# Patient Record
Sex: Female | Born: 1945 | Race: White | Hispanic: No | Smoking: Former smoker
Health system: Southern US, Community
[De-identification: ages and names within clinical notes are randomized; demographics above are authoritative.]

## PROBLEM LIST (undated history)

## (undated) DIAGNOSIS — Z83518 Family history of other specified eye disorder: Secondary | ICD-10-CM

## (undated) DIAGNOSIS — E7800 Pure hypercholesterolemia, unspecified: Secondary | ICD-10-CM

## (undated) DIAGNOSIS — C182 Malignant neoplasm of ascending colon: Principal | ICD-10-CM

## (undated) DIAGNOSIS — F329 Major depressive disorder, single episode, unspecified: Secondary | ICD-10-CM

## (undated) DIAGNOSIS — G629 Polyneuropathy, unspecified: Secondary | ICD-10-CM

## (undated) DIAGNOSIS — E119 Type 2 diabetes mellitus without complications: Secondary | ICD-10-CM

## (undated) DIAGNOSIS — J449 Chronic obstructive pulmonary disease, unspecified: Secondary | ICD-10-CM

## (undated) DIAGNOSIS — F419 Anxiety disorder, unspecified: Secondary | ICD-10-CM

## (undated) DIAGNOSIS — E538 Deficiency of other specified B group vitamins: Secondary | ICD-10-CM

## (undated) DIAGNOSIS — D500 Iron deficiency anemia secondary to blood loss (chronic): Secondary | ICD-10-CM

## (undated) DIAGNOSIS — I2699 Other pulmonary embolism without acute cor pulmonale: Secondary | ICD-10-CM

## (undated) DIAGNOSIS — M1990 Unspecified osteoarthritis, unspecified site: Secondary | ICD-10-CM

## (undated) DIAGNOSIS — I10 Essential (primary) hypertension: Secondary | ICD-10-CM

## (undated) DIAGNOSIS — F32A Depression, unspecified: Secondary | ICD-10-CM

## (undated) DIAGNOSIS — C189 Malignant neoplasm of colon, unspecified: Secondary | ICD-10-CM

## (undated) HISTORY — PX: HERNIA REPAIR: SHX51

## (undated) HISTORY — DX: Malignant neoplasm of ascending colon: C18.2

## (undated) HISTORY — DX: Deficiency of other specified B group vitamins: E53.8

## (undated) HISTORY — DX: Iron deficiency anemia secondary to blood loss (chronic): D50.0

## (undated) HISTORY — PX: COLON SURGERY: SHX602

## (undated) HISTORY — DX: Malignant neoplasm of colon, unspecified: C18.9

---

## 2016-06-24 ENCOUNTER — Emergency Department (HOSPITAL_COMMUNITY): Payer: Medicare Other

## 2016-06-24 ENCOUNTER — Inpatient Hospital Stay (HOSPITAL_COMMUNITY)
Admission: EM | Admit: 2016-06-24 | Discharge: 2016-07-03 | DRG: 329 | Disposition: A | Discharge status: Home / Self Care | Payer: Medicare Other | Attending: Internal Medicine | Admitting: Internal Medicine

## 2016-06-24 ENCOUNTER — Encounter (HOSPITAL_COMMUNITY): Payer: Self-pay

## 2016-06-24 DIAGNOSIS — F17210 Nicotine dependence, cigarettes, uncomplicated: Secondary | ICD-10-CM | POA: Diagnosis present

## 2016-06-24 DIAGNOSIS — I2699 Other pulmonary embolism without acute cor pulmonale: Secondary | ICD-10-CM | POA: Diagnosis not present

## 2016-06-24 DIAGNOSIS — F419 Anxiety disorder, unspecified: Secondary | ICD-10-CM | POA: Diagnosis present

## 2016-06-24 DIAGNOSIS — I10 Essential (primary) hypertension: Secondary | ICD-10-CM | POA: Diagnosis not present

## 2016-06-24 DIAGNOSIS — C182 Malignant neoplasm of ascending colon: Secondary | ICD-10-CM | POA: Diagnosis present

## 2016-06-24 DIAGNOSIS — K6389 Other specified diseases of intestine: Secondary | ICD-10-CM

## 2016-06-24 DIAGNOSIS — E785 Hyperlipidemia, unspecified: Secondary | ICD-10-CM | POA: Diagnosis present

## 2016-06-24 DIAGNOSIS — Z01811 Encounter for preprocedural respiratory examination: Secondary | ICD-10-CM

## 2016-06-24 DIAGNOSIS — E7800 Pure hypercholesterolemia, unspecified: Secondary | ICD-10-CM | POA: Diagnosis present

## 2016-06-24 DIAGNOSIS — I2602 Saddle embolus of pulmonary artery with acute cor pulmonale: Secondary | ICD-10-CM | POA: Diagnosis not present

## 2016-06-24 DIAGNOSIS — K5730 Diverticulosis of large intestine without perforation or abscess without bleeding: Secondary | ICD-10-CM | POA: Diagnosis present

## 2016-06-24 DIAGNOSIS — K56609 Unspecified intestinal obstruction, unspecified as to partial versus complete obstruction: Secondary | ICD-10-CM

## 2016-06-24 DIAGNOSIS — R109 Unspecified abdominal pain: Secondary | ICD-10-CM | POA: Diagnosis present

## 2016-06-24 DIAGNOSIS — K5660 Unspecified intestinal obstruction: Secondary | ICD-10-CM | POA: Diagnosis not present

## 2016-06-24 HISTORY — DX: Pure hypercholesterolemia, unspecified: E78.00

## 2016-06-24 HISTORY — DX: Essential (primary) hypertension: I10

## 2016-06-24 LAB — CBC WITH DIFFERENTIAL/PLATELET
BASOS ABS: 0 10*3/uL (ref 0.0–0.1)
Basophils Relative: 0 %
EOS PCT: 0 %
Eosinophils Absolute: 0 10*3/uL (ref 0.0–0.7)
HEMATOCRIT: 37.9 % (ref 36.0–46.0)
Hemoglobin: 12 g/dL (ref 12.0–15.0)
LYMPHS ABS: 0.8 10*3/uL (ref 0.7–4.0)
LYMPHS PCT: 8 %
MCH: 26.1 pg (ref 26.0–34.0)
MCHC: 31.7 g/dL (ref 30.0–36.0)
MCV: 82.6 fL (ref 78.0–100.0)
MONO ABS: 1.3 10*3/uL — AB (ref 0.1–1.0)
MONOS PCT: 13 %
NEUTROS ABS: 8.4 10*3/uL — AB (ref 1.7–7.7)
Neutrophils Relative %: 79 %
PLATELETS: 443 10*3/uL — AB (ref 150–400)
RBC: 4.59 MIL/uL (ref 3.87–5.11)
RDW: 15.4 % (ref 11.5–15.5)
WBC: 10.6 10*3/uL — ABNORMAL HIGH (ref 4.0–10.5)

## 2016-06-24 LAB — URINALYSIS, ROUTINE W REFLEX MICROSCOPIC
Glucose, UA: NEGATIVE mg/dL
Hgb urine dipstick: NEGATIVE
KETONES UR: 15 mg/dL — AB
LEUKOCYTES UA: NEGATIVE
NITRITE: NEGATIVE
PROTEIN: 30 mg/dL — AB
Specific Gravity, Urine: 1.02 (ref 1.005–1.030)
pH: 5 (ref 5.0–8.0)

## 2016-06-24 LAB — COMPREHENSIVE METABOLIC PANEL
ALT: 10 U/L — ABNORMAL LOW (ref 14–54)
ANION GAP: 8 (ref 5–15)
AST: 14 U/L — AB (ref 15–41)
Albumin: 3.7 g/dL (ref 3.5–5.0)
Alkaline Phosphatase: 46 U/L (ref 38–126)
BILIRUBIN TOTAL: 0.9 mg/dL (ref 0.3–1.2)
BUN: 15 mg/dL (ref 6–20)
CALCIUM: 8.3 mg/dL — AB (ref 8.9–10.3)
CO2: 27 mmol/L (ref 22–32)
CREATININE: 0.75 mg/dL (ref 0.44–1.00)
Chloride: 97 mmol/L — ABNORMAL LOW (ref 101–111)
Glucose, Bld: 113 mg/dL — ABNORMAL HIGH (ref 65–99)
Potassium: 3.8 mmol/L (ref 3.5–5.1)
Sodium: 132 mmol/L — ABNORMAL LOW (ref 135–145)
TOTAL PROTEIN: 7 g/dL (ref 6.5–8.1)

## 2016-06-24 LAB — URINE MICROSCOPIC-ADD ON

## 2016-06-24 MED ORDER — HYDROMORPHONE HCL 1 MG/ML IJ SOLN
1.0000 mg | Freq: Once | INTRAMUSCULAR | Status: AC
  Administered 2016-06-24: 1 mg via INTRAVENOUS
  Filled 2016-06-24: qty 1

## 2016-06-24 MED ORDER — IOPAMIDOL (ISOVUE-300) INJECTION 61%
100.0000 mL | Freq: Once | INTRAVENOUS | Status: AC | PRN
  Administered 2016-06-24: 100 mL via INTRAVENOUS

## 2016-06-24 MED ORDER — BENZOCAINE 20 % MT AERO
INHALATION_SPRAY | Freq: Once | OROMUCOSAL | Status: AC
  Administered 2016-06-24: 1 via OROMUCOSAL
  Filled 2016-06-24: qty 57

## 2016-06-24 MED ORDER — SODIUM CHLORIDE 0.9 % IV BOLUS (SEPSIS)
1000.0000 mL | Freq: Once | INTRAVENOUS | Status: AC
  Administered 2016-06-24: 1000 mL via INTRAVENOUS

## 2016-06-24 MED ORDER — ONDANSETRON HCL 4 MG/2ML IJ SOLN
4.0000 mg | Freq: Once | INTRAMUSCULAR | Status: AC
  Administered 2016-06-24: 4 mg via INTRAVENOUS
  Filled 2016-06-24: qty 2

## 2016-06-24 NOTE — ED Notes (Signed)
Episode of emesis at this time.

## 2016-06-24 NOTE — ED Notes (Signed)
Pt's family very upset about wait time.  Pt calm and cooperative.  Daughter states they came here so they would not have to wait like they did in [place] last week.  Advised her of triage process and apologized for delay.

## 2016-06-24 NOTE — ED Triage Notes (Signed)
Pt reports abd pain and cramping x 3 weeks.  Says was treated at [facility] and was told she had an intestinal infection and was given flagyl, cipro, and zofran.  Pt says she took the antibiotics for 7 days.  Pt says she initially had diarrhea when she was seen at [place] but that stopped after taking the antibiotics.  Pt says she had a small BM today but says that was the first one she has had in over a week.

## 2016-06-24 NOTE — ED Notes (Signed)
Pt sitting in waiting area with daughter in no acute distress.  Apologized for delay.

## 2016-06-24 NOTE — ED Provider Notes (Signed)
[facility] DEPT Provider Note   CSN: [id number] Arrival date & time: 06/24/16  1524     History   Chief Complaint Chief Complaint  Patient presents with  . Emesis  . Abdominal Pain    HPI Janet Hanson is a 70 y.o. female.  She presents for evaluation of gradually worse pain and swelling in the abdomen, despite being treated for infection with Flagyl and Cipro. She has completed treatment. She was told that she had an infection after she had a CT scan about 2 weeks ago. She feels like the antibiotics helped her diarrhea and vomiting, but not the pain. She had a single episode of vomiting yesterday. She has only had 1 small bowel movement in the last week. No prior abdominal surgery. She denies change in her chronic cough, back pain, headache, focal weakness or paresthesia. There are no other known modifying factors.  HPI  Past Medical History:  Diagnosis Date  . Hypercholesterolemia   . Hypertension     There are no active problems to display for this patient.   History reviewed. No pertinent surgical history.  OB History    No data available       Home Medications    Prior to Admission medications   Medication Sig Start Date End Date Taking? Authorizing Provider  amLODipine (NORVASC) 5 MG tablet Take 5 mg by mouth daily.   Yes Historical Provider, MD  escitalopram (LEXAPRO) 10 MG tablet Take 10 mg by mouth daily.   Yes Historical Provider, MD  Omega-3 Fatty Acids (FISH OIL) 1200 MG CAPS Take 1 capsule by mouth daily.   Yes Historical Provider, MD  simvastatin (ZOCOR) 40 MG tablet Take 40 mg by mouth daily.   Yes Historical Provider, MD  ciprofloxacin (CIPRO) 500 MG tablet Take 500 mg by mouth 2 (two) times daily. 06/11/16   Historical Provider, MD  metroNIDAZOLE (FLAGYL) 500 MG tablet Take 500 mg by mouth 2 (two) times daily. 06/11/16   Historical Provider, MD    Family History No family history on file.  Social History Social History  Substance Use  Topics  . Smoking status: Current Every Day Smoker  . Smokeless tobacco: Never Used  . Alcohol use No     Allergies   Review of patient's allergies indicates no known allergies.   Review of Systems Review of Systems  All other systems reviewed and are negative.    Physical Exam Updated Vital Signs BP 112/80   Pulse 101   Temp 98.3 F (36.8 C) (Oral)   Resp 18   Ht 5\' 3"  (1.6 m)   Wt 155 lb (70.3 kg)   SpO2 93%   BMI 27.46 kg/m   Physical Exam  Constitutional: She is oriented to person, place, and time. She appears well-developed and well-nourished.  HENT:  Head: Normocephalic and atraumatic.  Right Ear: External ear normal.  Left Ear: External ear normal.  Eyes: Conjunctivae and EOM are normal. Pupils are equal, round, and reactive to light.  Neck: Normal range of motion and phonation normal. Neck supple.  Cardiovascular: Normal rate, regular rhythm and normal heart sounds.   Pulmonary/Chest: Effort normal and breath sounds normal. She exhibits no bony tenderness.  Abdominal: Soft. She exhibits distension. There is tenderness (Moderate, diffuse). There is no guarding.  Questionable firm hernia midline below the umbilicus, which is not reducible  Musculoskeletal: Normal range of motion.  Neurological: She is alert and oriented to person, place, and time. No cranial nerve deficit or  sensory deficit. She exhibits normal muscle tone. Coordination normal.  Skin: Skin is warm, dry and intact.  Psychiatric: She has a normal mood and affect. Her behavior is normal. Judgment and thought content normal.  Nursing note and vitals reviewed.    ED Treatments / Results  Labs (all labs ordered are listed, but only abnormal results are displayed) Labs Reviewed  CBC WITH DIFFERENTIAL/PLATELET - Abnormal; Notable for the following:       Result Value   WBC 10.6 (*)    Platelets 443 (*)    Neutro Abs 8.4 (*)    Monocytes Absolute 1.3 (*)    All other components within normal  limits  COMPREHENSIVE METABOLIC PANEL - Abnormal; Notable for the following:    Sodium 132 (*)    Chloride 97 (*)    Glucose, Bld 113 (*)    Calcium 8.3 (*)    AST 14 (*)    ALT 10 (*)    All other components within normal limits  URINALYSIS, ROUTINE W REFLEX MICROSCOPIC (NOT AT [facility])    EKG  EKG Interpretation None       Radiology Ct Abdomen Pelvis W Contrast  Result Date: 06/24/2016 CLINICAL DATA:  Patient with abdominal pain and cramping for 3 weeks. EXAM: CT ABDOMEN AND PELVIS WITH CONTRAST TECHNIQUE: Multidetector CT imaging of the abdomen and pelvis was performed using the standard protocol following bolus administration of intravenous contrast. CONTRAST:  159mL ISOVUE-300 IOPAMIDOL (ISOVUE-300) INJECTION 61% COMPARISON:  None. FINDINGS: Lower chest: Normal heart size. Scarring and or atelectasis within the lower lobes bilaterally. No pleural effusion. Small hiatal hernia. Hepatobiliary: Liver is normal in size and contour. No focal hepatic lesions identified. Gallbladder is unremarkable. Pancreas: Unremarkable Spleen: Unremarkable Adrenals/Urinary Tract: The adrenal glands are normal. Kidneys enhance symmetrically with contrast. No hydronephrosis. Urinary bladder is unremarkable. Stomach/Bowel: Sigmoid colonic diverticulosis. 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 There is a 5.1 x 3.9 cm annular constricting mass involving the mid aspect of the ascending colon (image 66; series 5). There is an adjacent small foci of gas and small amount fluid within the colonic mesentery (image 52; series 7). There is dilatation of the cecum and upstream small bowel which is fluid and gas filled measuring up to 4 cm. Vascular/Lymphatic: Normal caliber abdominal aorta. Peripheral calcified atherosclerotic plaque. No retroperitoneal lymphadenopathy. Other: Uterus and adnexal structures are unremarkable. Musculoskeletal: Lumbar spine degenerative  changes. No aggressive or acute appearing osseous lesions. IMPRESSION: There is a large annular constricting mass involving the mid aspect of the ascending colon most compatible with colonic carcinoma. There is a small adjacent foci of gas and free fluid raising the possibility of micro perforation. There is marked dilatation of the cecum and small bowel with fluid and gas most compatible with proximal obstruction caused by the large ascending colonic mass. Additionally there is a 6 cm fatty mass involving the colon at the level the hepatic flexure, most suggestive of a lipoma. Aortic vascular calcifications. Sigmoid colonic diverticulosis without evidence for acute diverticulitis. Electronically Signed   By: Lovey Newcomer M.D.   On: 06/24/2016 21:04    Procedures Procedures (including critical care time)  Medications Ordered in ED Medications  sodium chloride 0.9 % bolus 1,000 mL (0 mLs Intravenous Stopped 06/24/16 2019)  HYDROmorphone (DILAUDID) injection 1 mg (1 mg Intravenous Given 06/24/16 1928)  ondansetron (ZOFRAN) injection 4 mg (4 mg Intravenous Given 06/24/16 1928)  iopamidol (ISOVUE-300)  61 % injection 100 mL (100 mLs Intravenous Contrast Given 06/24/16 2039)     Initial Impression / Assessment and Plan / ED Course  I have reviewed the triage vital signs and the nursing notes.  Pertinent labs & imaging results that were available during my care of the patient were reviewed by me and considered in my medical decision making (see chart for details).  Clinical Course  Value Comment By Time  Creatinine: 0.75 (Reviewed) Daleen Bo, MD 08/22 2201    Medications  sodium chloride 0.9 % bolus 1,000 mL (0 mLs Intravenous Stopped 06/24/16 2019)  HYDROmorphone (DILAUDID) injection 1 mg (1 mg Intravenous Given 06/24/16 1928)  ondansetron (ZOFRAN) injection 4 mg (4 mg Intravenous Given 06/24/16 1928)  iopamidol (ISOVUE-300) 61 % injection 100 mL (100 mLs Intravenous Contrast Given 06/24/16 2039)      Patient Vitals for the past 24 hrs:  BP Temp Temp src Pulse Resp SpO2 Height Weight  06/24/16 2000 112/80 - - 101 18 93 % - -  06/24/16 1930 99/68 - - 115 20 93 % - -  06/24/16 1533 - - - - - - 5\' 3"  (1.6 m) 155 lb (70.3 kg)  06/24/16 1532 133/74 98.3 F (36.8 C) Oral 117 20 97 % - -   Documentation received from [facility], CT scan on 06/24/2016 revealed asendingcolonic abnormality, suspicious for "underlying malignancy". Direct visualization, was recommended.  21:50- discussed with general surgery, Dr. Rosana Hoes, he will evaluate the patient tomorrow in the hospital. He recommends that the patient be admitted to the hospitalist and have an NG placed, for decompression.  10:02 PM Reevaluation with update and discussion. After initial assessment and treatment, an updated evaluation reveals no additional complaints. Findings discussed with patient and family member, all questions answered. Janet Hanson   10:07 PM-Consult complete with Dr. Marin Comment. Patient case explained and discussed. He agrees to admit patient for further evaluation and treatment. 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 No evidence for acute infectious process at this time. Microperforation, you the mass, possibly present, does not require urgent surgical intervention at this time.  Nursing Notes Reviewed/ Care Coordinated, and agree without changes. Applicable Imaging Reviewed.  Interpretation of Laboratory Data incorporated into ED treatment   Plan: Admit  New Prescriptions New Prescriptions   No medications on file     Daleen Bo, MD 06/24/16 2229

## 2016-06-24 NOTE — H&P (Signed)
History and Physical    Janet Hanson [id number] DOB: 06-27-1946 DOA: 06/24/2016  Referring MD/NP/PA: Christ Kick, MD PCP: No PCP Per Patient  Outpatient Specialists:   Patient coming from: home  Chief Complaint: abdominal pain and vomiting  HPI: Janet Hanson is a 70 y.o. female who presents to the emergency department with complaints of worsening abdominal pain. She was told that she had an infection about 2 weeks ago, and was given Flagyl and Cipro. She feels as though the antibiotics helped her diarrhea and vomiting, though the pain has persisted. She reports having only had a single bowel movement in the past week. She denies any other new onset symptoms.  ED Course: while in the ED, CBC, CMP, were largely unremarkable. CT scan showed showed a large annular constricting mass involving mid aspect of ascending colon most compatible with colonic carcinoma. Fortunately, no metastasis to the liver or surrounding lymph nodes was noted. Dilatation of the cecum and small bowel with fluid and gas, compatable with proximal obstruction was also noted. General surgery consult was called. Patient will be referred for GI consult and further evaluation.   Review of Systems: As per HPI otherwise 10 point review of systems negative.    Past Medical History:  Diagnosis Date  . Hypercholesterolemia   . Hypertension     History reviewed. No pertinent surgical history.   reports that she has been smoking.  She has never used smokeless tobacco. She reports that she does not drink alcohol or use drugs.  No Known Allergies  No family history on file.  Prior to Admission medications   Medication Sig Start Date End Date Taking? Authorizing Provider  amLODipine (NORVASC) 5 MG tablet Take 5 mg by mouth daily.   Yes Historical Provider, MD  escitalopram (LEXAPRO) 10 MG tablet Take 10 mg by mouth daily.   Yes Historical Provider, MD  Omega-3 Fatty Acids (FISH OIL) 1200 MG CAPS Take 1 capsule  by mouth daily.   Yes Historical Provider, MD  simvastatin (ZOCOR) 40 MG tablet Take 40 mg by mouth daily.   Yes Historical Provider, MD  ciprofloxacin (CIPRO) 500 MG tablet Take 500 mg by mouth 2 (two) times daily. 06/11/16   Historical Provider, MD  metroNIDAZOLE (FLAGYL) 500 MG tablet Take 500 mg by mouth 2 (two) times daily. 06/11/16   Historical Provider, MD    Physical Exam: Vitals:   06/24/16 1532 06/24/16 1533 06/24/16 1930 06/24/16 2000  BP: 133/74  99/68 112/80  Pulse: 117  115 101  Resp: 20  20 18   Temp: 98.3 F (36.8 C)     TempSrc: Oral     SpO2: 97%  93% 93%  Weight:  70.3 kg (155 lb)    Height:  5\' 3"  (1.6 m)     Constitutional: NAD, calm, comfortable Vitals:   06/24/16 1532 06/24/16 1533 06/24/16 1930 06/24/16 2000  BP: 133/74  99/68 112/80  Pulse: 117  115 101  Resp: 20  20 18   Temp: 98.3 F (36.8 C)     TempSrc: Oral     SpO2: 97%  93% 93%  Weight:  70.3 kg (155 lb)    Height:  5\' 3"  (1.6 m)     Eyes: PERRL, lids and conjunctivae normal ENMT: Mucous membranes are moist. Posterior pharynx clear of any exudate or lesions.Normal dentition.  Neck: normal, supple, no masses, no thyromegaly Respiratory: clear to auscultation bilaterally, no wheezing, no crackles. Normal respiratory effort. No accessory muscle use.  Cardiovascular:  Regular rate and rhythm, no murmurs / rubs / gallops. No extremity edema. 2+ pedal pulses. No carotid bruits.  Abdomen: no tenderness, no masses palpated. No hepatosplenomegaly. Bowel sounds positive Musculoskeletal: no clubbing / cyanosis. No joint deformity upper and lower extremities. Good ROM, no contractures. Normal muscle tone.  Skin: no rashes, lesions, ulcers. No induration Neurologic: CN 2-12 grossly intact. Sensation intact, DTR normal. Strength 5/5 in all 4.  Psychiatric: Normal judgment and insight. Alert and oriented x 3. Normal mood.   Labs on Admission: I have personally reviewed following labs and imaging  studies  CBC:  Recent Labs Lab 06/24/16 1543  WBC 10.6*  NEUTROABS 8.4*  HGB 12.0  HCT 37.9  MCV 82.6  PLT 123456*   Basic Metabolic Panel:  Recent Labs Lab 06/24/16 1543  NA 132*  K 3.8  CL 97*  CO2 27  GLUCOSE 113*  BUN 15  CREATININE 0.75  CALCIUM 8.3*    Liver Function Tests:  Recent Labs Lab 06/24/16 1543  AST 14*  ALT 10*  ALKPHOS 46  BILITOT 0.9  PROT 7.0  ALBUMIN 3.7   Radiological Exams on Admission: Ct Abdomen Pelvis W Contrast  Result Date: 06/24/2016 CLINICAL DATA:  Patient with abdominal pain and cramping for 3 weeks. EXAM: CT ABDOMEN AND PELVIS WITH CONTRAST TECHNIQUE: Multidetector CT imaging of the abdomen and pelvis was performed using the standard protocol following bolus administration of intravenous contrast. CONTRAST:  181mL ISOVUE-300 IOPAMIDOL (ISOVUE-300) INJECTION 61% COMPARISON:  None. FINDINGS: Lower chest: Normal heart size. Scarring and or atelectasis within the lower lobes bilaterally. No pleural effusion. Small hiatal hernia. Hepatobiliary: Liver is normal in size and contour. No focal hepatic lesions identified. Gallbladder is unremarkable. Pancreas: Unremarkable Spleen: Unremarkable Adrenals/Urinary Tract: The adrenal glands are normal. Kidneys enhance symmetrically with contrast. No hydronephrosis. Urinary bladder is unremarkable. Stomach/Bowel: Sigmoid colonic diverticulosis. 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 There is a 5.1 x 3.9 cm annular constricting mass involving the mid aspect of the ascending colon (image 66; series 5). There is an adjacent small foci of gas and small amount fluid within the colonic mesentery (image 52; series 7). There is dilatation of the cecum and upstream small bowel which is fluid and gas filled measuring up to 4 cm. Vascular/Lymphatic: Normal caliber abdominal aorta. Peripheral calcified atherosclerotic plaque. No retroperitoneal lymphadenopathy.  Other: Uterus and adnexal structures are unremarkable. Musculoskeletal: Lumbar spine degenerative changes. No aggressive or acute appearing osseous lesions. IMPRESSION: There is a large annular constricting mass involving the mid aspect of the ascending colon most compatible with colonic carcinoma. There is a small adjacent foci of gas and free fluid raising the possibility of micro perforation. There is marked dilatation of the cecum and small bowel with fluid and gas most compatible with proximal obstruction caused by the large ascending colonic mass. Additionally there is a 6 cm fatty mass involving the colon at the level the hepatic flexure, most suggestive of a lipoma. Aortic vascular calcifications. Sigmoid colonic diverticulosis without evidence for acute diverticulitis. Electronically Signed   By: Lovey Newcomer M.D.   On: 06/24/2016 21:04   Assessment/Plan  1. Colonic mass, suspicious for carcinoma. Large mass noted in ascending colon on CT A/P. She has not had any prior colonoscopy, and has not had any symptoms prior to this hospitalization. Patient recently had a scan in [place], will obtain records to evaluate description of prior mass.  No evidence  of metastasis to liver or surrounding lymph nodes. Will consult to GI for colonoscopy and biopsy.  Will also consult general surgery. 2. SBO. CT a/p findings consistent with proximal obstruction caused by ascending colonic mass. Will place NG tube to allow for NG tube. Will keep NPO. GI will be consulted for colonoscopy. 3. Sigmoid diverticulosis without evidence for acute diverticulitis. 4. Tobacco use. Will provide nicotine patch PRN. 5. Axiety:  Will give PRN Valium.   DVT prophylaxis: Heparin Code Status: Full Family Communication: discussed with patient and family present at bedside. Disposition Plan: admit as inpatient. Discharge home once improved Consults called: General surgery, GI Admission status: admit as inpatient.   Ocie Doyne, MD  FACP [facility]  If 7PM-7AM, please contact night-coverage [email or web address] Password [email or web address]  06/24/2016, 10:23 PM   By signing my name below, I, Delene Ruffini, attest that this documentation has been prepared under the direction and in the presence of Orvan Falconer, MD. Electronically Signed: Delene Ruffini, Scribe 06/24/16 10:30pm

## 2016-06-24 NOTE — ED Notes (Signed)
Patient returned from radiology

## 2016-06-25 ENCOUNTER — Encounter (HOSPITAL_COMMUNITY): Payer: Self-pay | Admitting: *Deleted

## 2016-06-25 ENCOUNTER — Inpatient Hospital Stay (HOSPITAL_COMMUNITY): Payer: Medicare Other

## 2016-06-25 DIAGNOSIS — K5660 Unspecified intestinal obstruction: Secondary | ICD-10-CM

## 2016-06-25 DIAGNOSIS — K6389 Other specified diseases of intestine: Secondary | ICD-10-CM

## 2016-06-25 LAB — SURGICAL PCR SCREEN
MRSA, PCR: NEGATIVE
STAPHYLOCOCCUS AUREUS: NEGATIVE

## 2016-06-25 MED ORDER — SIMVASTATIN 20 MG PO TABS: 40.0000 mg | ORAL_TABLET | Freq: Every day | ORAL | Status: DC

## 2016-06-25 MED ORDER — HEPARIN SODIUM (PORCINE) 5000 UNIT/ML IJ SOLN
5000.0000 [IU] | Freq: Three times a day (TID) | INTRAMUSCULAR | Status: DC
  Administered 2016-06-25: 5000 [IU] via SUBCUTANEOUS
  Filled 2016-06-25: qty 1

## 2016-06-25 MED ORDER — MORPHINE SULFATE (PF) 2 MG/ML IV SOLN
1.0000 mg | INTRAVENOUS | Status: DC | PRN
  Administered 2016-06-25 – 2016-06-28 (×11): 1 mg via INTRAVENOUS
  Filled 2016-06-25 (×11): qty 1

## 2016-06-25 MED ORDER — ATORVASTATIN CALCIUM 20 MG PO TABS: 20.0000 mg | ORAL_TABLET | Freq: Every day | ORAL | Status: DC

## 2016-06-25 MED ORDER — CETYLPYRIDINIUM CHLORIDE 0.05 % MT LIQD
7.0000 mL | Freq: Two times a day (BID) | OROMUCOSAL | Status: DC
  Administered 2016-06-25 (×2): 7 mL via OROMUCOSAL

## 2016-06-25 MED ORDER — ENOXAPARIN SODIUM 40 MG/0.4ML ~~LOC~~ SOLN
40.0000 mg | SUBCUTANEOUS | Status: DC
  Administered 2016-06-25 – 2016-06-26 (×2): 40 mg via SUBCUTANEOUS
  Filled 2016-06-25 (×2): qty 0.4

## 2016-06-25 MED ORDER — METOPROLOL TARTRATE 5 MG/5ML IV SOLN
5.0000 mg | Freq: Two times a day (BID) | INTRAVENOUS | Status: DC
  Administered 2016-06-25 – 2016-07-03 (×15): 5 mg via INTRAVENOUS
  Filled 2016-06-25 (×15): qty 5

## 2016-06-25 MED ORDER — DEXTROSE-NACL 5-0.9 % IV SOLN
INTRAVENOUS | Status: DC
  Administered 2016-06-25 – 2016-06-26 (×5): via INTRAVENOUS

## 2016-06-25 MED ORDER — CHLORHEXIDINE GLUCONATE 0.12 % MT SOLN
15.0000 mL | Freq: Two times a day (BID) | OROMUCOSAL | Status: DC
  Administered 2016-06-25 – 2016-06-29 (×7): 15 mL via OROMUCOSAL
  Filled 2016-06-25 (×9): qty 15

## 2016-06-25 MED ORDER — DIAZEPAM 5 MG PO TABS: 5.0000 mg | ORAL_TABLET | Freq: Three times a day (TID) | ORAL | Status: DC | PRN

## 2016-06-25 MED ORDER — CALCIUM CARBONATE ANTACID 500 MG PO CHEW
2.0000 | CHEWABLE_TABLET | Freq: Three times a day (TID) | ORAL | Status: DC | PRN
  Filled 2016-06-25: qty 2

## 2016-06-25 MED ORDER — AMLODIPINE BESYLATE 5 MG PO TABS: 5.0000 mg | ORAL_TABLET | Freq: Every day | ORAL | Status: DC

## 2016-06-25 MED ORDER — ESCITALOPRAM OXALATE 10 MG PO TABS: 10.0000 mg | ORAL_TABLET | Freq: Every day | ORAL | Status: DC

## 2016-06-25 NOTE — Consult Note (Signed)
Reason for Consult:Colonic mass Referring Physician: Hospitalist Patient at [facility] in [place]. She is seen every 6 months.  She lives in [place]  and lives by herself. Her husband is deceased.  Retired from [employer]. Janet Hanson is an 70 y.o. female.  HPI:  Admitted thru the ED last night. She c/o abdominal cramps which she describes as all over her abdomen. She has been sick for 3 weeks.  She also c/o diarrhea and vomiting. Seen at [facility]  07/11/2016. She said she underwent a CXR and CT and was told she had an intestinal infection. She was given Cipro and Flagyl x 7 days. Her symptoms did not get better. She presented to the ED at [facility]. She says she has lost about 7 pounds. She has not been able to eat. CT scan here at [facility] revealed a colonic mass.  She says she cannot have a BM. Last BM was yesterday and very small. She denies any rectal bleeding.  She does not have an appetite. NG tube is in place.  No family hx of colon cancer. She has never had a colonoscopy.    Past Medical History:  Diagnosis Date  . Hypercholesterolemia   . Hypertension     History reviewed. No pertinent surgical history.  History reviewed. No pertinent family history.  Social History:  reports that she has been smoking.  She has never used smokeless tobacco. She reports that she does not drink alcohol or use drugs.  Allergies: No Known Allergies  Medications: I have reviewed the patient's current medications.  Results for orders placed or performed during the hospital encounter of 06/24/16 (from the past 48 hour(s))  CBC with Differential     Status: Abnormal   Collection Time: 06/24/16  3:43 PM  Result Value Ref Range   WBC 10.6 (H) 4.0 - 10.5 K/uL   RBC 4.59 3.87 - 5.11 MIL/uL   Hemoglobin 12.0 12.0 - 15.0 g/dL   HCT 37.9 36.0 - 46.0 %   MCV 82.6 78.0 - 100.0 fL   MCH 26.1 26.0 - 34.0 pg   MCHC 31.7 30.0 - 36.0 g/dL   RDW 15.4 11.5 - 15.5 %   Platelets 443 (H) 150 - 400  K/uL   Neutrophils Relative % 79 %   Neutro Abs 8.4 (H) 1.7 - 7.7 K/uL   Lymphocytes Relative 8 %   Lymphs Abs 0.8 0.7 - 4.0 K/uL   Monocytes Relative 13 %   Monocytes Absolute 1.3 (H) 0.1 - 1.0 K/uL   Eosinophils Relative 0 %   Eosinophils Absolute 0.0 0.0 - 0.7 K/uL   Basophils Relative 0 %   Basophils Absolute 0.0 0.0 - 0.1 K/uL  Comprehensive metabolic panel     Status: Abnormal   Collection Time: 06/24/16  3:43 PM  Result Value Ref Range   Sodium 132 (L) 135 - 145 mmol/L   Potassium 3.8 3.5 - 5.1 mmol/L   Chloride 97 (L) 101 - 111 mmol/L   CO2 27 22 - 32 mmol/L   Glucose, Bld 113 (H) 65 - 99 mg/dL   BUN 15 6 - 20 mg/dL   Creatinine, Ser 0.75 0.44 - 1.00 mg/dL   Calcium 8.3 (L) 8.9 - 10.3 mg/dL   Total Protein 7.0 6.5 - 8.1 g/dL   Albumin 3.7 3.5 - 5.0 g/dL   AST 14 (L) 15 - 41 U/L   ALT 10 (L) 14 - 54 U/L   Alkaline Phosphatase 46 38 - 126 U/L  Total Bilirubin 0.9 0.3 - 1.2 mg/dL   GFR calc non Af Amer >60 >60 mL/min   GFR calc Af Amer >60 >60 mL/min    Comment: (NOTE) The eGFR has been calculated using the CKD EPI equation. This calculation has not been validated in all clinical situations. eGFR's persistently <60 mL/min signify possible Chronic Kidney Disease.    Anion gap 8 5 - 15  Urinalysis, Routine w reflex microscopic (not at [facility])     Status: Abnormal   Collection Time: 06/24/16 10:26 PM  Result Value Ref Range   Color, Urine AMBER (A) YELLOW    Comment: BIOCHEMICALS MAY BE AFFECTED BY COLOR   APPearance CLEAR CLEAR   Specific Gravity, Urine 1.020 1.005 - 1.030   pH 5.0 5.0 - 8.0   Glucose, UA NEGATIVE NEGATIVE mg/dL   Hgb urine dipstick NEGATIVE NEGATIVE   Bilirubin Urine SMALL (A) NEGATIVE   Ketones, ur 15 (A) NEGATIVE mg/dL   Protein, ur 30 (A) NEGATIVE mg/dL   Nitrite NEGATIVE NEGATIVE   Leukocytes, UA NEGATIVE NEGATIVE  Urine microscopic-add on     Status: Abnormal   Collection Time: 06/24/16 10:26 PM  Result Value Ref Range   Squamous  Epithelial / LPF 6-30 (A) NONE SEEN   WBC, UA 0-5 0 - 5 WBC/hpf   RBC / HPF 0-5 0 - 5 RBC/hpf   Bacteria, UA RARE (A) NONE SEEN   Crystals CA OXALATE CRYSTALS (A) NEGATIVE    Ct Abdomen Pelvis W Contrast  Result Date: 06/24/2016 CLINICAL DATA:  Patient with abdominal pain and cramping for 3 weeks. EXAM: CT ABDOMEN AND PELVIS WITH CONTRAST TECHNIQUE: Multidetector CT imaging of the abdomen and pelvis was performed using the standard protocol following bolus administration of intravenous contrast. CONTRAST:  151m ISOVUE-300 IOPAMIDOL (ISOVUE-300) INJECTION 61% COMPARISON:  None. FINDINGS: Lower chest: Normal heart size. Scarring and or atelectasis within the lower lobes bilaterally. No pleural effusion. Small hiatal hernia. Hepatobiliary: Liver is normal in size and contour. No focal hepatic lesions identified. Gallbladder is unremarkable. Pancreas: Unremarkable Spleen: Unremarkable Adrenals/Urinary Tract: The adrenal glands are normal. Kidneys enhance symmetrically with contrast. No hydronephrosis. Urinary bladder is unremarkable. Stomach/Bowel: Sigmoid colonic diverticulosis. 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 There is a 5.1 x 3.9 cm annular constricting mass involving the mid aspect of the ascending colon (image 66; series 5). There is an adjacent small foci of gas and small amount fluid within the colonic mesentery (image 52; series 7). There is dilatation of the cecum and upstream small bowel which is fluid and gas filled measuring up to 4 cm. Vascular/Lymphatic: Normal caliber abdominal aorta. Peripheral calcified atherosclerotic plaque. No retroperitoneal lymphadenopathy. Other: Uterus and adnexal structures are unremarkable. Musculoskeletal: Lumbar spine degenerative changes. No aggressive or acute appearing osseous lesions. IMPRESSION: There is a large annular constricting mass involving the mid aspect of the ascending colon most  compatible with colonic carcinoma. There is a small adjacent foci of gas and free fluid raising the possibility of micro perforation. There is marked dilatation of the cecum and small bowel with fluid and gas most compatible with proximal obstruction caused by the large ascending colonic mass. Additionally there is a 6 cm fatty mass involving the colon at the level the hepatic flexure, most suggestive of a lipoma. Aortic vascular calcifications. Sigmoid colonic diverticulosis without evidence for acute diverticulitis. Electronically Signed   By: DLovey NewcomerM.D.   On: 06/24/2016  21:04    ROS Blood pressure 120/67, pulse 96, temperature 98.3 F (36.8 C), temperature source Oral, resp. rate 16, height _0  (1.6 m), weight 157 lb 14.4 oz (71.6 kg), SpO2 93 %. Physical Exam Alert and oriented. Skin warm and dry. Oral mucosa is moist.   . Sclera anicteric, conjunctivae is pink. Thyroid not enlarged. No cervical lymphadenopathy. Lungs clear. Heart regular rate and rhythm.  Abdomen is soft. Bowel sounds are positive( sluggish). No hepatomegaly. No abdominal masses felt. Diffuse abdominal tenderness.  No edema to lower extremities. NG tube in place.  Assessment/Plan: Colonic mass.  I discussed with Dr. Laural Golden. Recommend: Surgical consult. ER records from [facility] (requested) Recommend IV Cipro and Flagyl.   Brylei Pedley W 06/25/2016, 7:41 AM

## 2016-06-25 NOTE — Care Management Important Message (Signed)
Important Message  Patient Details  Name: Janet Hanson MRN: [id number] Date of Birth: May 12, 1946   Medicare Important Message Given:  Yes    Sherald Barge, RN 06/25/2016, 10:01 AM

## 2016-06-25 NOTE — Anesthesia Preprocedure Evaluation (Addendum)
Anesthesia Evaluation  Patient identified by MRN, date of birth, ID band Patient awake    Reviewed: Allergy & Precautions, NPO status , Patient's Chart, lab work & pertinent test results  Airway Mallampati: III  TM Distance: >3 FB Neck ROM: Full    Dental  (+) Missing, Poor Dentition   Pulmonary Current Smoker,    breath sounds clear to auscultation + decreased breath sounds      Cardiovascular hypertension, Pt. on medications Normal cardiovascular exam Rhythm:Regular Rate:Normal     Neuro/Psych Anxiety negative neurological ROS     GI/Hepatic Neg liver ROS, Obstructing ascending colon mass   Endo/Other  Hypercholesterolemia  Renal/GU negative Renal ROS  negative genitourinary   Musculoskeletal negative musculoskeletal ROS (+)   Abdominal   Peds  Hematology negative hematology ROS (+)   Anesthesia Other Findings   Reproductive/Obstetrics                            Lab Results  Component Value Date   WBC 10.6 (H) 06/24/2016   HGB 12.0 06/24/2016   HCT 37.9 06/24/2016   MCV 82.6 06/24/2016   PLT 443 (H) 06/24/2016     Chemistry      Component Value Date/Time   NA 132 (L) 06/24/2016 1543   K 3.8 06/24/2016 1543   CL 97 (L) 06/24/2016 1543   CO2 27 06/24/2016 1543   BUN 15 06/24/2016 1543   CREATININE 0.75 06/24/2016 1543      Component Value Date/Time   CALCIUM 8.3 (L) 06/24/2016 1543   ALKPHOS 46 06/24/2016 1543   AST 14 (L) 06/24/2016 1543   ALT 10 (L) 06/24/2016 1543   BILITOT 0.9 06/24/2016 1543     EKG: 06/25/2016 Sinus tachycardia, poor r wave progression in V leads.  Anesthesia Physical Anesthesia Plan  ASA: II  Anesthesia Plan: General   Post-op Pain Management:    Induction: Intravenous  Airway Management Planned: Oral ETT  Additional Equipment:   Intra-op Plan:   Post-operative Plan: Extubation in OR  Informed Consent: I have reviewed the patients  History and Physical, chart, labs and discussed the procedure including the risks, benefits and alternatives for the proposed anesthesia with the patient or authorized representative who has indicated his/her understanding and acceptance.   Dental advisory given  Plan Discussed with: Anesthesiologist, CRNA and Surgeon  Anesthesia Plan Comments:         Anesthesia Quick Evaluation

## 2016-06-25 NOTE — Care Management Note (Signed)
Case Management Note  Patient Details  Name: Janet Hanson MRN: [id number] Date of Birth: 12/16/1945  Subjective/Objective:                  Patient is from home. She lives alone and is ind with ADL's. She has no DME PTA. She has PCP, drives herself to appointments. She has insurance with drug coverage and has no difficulty affording medications. She plans to return home with self care.   Action/Plan: No CM needs anticipated.  Expected Discharge Date:  07/01/16               Expected Discharge Plan:  Home/Self Care  In-House Referral:  NA  Discharge planning Services  CM Consult  Post Acute Care Choice:  NA Choice offered to:  NA  DME Arranged:    DME Agency:     HH Arranged:    HH Agency:     Status of Service:  Completed, signed off  If discussed at [employer] of Stay Meetings, dates discussed:    Additional Comments:  Sherald Barge, RN 06/25/2016, 10:02 AM

## 2016-06-25 NOTE — Consult Note (Addendum)
SURGICAL CONSULTATION NOTE (initial)  HISTORY OF PRESENT ILLNESS (HPI):  70 y.o. female presented with worsening of abdominal pain, distention, and nausea and vomiting x 2-3 days. 3 weeks ago, she reportedly presented to [facility] with diarrhea and vomiting, for which a CT of her abdomen was performed, after which she was discharged home with Cipro and Flagyl for gastroenteritis. Since then, she continued to have diarrhea x 2 days and then stopped passing diarrhea, flatus, or bowel movements entirely and began having worsening abdominal pain and distention. 2 days ago, she again began vomiting and presented to [facility] ED. CT was performed, NG tube was inserted for decompression, and patient reports resolution of abdominal pain and nausea with less distention. Patient denies ever having had a colonoscopy. Despite smoking, patient denies CP or SOB with exertion, such as riding a bicycle with her grandchildren.   Surgery is consulted by ED physician Dr. Eulis Foster and medical physician Dr. Marin Comment in this context for evaluation and management of obstructing Right colon mass.  PAST MEDICAL HISTORY (PMH):  Past Medical History:  Diagnosis Date  . Hypercholesterolemia   . Hypertension      PAST SURGICAL HISTORY ([place]):  History reviewed. No pertinent surgical history.   MEDICATIONS:  Prior to Admission medications   Medication Sig Start Date End Date Taking? Authorizing Provider  amLODipine (NORVASC) 5 MG tablet Take 5 mg by mouth daily.   Yes Historical Provider, MD  escitalopram (LEXAPRO) 10 MG tablet Take 10 mg by mouth daily.   Yes Historical Provider, MD  Omega-3 Fatty Acids (FISH OIL) 1200 MG CAPS Take 1 capsule by mouth daily.   Yes Historical Provider, MD  simvastatin (ZOCOR) 40 MG tablet Take 40 mg by mouth daily.   Yes Historical Provider, MD  ciprofloxacin (CIPRO) 500 MG tablet Take 500 mg by mouth 2 (two) times daily. 06/11/16   Historical Provider, MD  metroNIDAZOLE (FLAGYL) 500 MG tablet  Take 500 mg by mouth 2 (two) times daily. 06/11/16   Historical Provider, MD     ALLERGIES:  No Known Allergies   SOCIAL HISTORY:  Social History   Social History  . Marital status: Widowed    Spouse name: N/A  . Number of children: N/A  . Years of education: N/A   Occupational History  . Not on file.   Social History Main Topics  . Smoking status: Current Every Day Smoker    Packs/day: 0.50    Years: 48.00    Types: Cigarettes  . Smokeless tobacco: Never Used  . Alcohol use No  . Drug use: No  . Sexual activity: Not on file   Other Topics Concern  . Not on file   Social History Narrative  . No narrative on file    The patient currently resides (home / rehab facility / nursing home): Home  The patient normally is (ambulatory / bedbound): Ambulatory   FAMILY HISTORY:  History reviewed. No pertinent family history, including no known history of GI/colon cancer.  REVIEW OF SYSTEMS:  Constitutional: denies weight loss, fever, chills, or sweats  Eyes: denies any other vision changes, history of eye injury  ENT: denies sore throat, hearing problems  Respiratory: denies shortness of breath, wheezing  Cardiovascular: denies chest pain, palpitations  Gastrointestinal: abdominal pain, distention, N/V, and bowel function as per HPI Musculoskeletal: denies any other joint pains or cramps  Skin: denies any other rashes or skin discolorations  Neurological: denies any other headache, dizziness, weakness  Psychiatric: denies any other depression,  anxiety   All other review of systems were negative   VITAL SIGNS:  Temp:  [97.3 F (36.3 C)-98.3 F (36.8 C)] 98.3 F (36.8 C) (08/23 0500) Pulse Rate:  [96-117] 96 (08/23 0500) Resp:  [15-20] 16 (08/23 0500) BP: (99-136)/(67-80) 120/67 (08/23 0500) SpO2:  [90 %-97 %] 93 % (08/23 0500) Weight:  [70.3 kg (155 lb)-71.6 kg (157 lb 14.4 oz)] 71.6 kg (157 lb 14.4 oz) (08/23 0004)     Height: 5\' 3"  (160 cm) Weight: 71.6 kg (157 lb  14.4 oz) BMI (Calculated): 28   INTAKE/OUTPUT:  This shift: No intake/output data recorded.  Last 2 shifts: @IOLAST2SHIFTS @   PHYSICAL EXAM:  Constitutional:  -- Normal overweight body habitus  -- Awake, alert, and oriented x3  Eyes:  -- Pupils equally round and reactive to light  -- No scleral icterus  Ear, nose, and throat:  -- No jugular venous distension  Pulmonary:  -- No crackles  -- Equal breath sounds bilaterally  Cardiovascular:  -- S1, S2 present  -- No pericardial rubs Gastrointestinal:  -- Abdomen soft, nontender, mildly distended, no guarding/rebound  -- No abdominal masses appreciated, pulsatile or otherwise  Musculoskeletal / Integumentary:  -- Wounds or skin discoloration: None appreciated -- Extremities: B/L UE and LE FROM, hands and feet warm Neurologic:  -- Motor function: intact and symmetric -- Sensation: intact and symmetric   Labs:  CBC:  Lab Results  Component Value Date   WBC 10.6 (H) 06/24/2016   RBC 4.59 06/24/2016   BMP:  Lab Results  Component Value Date   GLUCOSE 113 (H) 06/24/2016   CO2 27 06/24/2016   BUN 15 06/24/2016   CREATININE 0.75 06/24/2016   CALCIUM 8.3 (L) 06/24/2016     Imaging studies:  CT Abdomen and Pelvis with Contrast (06/24/2016) There is a 5.1 x 3.9 cm annular constricting mass involving the mid aspect of the ascending colon (image 66; series 5), most compatible with carcinoma of the colon. There is a small adjacent foci of gas and small amount fluid within the colonic mesentery (image 52; series 7), suggesting microperforation into the mesentary. There is dilatation of the cecum and upstream small bowel which is fluid and gas filled measuring up to 4 cm. 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 There is sigmoid colonic diverticulosis without evidence of diverticulitis.  Assessment/Plan:  70 y.o. female with obstructing mid-ascending colon mass (likely  malignancy) with contained erosion/microperforation into the colonic mesentery, complicated by pertinent comorbidities including tobacco abuse, HTN, HLD, and limited continuity of prior medical care.   - NPO, IVF   - nasogastric decompression   - will plan for partial colectomy this Friday, 8/25  - meanwhile, requires medical risk stratification and optimization  - all risks, benefits, and alternatives to colectomy, possible colostomy, discussed with the patient, who elects to proceed, and informed consent was obtained accordingly  - medical management of comorbidities as per medical team  - DVT prophylaxis (Lovenox in context of likely malignancy)  - smoking cessation advised and discussed  - please call if any questions  All of the above findings and recommendations were discussed with the patient, and all of her questions were answered to her expressed satisfaction.  Thank you for the opportunity to participate in this patient's care.   -- Marilynne Drivers Rosana Hoes, MD, [place]: [place] [facility] Office: [phone]

## 2016-06-25 NOTE — Progress Notes (Addendum)
PROGRESS NOTE    Janet Hanson  [id number] DOB: [birth year] DOA: 06/24/2016 PCP: No PCP Per Patient     Brief Narrative:  [place] old woman admitted on April 22 from home due to abdominal pain, nausea, vomiting. She was found to have an obstructing colonic mass of the ascending colon. Has been seen in consultation by GI and surgery with plans for surgical intervention on Friday.   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 -Keep NG in place prior to surgery. -Low risk for surgery, only significant medical history appears to be hypertension which is currently well controlled on Norvasc. Given her need to remain NPO with an NG to suction, will discontinue Norvasc and start on IV Lopressor. -Chest x-ray with only mild bronchitic changes, EKG without acute ischemic abnormalities. -Will need staging CTs, possibly PET scan (CT scanner at [facility] is currently down). We will request oncology consultation as will need follow-up pending pathology report. -Hold all oral meds for now.  Anxiety -Hold Lexapro, diazepam given nothing by mouth state.   Hypertension -Well-controlled, hold amlodipine given nothing by mouth state.   DVT prophylaxis: Lovenox Code Status: Full Code Family Communication: Daughter at bedside updated on plan of care and all questions answered Disposition Plan: To be determined. For OR on Friday  Consultants:   Surgery  GI  Procedures:   None  Antimicrobials:   None    Subjective: Feels well, wants to know when she can eat.  Objective: Vitals:   06/24/16 2330 06/25/16 0004 06/25/16 0500 06/25/16 1440  BP: 124/78 136/70 120/67 107/64  Pulse: 106 (!) 107 96 (!) 103  Resp: 15 16 16 16   Temp:  97.3 F (36.3 C) 98.3 F (36.8 C) 98.5 F (36.9 C)  TempSrc:  Oral Oral Oral  SpO2: 90% 94% 93% 92%  Weight:  71.6 kg (157 lb 14.4 oz)      Height:  5\' 3"  (1.6 m)      Intake/Output Summary (Last 24 hours) at 06/25/16 1611 Last data filed at 06/25/16 1100  Gross per 24 hour  Intake              586 ml  Output              700 ml  Net             -114 ml   Filed Weights   06/24/16 1533 06/25/16 0004  Weight: 70.3 kg (155 lb) 71.6 kg (157 lb 14.4 oz)    Examination:  General exam: Alert, awake, oriented x 3, NG in place. Respiratory system: Clear to auscultation. Respiratory effort normal. Cardiovascular system:RRR. No murmurs, rubs, gallops. Gastrointestinal system: Abdomen is distended,  and nontender. No organomegaly or masses felt. Normal bowel sounds heard. Central nervous system: Alert and oriented. No focal neurological deficits. Extremities: No C/C/E, +pedal pulses Skin: No rashes, lesions or ulcers Psychiatry: Judgement and insight appear normal. Mood & affect appropriate.     Data Reviewed: I have personally reviewed following labs and imaging studies  CBC:  Recent Labs Lab 06/24/16 1543  WBC 10.6*  NEUTROABS 8.4*  HGB 12.0  HCT 37.9  MCV 82.6  PLT 123456*   Basic Metabolic Panel:  Recent Labs Lab 06/24/16 1543  NA 132*  K 3.8  CL 97*  CO2 27  GLUCOSE 113*  BUN 15  CREATININE 0.75  CALCIUM  8.3*   GFR: Estimated Creatinine Clearance: 63.9 mL/min (by C-G formula based on SCr of 0.8 mg/dL). Liver Function Tests:  Recent Labs Lab 06/24/16 1543  AST 14*  ALT 10*  ALKPHOS 46  BILITOT 0.9  PROT 7.0  ALBUMIN 3.7   No results for input(s): LIPASE, AMYLASE in the last 168 hours. No results for input(s): AMMONIA in the last 168 hours. Coagulation Profile: No results for input(s): INR, PROTIME in the last 168 hours. Cardiac Enzymes: No results for input(s): CKTOTAL, CKMB, CKMBINDEX, TROPONINI in the last 168 hours. BNP (last 3 results) No results for input(s): PROBNP in the last 8760 hours. HbA1C: No results for input(s): HGBA1C in the last 72 hours. CBG: No results for  input(s): GLUCAP in the last 168 hours. Lipid Profile: No results for input(s): CHOL, HDL, LDLCALC, TRIG, CHOLHDL, LDLDIRECT in the last 72 hours. Thyroid Function Tests: No results for input(s): TSH, T4TOTAL, FREET4, T3FREE, THYROIDAB in the last 72 hours. Anemia Panel: No results for input(s): VITAMINB12, FOLATE, FERRITIN, TIBC, IRON, RETICCTPCT in the last 72 hours. Urine analysis:    Component Value Date/Time   COLORURINE AMBER (A) 06/24/2016 2226   APPEARANCEUR CLEAR 06/24/2016 2226   LABSPEC 1.020 06/24/2016 2226   PHURINE 5.0 06/24/2016 2226   GLUCOSEU NEGATIVE 06/24/2016 2226   HGBUR NEGATIVE 06/24/2016 2226   BILIRUBINUR SMALL (A) 06/24/2016 2226   KETONESUR 15 (A) 06/24/2016 2226   PROTEINUR 30 (A) 06/24/2016 2226   NITRITE NEGATIVE 06/24/2016 2226   LEUKOCYTESUR NEGATIVE 06/24/2016 2226   Sepsis Labs: @LABRCNTIP (procalcitonin:4,lacticidven:4)  )No results found for this or any previous visit (from the past 240 hour(s)).       Radiology Studies: Chest 2 View  Result Date: 06/25/2016 CLINICAL DATA:  Preoperative evaluation for colonic mass. EXAM: CHEST  2 VIEW COMPARISON:  CT abdomen and pelvis June 24, 2016 FINDINGS: Cardiomediastinal silhouette is normal. Mildly tortuous aorta with chronic hypertension. Mildly calcified aortic knob. Bibasilar strandy densities without pleural effusion or focal consolidation. Mild bronchitic changes. No pneumothorax. Nasogastric tube side port projects in proximal stomach, distal tip not imaged. Scattered small bowel air-fluid levels. IMPRESSION: Mild bronchitic changes and bibasilar atelectasis. Nasogastric tube side port projecting in proximal stomach. Electronically Signed   By: Elon Alas M.D.   On: 06/25/2016 15:10   Ct Abdomen Pelvis W Contrast  Result Date: 06/24/2016 CLINICAL DATA:  Patient with abdominal pain and cramping for 3 weeks. EXAM: CT ABDOMEN AND PELVIS WITH CONTRAST TECHNIQUE: Multidetector CT imaging of the  abdomen and pelvis was performed using the standard protocol following bolus administration of intravenous contrast. CONTRAST:  122mL ISOVUE-300 IOPAMIDOL (ISOVUE-300) INJECTION 61% COMPARISON:  None. FINDINGS: Lower chest: Normal heart size. Scarring and or atelectasis within the lower lobes bilaterally. No pleural effusion. Small hiatal hernia. Hepatobiliary: Liver is normal in size and contour. No focal hepatic lesions identified. Gallbladder is unremarkable. Pancreas: Unremarkable Spleen: Unremarkable Adrenals/Urinary Tract: The adrenal glands are normal. Kidneys enhance symmetrically with contrast. No hydronephrosis. Urinary bladder is unremarkable. Stomach/Bowel: Sigmoid colonic diverticulosis. 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 There is a 5.1 x 3.9 cm annular constricting mass involving the mid aspect of the ascending colon (image 66; series 5). There is an adjacent small foci of gas and small amount fluid within the colonic mesentery (image 52; series 7). There is dilatation of the cecum and upstream small bowel which is fluid and gas filled measuring up to  4 cm. Vascular/Lymphatic: Normal caliber abdominal aorta. Peripheral calcified atherosclerotic plaque. No retroperitoneal lymphadenopathy. Other: Uterus and adnexal structures are unremarkable. Musculoskeletal: Lumbar spine degenerative changes. No aggressive or acute appearing osseous lesions. IMPRESSION: There is a large annular constricting mass involving the mid aspect of the ascending colon most compatible with colonic carcinoma. There is a small adjacent foci of gas and free fluid raising the possibility of micro perforation. There is marked dilatation of the cecum and small bowel with fluid and gas most compatible with proximal obstruction caused by the large ascending colonic mass. Additionally there is a 6 cm fatty mass involving the colon at the level the hepatic flexure, most  suggestive of a lipoma. Aortic vascular calcifications. Sigmoid colonic diverticulosis without evidence for acute diverticulitis. Electronically Signed   By: Lovey Newcomer M.D.   On: 06/24/2016 21:04        Scheduled Meds: . amLODipine  5 mg Oral Daily  . antiseptic oral rinse  7 mL Mouth Rinse q12n4p  . [START ON 06/26/2016] atorvastatin  20 mg Oral q1800  . chlorhexidine  15 mL Mouth Rinse BID  . enoxaparin (LOVENOX) injection  40 mg Subcutaneous Q24H  . escitalopram  10 mg Oral Daily   Continuous Infusions: . dextrose 5 % and 0.9% NaCl 100 mL/hr at 06/25/16 1055     LOS: 1 day    Time spent: 25 minutes. Greater than 50% of this time was spent in direct contact with the patient coordinating care.     Lelon Frohlich, MD [facility] Pager [phone]  If 7PM-7AM, please contact night-coverage [email or web address] Password [email or web address] 06/25/2016, 4:11 PM

## 2016-06-26 DIAGNOSIS — K5660 Unspecified intestinal obstruction: Secondary | ICD-10-CM

## 2016-06-26 DIAGNOSIS — K6389 Other specified diseases of intestine: Secondary | ICD-10-CM

## 2016-06-26 LAB — IRON AND TIBC
IRON: 7 ug/dL — AB (ref 28–170)
Saturation Ratios: 2 % — ABNORMAL LOW (ref 10.4–31.8)
TIBC: 314 ug/dL (ref 250–450)
UIBC: 307 ug/dL

## 2016-06-26 LAB — FERRITIN: Ferritin: 21 ng/mL (ref 11–307)

## 2016-06-26 MED ORDER — DEXTROSE 5 % IV SOLN
2.0000 g | INTRAVENOUS | Status: AC
  Administered 2016-06-27: 2 g via INTRAVENOUS
  Filled 2016-06-26: qty 2

## 2016-06-26 MED ORDER — CHLORHEXIDINE GLUCONATE CLOTH 2 % EX PADS
6.0000 | MEDICATED_PAD | Freq: Once | CUTANEOUS | Status: AC
  Administered 2016-06-27: 6 via TOPICAL

## 2016-06-26 MED ORDER — CHLORHEXIDINE GLUCONATE CLOTH 2 % EX PADS
6.0000 | MEDICATED_PAD | Freq: Once | CUTANEOUS | Status: AC
  Administered 2016-06-26: 6 via TOPICAL

## 2016-06-26 NOTE — Progress Notes (Signed)
[facility] Hospital Day(s): 2.   Post op day(s):  Marland Kitchen   Interval History: Patient seen and examined, no acute events or new complaints overnight. Patient reports her abdominal pain and nausea have both resolved, and her abdominal distention is greatly reduced. She otherwise denies any flatus, CP, SOB, or fever/chills.  Review of Systems:  Constitutional: denies fever, chills  HEENT: denies cough or congestion  Respiratory: denies any shortness of breath  Cardiovascular: denies chest pain or palpitations  Gastrointestinal: denies N/V, diarrhea or constipation  Musculoskeletal: denies pain, decreased motor or sensation  Neurological: denies HA or vision/hearing changes   Vital signs in last 24 hours: [min-max] current  Temp:  [98.5 F (36.9 C)-99.4 F (37.4 C)] 98.8 F (37.1 C) (08/24 0513) Pulse Rate:  [94-108] 108 (08/24 0513) Resp:  [16-20] 20 (08/24 0513) BP: (107-119)/(59-64) 119/62 (08/24 0513) SpO2:  [92 %-94 %] 92 % (08/24 0513)     Height: 5\' 3"  (160 cm) Weight: 71.6 kg (157 lb 14.4 oz) BMI (Calculated): 28   Intake/Output this shift:  No intake/output data recorded.   Intake/Output last 2 shifts:  @IOLAST2SHIFTS @   Physical Exam:  Constitutional: alert, cooperative and no distress  HENT: normocephalic without obvious abnormality  Eyes: PERRL, EOM's grossly intact and symmetric  Neuro: CN II - XII grossly intact and symmetric without deficit  Respiratory: breathing non-labored at rest  Cardiovascular: regular rate and sinus rhythm  Gastrointestinal: soft, non-tender, and much less distended  Musculoskeletal: UE and LE FROM, motor and sensation grossly intact, NT    Labs:  CBC:  Lab Results  Component Value Date   WBC 10.6 (H) 06/24/2016   RBC 4.59 06/24/2016   BMP:  Lab Results  Component Value Date   GLUCOSE 113 (H) 06/24/2016   CO2 27 06/24/2016   BUN 15 06/24/2016   CREATININE 0.75 06/24/2016   CALCIUM 8.3 (L) 06/24/2016      Imaging studies:   Chest X-ray (06/26/2016) Cardiomediastinal silhouette is normal. Mildly tortuous aorta with chronic hypertension. Mildly calcified aortic knob. Bibasilar strandy densities without pleural effusion or focal consolidation. Mild bronchitic changes. No pneumothorax. Nasogastric tube side port projects in proximal stomach, distal tip not imaged. Scattered small bowel air-fluid levels.  EKG (06/25/2016) No acute ischemic abnormalities  Assessment/Plan:  70 y.o. female with obstructing mid-ascending colon mass (likely malignancy) with contained erosion/microperforation into the colonic mesentery, complicated by pertinent comorbidities including tobacco abuse, HTN, HLD, and limited continuity of prior medical care.                        - NPO, IVF                       - nasogastric decompression                       - will plan for partial colectomy tomorrow, Friday, 8/25                       - risk stratification and optimization by medical team appreciated                       - medical management of comorbidities as per medical team                       - DVT prophylaxis (Lovenox in context  of likely malignancy)                       - smoking cessation advised and discussed            - ambulation encouraged  All of the above findings and recommendations were discussed with the patient and her medical team, and all of patient's questions were answered to her expressed satisfaction.  Thank you for the opportunity to participate in this patient's care.   -- Marilynne Drivers Rosana Hoes, MD, [place]: [facility] Office: [phone]

## 2016-06-26 NOTE — Progress Notes (Signed)
PROGRESS NOTE    Janet Hanson  [id number] DOB: 1946/06/01 DOA: 06/24/2016 PCP: No PCP Per Patient     Brief Narrative:  [place] old woman admitted on April 22 from home due to abdominal pain, nausea, vomiting. She was found to have an obstructing colonic mass of the ascending colon. Has been seen in consultation by GI and surgery with plans for surgical intervention on 8/25.   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 -Keep NG in place prior to surgery. -Low risk for surgery, only significant medical history appears to be hypertension which is currently well controlled on Norvasc. -Chest x-ray with only mild bronchitic changes, EKG without acute ischemic abnormalities. -Will need staging CTs, possibly PET scan (CT scanner at [facility] is currently down). We will request oncology consultation as will need follow-up pending pathology report. -Hold all oral meds for now.  Anxiety -Hold Lexapro, diazepam given nothing by mouth state.   Hypertension -Well-controlled, hold amlodipine given nothing by mouth state. -Continue IV lopressor.   DVT prophylaxis: Lovenox Code Status: Full Code Family Communication: Daughter at bedside updated on plan of care and all questions answered on 8/23 Disposition Plan: To be determined. For OR on Friday  Consultants:   Surgery  GI  Procedures:   None  Antimicrobials:   None    Subjective: A little more abdominal pain today, continues to have significant NG output.  Objective: Vitals:   06/25/16 1440 06/25/16 2002 06/25/16 2012 06/26/16 0513  BP: 107/64 (!) 113/59  119/62  Pulse: (!) 103 94  (!) 108  Resp: 16 18  20   Temp: 98.5 F (36.9 C) 99.4 F (37.4 C)  98.8 F (37.1 C)  TempSrc: Oral Oral  Oral  SpO2: 92% 94% 92% 92%  Weight:      Height:        Intake/Output Summary (Last 24 hours) at 06/26/16  1015 Last data filed at 06/26/16 0916  Gross per 24 hour  Intake          1908.33 ml  Output             1000 ml  Net           908.33 ml   Filed Weights   06/24/16 1533 06/25/16 0004  Weight: 70.3 kg (155 lb) 71.6 kg (157 lb 14.4 oz)    Examination:  General exam: Alert, awake, oriented x 3, NG in place. Respiratory system: Clear to auscultation. Respiratory effort normal. Cardiovascular system:RRR. No murmurs, rubs, gallops. Gastrointestinal system: Abdomen is distended,  and nontender. Decreased bowel sounds. Central nervous system: Alert and oriented. No focal neurological deficits. Extremities: No C/C/E, +pedal pulses Skin: No rashes, lesions or ulcers Psychiatry: Judgement and insight appear normal. Mood & affect appropriate.     Data Reviewed: I have personally reviewed following labs and imaging studies  CBC:  Recent Labs Lab 06/24/16 1543  WBC 10.6*  NEUTROABS 8.4*  HGB 12.0  HCT 37.9  MCV 82.6  PLT 123456*   Basic Metabolic Panel:  Recent Labs Lab 06/24/16 1543  NA 132*  K 3.8  CL 97*  CO2 27  GLUCOSE 113*  BUN 15  CREATININE 0.75  CALCIUM 8.3*   GFR: Estimated Creatinine Clearance: 63.9 mL/min (by C-G formula based on SCr of 0.8 mg/dL). Liver Function Tests:  Recent Labs Lab 06/24/16 1543  AST 14*  ALT  10*  ALKPHOS 46  BILITOT 0.9  PROT 7.0  ALBUMIN 3.7   No results for input(s): LIPASE, AMYLASE in the last 168 hours. No results for input(s): AMMONIA in the last 168 hours. Coagulation Profile: No results for input(s): INR, PROTIME in the last 168 hours. Cardiac Enzymes: No results for input(s): CKTOTAL, CKMB, CKMBINDEX, TROPONINI in the last 168 hours. BNP (last 3 results) No results for input(s): PROBNP in the last 8760 hours. HbA1C: No results for input(s): HGBA1C in the last 72 hours. CBG: No results for input(s): GLUCAP in the last 168 hours. Lipid Profile: No results for input(s): CHOL, HDL, LDLCALC, TRIG, CHOLHDL, LDLDIRECT  in the last 72 hours. Thyroid Function Tests: No results for input(s): TSH, T4TOTAL, FREET4, T3FREE, THYROIDAB in the last 72 hours. Anemia Panel: No results for input(s): VITAMINB12, FOLATE, FERRITIN, TIBC, IRON, RETICCTPCT in the last 72 hours. Urine analysis:    Component Value Date/Time   COLORURINE AMBER (A) 06/24/2016 2226   APPEARANCEUR CLEAR 06/24/2016 2226   LABSPEC 1.020 06/24/2016 2226   PHURINE 5.0 06/24/2016 2226   GLUCOSEU NEGATIVE 06/24/2016 2226   HGBUR NEGATIVE 06/24/2016 2226   BILIRUBINUR SMALL (A) 06/24/2016 2226   KETONESUR 15 (A) 06/24/2016 2226   PROTEINUR 30 (A) 06/24/2016 2226   NITRITE NEGATIVE 06/24/2016 2226   LEUKOCYTESUR NEGATIVE 06/24/2016 2226   Sepsis Labs: @LABRCNTIP (procalcitonin:4,lacticidven:4)  ) Recent Results (from the past 240 hour(s))  Surgical PCR screen     Status: None   Collection Time: 06/25/16  3:00 PM  Result Value Ref Range Status   MRSA, PCR NEGATIVE NEGATIVE Final   Staphylococcus aureus NEGATIVE NEGATIVE Final    Comment:        The Xpert SA Assay (FDA approved for NASAL specimens in patients over 34 years of age), is one component of a comprehensive surveillance program.  Test performance has been validated by [facility] for patients greater than or equal to 2 year old. It is not intended to diagnose infection nor to guide or monitor treatment.          Radiology Studies: Chest 2 View  Result Date: 06/25/2016 CLINICAL DATA:  Preoperative evaluation for colonic mass. EXAM: CHEST  2 VIEW COMPARISON:  CT abdomen and pelvis June 24, 2016 FINDINGS: Cardiomediastinal silhouette is normal. Mildly tortuous aorta with chronic hypertension. Mildly calcified aortic knob. Bibasilar strandy densities without pleural effusion or focal consolidation. Mild bronchitic changes. No pneumothorax. Nasogastric tube side port projects in proximal stomach, distal tip not imaged. Scattered small bowel air-fluid levels. IMPRESSION:  Mild bronchitic changes and bibasilar atelectasis. Nasogastric tube side port projecting in proximal stomach. Electronically Signed   By: Elon Alas M.D.   On: 06/25/2016 15:10   Ct Abdomen Pelvis W Contrast  Result Date: 06/24/2016 CLINICAL DATA:  Patient with abdominal pain and cramping for 3 weeks. EXAM: CT ABDOMEN AND PELVIS WITH CONTRAST TECHNIQUE: Multidetector CT imaging of the abdomen and pelvis was performed using the standard protocol following bolus administration of intravenous contrast. CONTRAST:  143mL ISOVUE-300 IOPAMIDOL (ISOVUE-300) INJECTION 61% COMPARISON:  None. FINDINGS: Lower chest: Normal heart size. Scarring and or atelectasis within the lower lobes bilaterally. No pleural effusion. Small hiatal hernia. Hepatobiliary: Liver is normal in size and contour. No focal hepatic lesions identified. Gallbladder is unremarkable. Pancreas: Unremarkable Spleen: Unremarkable Adrenals/Urinary Tract: The adrenal glands are normal. Kidneys enhance symmetrically with contrast. No hydronephrosis. Urinary bladder is unremarkable. Stomach/Bowel: Sigmoid colonic diverticulosis. There is a 6.4 x 2.7 cm lipoma involving the  colon near the hepatic flexure (image 45; series 5). There is a 5.1 x 3.9 cm annular constricting mass involving the mid aspect of the ascending colon (image 66; series 5). There is an adjacent small foci of gas and small amount fluid within the colonic mesentery (image 52; series 7). There is dilatation of the cecum and upstream small bowel which is fluid and gas filled measuring up to 4 cm. Vascular/Lymphatic: Normal caliber abdominal aorta. Peripheral calcified atherosclerotic plaque. No retroperitoneal lymphadenopathy. Other: Uterus and adnexal structures are unremarkable. Musculoskeletal: Lumbar spine degenerative changes. No aggressive or acute appearing osseous lesions. IMPRESSION: There is a large annular constricting mass involving the mid aspect of the ascending colon most  compatible with colonic carcinoma. There is a small adjacent foci of gas and free fluid raising the possibility of micro perforation. There is marked dilatation of the cecum and small bowel with fluid and gas most compatible with proximal obstruction caused by the large ascending colonic mass. Additionally there is a 6 cm fatty mass involving the colon at the level the hepatic flexure, most suggestive of a lipoma. Aortic vascular calcifications. Sigmoid colonic diverticulosis without evidence for acute diverticulitis. Electronically Signed   By: Lovey Newcomer M.D.   On: 06/24/2016 21:04        Scheduled Meds: . antiseptic oral rinse  7 mL Mouth Rinse q12n4p  . chlorhexidine  15 mL Mouth Rinse BID  . enoxaparin (LOVENOX) injection  40 mg Subcutaneous Q24H  . metoprolol  5 mg Intravenous Q12H   Continuous Infusions: . dextrose 5 % and 0.9% NaCl 100 mL/hr at 06/26/16 0852     LOS: 2 days    Time spent: 25 minutes. Greater than 50% of this time was spent in direct contact with the patient coordinating care.     Lelon Frohlich, MD [facility] Pager [phone]  If 7PM-7AM, please contact night-coverage [email or web address] Password [email or web address] 06/26/2016, 10:15 AM

## 2016-06-26 NOTE — Progress Notes (Signed)
  Subjective: Patient states she feels about the same as she did yesterday. She is passing very small amount of flatus. No BM. She feels her abdomen may be some more distended than this morning. He states pain is well controlled with medication. He tells me her daughter will bring prior CT films from [facility] later today.    Objective: Blood pressure 119/62, pulse (!) 108, temperature 98.8 F (37.1 C), temperature source Oral, resp. rate 20, height 5\' 3"  (1.6 m), weight 157 lb 14.4 oz (71.6 kg), SpO2 92 %. Patient is alert and in no acute distress. Conjunctiva is pink. Sclera is nonicteric NG tube is in place draining bilious fluid. Abdomen is full. Palpation it is soft without tenderness organomegaly or masses. No LE edema or clubbing noted.  NG output last 24 hours 1000 mL.  Labs/studies Results:   Recent Labs  06/24/16 1543  WBC 10.6*  HGB 12.0  HCT 37.9  PLT 443*    BMET   Recent Labs  06/24/16 1543  NA 132*  K 3.8  CL 97*  CO2 27  GLUCOSE 113*  BUN 15  CREATININE 0.75  CALCIUM 8.3*    LFT   Recent Labs  06/24/16 1543  PROT 7.0  ALBUMIN 3.7  AST 14*  ALT 10*  ALKPHOS 46  BILITOT 0.9    CEA is pending.   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 Partial small bowel decompression with NG suction. Patient for bowel surgery on 06/27/2016.

## 2016-06-26 NOTE — Progress Notes (Signed)
Pt sitting up in chair. C/O back pain, relieved by 1 mg Morphine. NG tube in place to right nare, on low intermittent suction. Will continue to monitor.

## 2016-06-27 ENCOUNTER — Encounter (HOSPITAL_COMMUNITY)
Admission: EM | Disposition: A | Discharge status: Home / Self Care | Payer: Self-pay | Source: Home / Self Care | Attending: Internal Medicine

## 2016-06-27 ENCOUNTER — Encounter (HOSPITAL_COMMUNITY): Payer: Self-pay | Admitting: Anesthesiology

## 2016-06-27 ENCOUNTER — Inpatient Hospital Stay (HOSPITAL_COMMUNITY): Payer: Medicare Other

## 2016-06-27 ENCOUNTER — Inpatient Hospital Stay (HOSPITAL_COMMUNITY): Payer: Medicare Other | Admitting: Anesthesiology

## 2016-06-27 ENCOUNTER — Ambulatory Visit: Payer: Self-pay | Admitting: Surgery

## 2016-06-27 HISTORY — PX: PARTIAL COLECTOMY: SHX5273

## 2016-06-27 LAB — SURGICAL PCR SCREEN
MRSA, PCR: INVALID — AB
Staphylococcus aureus: INVALID — AB

## 2016-06-27 LAB — CEA: CEA: 39.3 ng/mL — AB (ref 0.0–4.7)

## 2016-06-27 SURGERY — COLECTOMY, PARTIAL
Anesthesia: General | Site: Abdomen

## 2016-06-27 SURGERY — Surgical Case
Anesthesia: *Unknown

## 2016-06-27 MED ORDER — MEPERIDINE HCL 50 MG/ML IJ SOLN: 6.2500 mg | INTRAMUSCULAR | Status: DC | PRN

## 2016-06-27 MED ORDER — 0.9 % SODIUM CHLORIDE (POUR BTL) OPTIME
TOPICAL | Status: DC | PRN
  Administered 2016-06-27: 1000 mL

## 2016-06-27 MED ORDER — PROPOFOL 10 MG/ML IV BOLUS
INTRAVENOUS | Status: DC | PRN
  Administered 2016-06-27: 150 mg via INTRAVENOUS

## 2016-06-27 MED ORDER — ROCURONIUM BROMIDE 50 MG/5ML IV SOLN
INTRAVENOUS | Status: AC
  Filled 2016-06-27: qty 1

## 2016-06-27 MED ORDER — POVIDONE-IODINE 10 % EX OINT
TOPICAL_OINTMENT | CUTANEOUS | Status: AC
  Filled 2016-06-27: qty 1

## 2016-06-27 MED ORDER — GLYCOPYRROLATE 0.2 MG/ML IJ SOLN
INTRAMUSCULAR | Status: AC
  Filled 2016-06-27: qty 3

## 2016-06-27 MED ORDER — LACTATED RINGERS IV SOLN
INTRAVENOUS | Status: DC | PRN
  Administered 2016-06-27 (×3): via INTRAVENOUS

## 2016-06-27 MED ORDER — ONDANSETRON HCL 4 MG/2ML IJ SOLN
INTRAMUSCULAR | Status: DC | PRN
  Administered 2016-06-27: 4 mg via INTRAVENOUS

## 2016-06-27 MED ORDER — CEFOTETAN DISODIUM-DEXTROSE 2-2.08 GM-% IV SOLR
2.0000 g | Freq: Two times a day (BID) | INTRAVENOUS | Status: AC
  Administered 2016-06-27: 2 g via INTRAVENOUS
  Filled 2016-06-27: qty 50

## 2016-06-27 MED ORDER — MIDAZOLAM HCL 2 MG/2ML IJ SOLN
INTRAMUSCULAR | Status: AC
  Filled 2016-06-27: qty 2

## 2016-06-27 MED ORDER — ROCURONIUM 10MG/ML (10ML) SYRINGE FOR MEDFUSION PUMP - OPTIME
INTRAVENOUS | Status: DC | PRN
  Administered 2016-06-27: 35 mg via INTRAVENOUS
  Administered 2016-06-27: 5 mg via INTRAVENOUS

## 2016-06-27 MED ORDER — ENOXAPARIN SODIUM 40 MG/0.4ML ~~LOC~~ SOLN
40.0000 mg | SUBCUTANEOUS | Status: DC
  Administered 2016-06-28 – 2016-07-01 (×4): 40 mg via SUBCUTANEOUS
  Filled 2016-06-27 (×4): qty 0.4

## 2016-06-27 MED ORDER — PROPOFOL 10 MG/ML IV BOLUS
INTRAVENOUS | Status: AC
  Filled 2016-06-27: qty 20

## 2016-06-27 MED ORDER — FENTANYL CITRATE (PF) 100 MCG/2ML IJ SOLN
INTRAMUSCULAR | Status: DC | PRN
  Administered 2016-06-27 (×5): 50 ug via INTRAVENOUS

## 2016-06-27 MED ORDER — KCL IN DEXTROSE-NACL 20-5-0.45 MEQ/L-%-% IV SOLN
INTRAVENOUS | Status: DC
  Administered 2016-06-27 – 2016-07-01 (×8): via INTRAVENOUS

## 2016-06-27 MED ORDER — POVIDONE-IODINE 10 % OINT PACKET
TOPICAL_OINTMENT | CUTANEOUS | Status: DC | PRN
  Administered 2016-06-27: 1 via TOPICAL

## 2016-06-27 MED ORDER — GLYCOPYRROLATE 0.2 MG/ML IJ SOLN
INTRAMUSCULAR | Status: DC | PRN
  Administered 2016-06-27: 0.6 mg via INTRAVENOUS

## 2016-06-27 MED ORDER — PROMETHAZINE HCL 25 MG/ML IJ SOLN: 6.2500 mg | INTRAMUSCULAR | Status: DC | PRN

## 2016-06-27 MED ORDER — BUPIVACAINE LIPOSOME 1.3 % IJ SUSP
INTRAMUSCULAR | Status: AC
  Filled 2016-06-27: qty 20

## 2016-06-27 MED ORDER — HYDROMORPHONE HCL 1 MG/ML IJ SOLN
0.2500 mg | INTRAMUSCULAR | Status: DC | PRN
  Administered 2016-06-27 (×4): 0.5 mg via INTRAVENOUS
  Filled 2016-06-27 (×2): qty 1

## 2016-06-27 MED ORDER — SUCCINYLCHOLINE CHLORIDE 20 MG/ML IJ SOLN
INTRAMUSCULAR | Status: AC
  Filled 2016-06-27: qty 1

## 2016-06-27 MED ORDER — NEOSTIGMINE METHYLSULFATE 10 MG/10ML IV SOLN
INTRAVENOUS | Status: AC
  Filled 2016-06-27: qty 1

## 2016-06-27 MED ORDER — CEFOTETAN DISODIUM-DEXTROSE 2-2.08 GM-% IV SOLR
INTRAVENOUS | Status: AC
  Filled 2016-06-27: qty 50

## 2016-06-27 MED ORDER — PHENYLEPHRINE HCL 10 MG/ML IJ SOLN
INTRAMUSCULAR | Status: DC | PRN
  Administered 2016-06-27 (×4): 40 ug via INTRAVENOUS

## 2016-06-27 MED ORDER — SEVOFLURANE IN SOLN
RESPIRATORY_TRACT | Status: AC
Start: 2016-06-27 — End: 2016-06-27
  Filled 2016-06-27: qty 250

## 2016-06-27 MED ORDER — MORPHINE SULFATE (PF) 2 MG/ML IV SOLN
2.0000 mg | Freq: Once | INTRAVENOUS | Status: AC
  Administered 2016-06-27: 2 mg via INTRAVENOUS
  Filled 2016-06-27: qty 1

## 2016-06-27 MED ORDER — SUCCINYLCHOLINE 20MG/ML (10ML) SYRINGE FOR MEDFUSION PUMP - OPTIME
INTRAMUSCULAR | Status: DC | PRN
  Administered 2016-06-27: 120 mg via INTRAVENOUS

## 2016-06-27 MED ORDER — FENTANYL CITRATE (PF) 250 MCG/5ML IJ SOLN
INTRAMUSCULAR | Status: AC
  Filled 2016-06-27: qty 5

## 2016-06-27 MED ORDER — ORAL CARE MOUTH RINSE
15.0000 mL | Freq: Two times a day (BID) | OROMUCOSAL | Status: DC
  Administered 2016-06-28 – 2016-07-01 (×5): 15 mL via OROMUCOSAL

## 2016-06-27 MED ORDER — NEOSTIGMINE METHYLSULFATE 10 MG/10ML IV SOLN
INTRAVENOUS | Status: DC | PRN
  Administered 2016-06-27: 3 mg via INTRAVENOUS

## 2016-06-27 MED ORDER — BUPIVACAINE LIPOSOME 1.3 % IJ SUSP
INTRAMUSCULAR | Status: DC | PRN
  Administered 2016-06-27: 20 mL

## 2016-06-27 MED ORDER — MIDAZOLAM HCL 5 MG/5ML IJ SOLN
INTRAMUSCULAR | Status: DC | PRN
  Administered 2016-06-27: 2 mg via INTRAVENOUS

## 2016-06-27 SURGICAL SUPPLY — 67 items
APPLIER CLIP 11 MED OPEN (CLIP)
APPLIER CLIP 13 LRG OPEN (CLIP)
BAG HAMPER (MISCELLANEOUS) ×3 IMPLANT
BARRIER SKIN 2 3/4 (OSTOMY) IMPLANT
BARRIER SKIN 2 3/4 INCH (OSTOMY)
CELLS DAT CNTRL 66122 CELL SVR (MISCELLANEOUS) ×1 IMPLANT
CHLORAPREP W/TINT 26ML (MISCELLANEOUS) ×3 IMPLANT
CLAMP POUCH DRAINAGE QUIET (OSTOMY) IMPLANT
CLIP APPLIE 11 MED OPEN (CLIP) IMPLANT
CLIP APPLIE 13 LRG OPEN (CLIP) IMPLANT
CLOTH BEACON ORANGE TIMEOUT ST (SAFETY) ×3 IMPLANT
COVER LIGHT HANDLE STERIS (MISCELLANEOUS) ×6 IMPLANT
COVER MAYO STAND XLG (DRAPE) ×3 IMPLANT
DRAPE UTILITY W/TAPE 26X15 (DRAPES) ×6 IMPLANT
DRAPE WARM FLUID 44X44 (DRAPE) ×3 IMPLANT
DRSG OPSITE POSTOP 4X10 (GAUZE/BANDAGES/DRESSINGS) IMPLANT
DRSG OPSITE POSTOP 4X8 (GAUZE/BANDAGES/DRESSINGS) ×3 IMPLANT
ELECT BLADE 6 FLAT ULTRCLN (ELECTRODE) IMPLANT
ELECT REM PT RETURN 9FT ADLT (ELECTROSURGICAL) ×3
ELECTRODE REM PT RTRN 9FT ADLT (ELECTROSURGICAL) ×1 IMPLANT
GLOVE BIOGEL PI IND STRL 7.0 (GLOVE) ×2 IMPLANT
GLOVE BIOGEL PI IND STRL 7.5 (GLOVE) ×2 IMPLANT
GLOVE BIOGEL PI INDICATOR 7.0 (GLOVE) ×4
GLOVE BIOGEL PI INDICATOR 7.5 (GLOVE) ×4
GLOVE ECLIPSE 7.0 STRL STRAW (GLOVE) ×6 IMPLANT
GLOVE SURG SS PI 7.5 STRL IVOR (GLOVE) ×9 IMPLANT
GOWN STRL REUS W/TWL LRG LVL3 (GOWN DISPOSABLE) ×18 IMPLANT
HANDLE SUCTION POOLE (INSTRUMENTS) ×1 IMPLANT
INST SET MAJOR GENERAL (KITS) ×3 IMPLANT
KIT BLADEGUARD II DBL (SET/KITS/TRAYS/PACK) ×3 IMPLANT
KIT ROOM TURNOVER APOR (KITS) ×3 IMPLANT
LIGASURE IMPACT 36 18CM CVD LR (INSTRUMENTS) ×3 IMPLANT
MANIFOLD NEPTUNE II (INSTRUMENTS) ×3 IMPLANT
NEEDLE HYPO 18GX1.5 BLUNT FILL (NEEDLE) ×3 IMPLANT
NEEDLE HYPO 21X1.5 SAFETY (NEEDLE) ×3 IMPLANT
NS IRRIG 1000ML POUR BTL (IV SOLUTION) ×6 IMPLANT
PACK ABDOMINAL MAJOR (CUSTOM PROCEDURE TRAY) ×3 IMPLANT
PAD ARMBOARD 7.5X6 YLW CONV (MISCELLANEOUS) ×3 IMPLANT
PENCIL HANDSWITCHING (ELECTRODE) ×3 IMPLANT
POUCH OSTOMY 2 3/4  H 3804 (WOUND CARE)
POUCH OSTOMY 2 PC DRNBL 2.25 (WOUND CARE) IMPLANT
POUCH OSTOMY 2 PC DRNBL 2.75 (WOUND CARE) IMPLANT
POUCH OSTOMY DRNBL 2 1/4 (WOUND CARE)
RELOAD PROXIMATE 75MM BLUE (ENDOMECHANICALS) ×6 IMPLANT
RETRACTOR WND ALEXIS 25 LRG (MISCELLANEOUS) ×1 IMPLANT
RTRCTR WOUND ALEXIS 18CM MED (MISCELLANEOUS) ×3
RTRCTR WOUND ALEXIS 25CM LRG (MISCELLANEOUS) ×3
SET BASIN LINEN APH (SET/KITS/TRAYS/PACK) ×3 IMPLANT
SPONGE LAP 18X18 X RAY DECT (DISPOSABLE) ×3 IMPLANT
STAPLER GUN LINEAR PROX 60 (STAPLE) ×3 IMPLANT
STAPLER PROXIMATE 75MM BLUE (STAPLE) ×3 IMPLANT
STAPLER VISISTAT (STAPLE) ×3 IMPLANT
SUCTION POOLE HANDLE (INSTRUMENTS) ×3
SUCTION YANKAUER HANDLE (MISCELLANEOUS) ×3 IMPLANT
SUT CHROMIC 0 SH (SUTURE) IMPLANT
SUT CHROMIC 2 0 SH (SUTURE) IMPLANT
SUT CHROMIC 3 0 SH 27 (SUTURE) IMPLANT
SUT NOVA NAB GS-26 0 60 (SUTURE) IMPLANT
SUT PDS AB 0 CTX 60 (SUTURE) ×6 IMPLANT
SUT SILK 2 0 (SUTURE)
SUT SILK 2 0 REEL (SUTURE) ×3 IMPLANT
SUT SILK 2-0 18XBRD TIE 12 (SUTURE) IMPLANT
SUT SILK 3 0 SH CR/8 (SUTURE) ×3 IMPLANT
SYR 20CC LL (SYRINGE) ×3 IMPLANT
TOWEL BLUE STERILE X RAY DET (MISCELLANEOUS) IMPLANT
TOWEL OR 17X26 4PK STRL BLUE (TOWEL DISPOSABLE) ×3 IMPLANT
TRAY FOLEY W/METER SILVER 16FR (SET/KITS/TRAYS/PACK) ×3 IMPLANT

## 2016-06-27 NOTE — Transfer of Care (Signed)
Immediate Anesthesia Transfer of Care Note  Patient: Janet Hanson  Procedure(s) Performed: Procedure(s): PARTIAL COLECTOMY (N/A)  Patient Location: PACU  Anesthesia Type:General  Level of Consciousness: awake  Airway & Oxygen Therapy: Patient Spontanous Breathing and Patient connected to face mask oxygen  Post-op Assessment: Report given to RN  Post vital signs: Reviewed  Last Vitals:  Vitals:   06/27/16 1101 06/27/16 1445  BP: 126/84 116/71  Pulse:  81  Resp: 18 (!) 23  Temp: 36.7 C (P) 36.6 C    Last Pain:  Vitals:   06/27/16 1101  TempSrc: Oral  PainSc:       Patients Stated Pain Goal: 3 ([id number])  Complications: No apparent anesthesia complications

## 2016-06-27 NOTE — Anesthesia Procedure Notes (Signed)
Procedure Name: Intubation Performed by: Ollen Bowl Pre-anesthesia Checklist: Patient identified, Patient being monitored, Timeout performed, Emergency Drugs available and Suction available Patient Re-evaluated:Patient Re-evaluated prior to inductionOxygen Delivery Method: Circle System Utilized Preoxygenation: Pre-oxygenation with 100% oxygen Intubation Type: IV induction, Rapid sequence and Cricoid Pressure applied Ventilation: Mask ventilation without difficulty Laryngoscope Size: Mac and 3 Grade View: Grade II Tube type: Oral Tube size: 7.0 mm Number of attempts: 1 Airway Equipment and Method: stylet Placement Confirmation: ETT inserted through vocal cords under direct vision,  positive ETCO2 and breath sounds checked- equal and bilateral Secured at: 21 cm Tube secured with: Tape Dental Injury: Teeth and Oropharynx as per pre-operative assessment

## 2016-06-27 NOTE — Anesthesia Postprocedure Evaluation (Signed)
Anesthesia Post Note  Patient: Janet Hanson  Procedure(s) Performed: Procedure(s) (LRB): PARTIAL COLECTOMY (N/A)  Patient location during evaluation: PACU Anesthesia Type: General Level of consciousness: awake and alert and oriented Pain management: pain level controlled Vital Signs Assessment: post-procedure vital signs reviewed and stable Respiratory status: spontaneous breathing, nonlabored ventilation, respiratory function stable and patient connected to nasal cannula oxygen Cardiovascular status: blood pressure returned to baseline and stable Postop Assessment: no signs of nausea or vomiting Anesthetic complications: no    Last Vitals:  Vitals:   06/27/16 1101 06/27/16 1445  BP: 126/84 116/71  Pulse:  81  Resp: 18 (!) 23  Temp: 36.7 C 36.6 C    Last Pain:  Vitals:   06/27/16 1445  TempSrc:   PainSc: 6                  Linette Gunderson A.

## 2016-06-27 NOTE — Progress Notes (Signed)
PROGRESS NOTE    Janet Hanson  [id number] DOB: 1946-11-02 DOA: 06/24/2016 PCP: No PCP Per Patient   SEEN EARLIER TODAY PRIOR TO BEING TAKEN DOWN TO THE OR  Brief Narrative:  Baring old woman admitted on April 22 from home due to abdominal pain, nausea, vomiting. She was found to have an obstructing colonic mass of the ascending colon. Has been seen in consultation by GI and surgery with plans for surgical intervention on 8/25.   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 -Keep NG in place prior to surgery. -Low risk for surgery, only significant medical history appears to be hypertension which is currently well controlled on Norvasc. -Chest x-ray with only mild bronchitic changes, EKG without acute ischemic abnormalities. -Will need staging CTs, possibly PET scan (CT scanner at [facility] is currently down). We will request oncology consultation as will need follow-up pending pathology report. -Hold all oral meds for now.  Anxiety -Hold Lexapro, diazepam given nothing by mouth state.   Hypertension -Well-controlled, hold amlodipine given nothing by mouth state. -Continue IV lopressor.   DVT prophylaxis: Lovenox Code Status: Full Code Family Communication: Daughter at bedside updated on plan of care and all questions answered on 8/23 Disposition Plan: To be determined. For OR on Friday  Consultants:   Surgery  GI  Procedures:   None  Antimicrobials:   None    Subjective: A little more abdominal pain today, continues to have significant NG output.  Objective: Vitals:   06/26/16 0513 06/27/16 0004 06/27/16 0431 06/27/16 1101  BP: 119/62 132/72 110/60 126/84  Pulse: (!) 108 97 87   Resp: 20 20 20 18   Temp: 98.8 F (37.1 C) 99.3 F (37.4 C) 98.4 F (36.9 C) 98 F (36.7 C)  TempSrc: Oral Oral Oral Oral  SpO2: 92% 95% 98% 95%  Weight:     71.2 kg (157 lb)  Height:    5\' 3"  (1.6 m)    Intake/Output Summary (Last 24 hours) at 06/27/16 1152 Last data filed at 06/27/16 0900  Gross per 24 hour  Intake          1216.67 ml  Output              950 ml  Net           266.67 ml   Filed Weights   06/24/16 1533 06/25/16 0004 06/27/16 1101  Weight: 70.3 kg (155 lb) 71.6 kg (157 lb 14.4 oz) 71.2 kg (157 lb)    Examination:  General exam: Alert, awake, oriented x 3, NG in place. Respiratory system: Clear to auscultation. Respiratory effort normal. Cardiovascular system:RRR. No murmurs, rubs, gallops. Gastrointestinal system: Abdomen is distended,  and nontender. Decreased bowel sounds. Central nervous system: Alert and oriented. No focal neurological deficits. Extremities: No C/C/E, +pedal pulses Skin: No rashes, lesions or ulcers Psychiatry: Judgement and insight appear normal. Mood & affect appropriate.     Data Reviewed: I have personally reviewed following labs and imaging studies  CBC:  Recent Labs Lab 06/24/16 1543  WBC 10.6*  NEUTROABS 8.4*  HGB 12.0  HCT 37.9  MCV 82.6  PLT 123456*   Basic Metabolic Panel:  Recent Labs Lab 06/24/16 1543  NA 132*  K 3.8  CL 97*  CO2 27  GLUCOSE 113*  BUN 15  CREATININE 0.75  CALCIUM 8.3*   GFR: Estimated Creatinine Clearance: 63.6  mL/min (by C-G formula based on SCr of 0.8 mg/dL). Liver Function Tests:  Recent Labs Lab 06/24/16 1543  AST 14*  ALT 10*  ALKPHOS 46  BILITOT 0.9  PROT 7.0  ALBUMIN 3.7   No results for input(s): LIPASE, AMYLASE in the last 168 hours. No results for input(s): AMMONIA in the last 168 hours. Coagulation Profile: No results for input(s): INR, PROTIME in the last 168 hours. Cardiac Enzymes: No results for input(s): CKTOTAL, CKMB, CKMBINDEX, TROPONINI in the last 168 hours. BNP (last 3 results) No results for input(s): PROBNP in the last 8760 hours. HbA1C: No results for input(s): HGBA1C in the last 72 hours. CBG: No results  for input(s): GLUCAP in the last 168 hours. Lipid Profile: No results for input(s): CHOL, HDL, LDLCALC, TRIG, CHOLHDL, LDLDIRECT in the last 72 hours. Thyroid Function Tests: No results for input(s): TSH, T4TOTAL, FREET4, T3FREE, THYROIDAB in the last 72 hours. Anemia Panel:  Recent Labs  06/26/16 1014  FERRITIN 21  TIBC 314  IRON 7*   Urine analysis:    Component Value Date/Time   COLORURINE AMBER (A) 06/24/2016 2226   APPEARANCEUR CLEAR 06/24/2016 2226   LABSPEC 1.020 06/24/2016 2226   PHURINE 5.0 06/24/2016 2226   GLUCOSEU NEGATIVE 06/24/2016 2226   HGBUR NEGATIVE 06/24/2016 2226   BILIRUBINUR SMALL (A) 06/24/2016 2226   KETONESUR 15 (A) 06/24/2016 2226   PROTEINUR 30 (A) 06/24/2016 2226   NITRITE NEGATIVE 06/24/2016 2226   LEUKOCYTESUR NEGATIVE 06/24/2016 2226   Sepsis Labs: @LABRCNTIP (procalcitonin:4,lacticidven:4)  ) Recent Results (from the past 240 hour(s))  Surgical PCR screen     Status: None   Collection Time: 06/25/16  3:00 PM  Result Value Ref Range Status   MRSA, PCR NEGATIVE NEGATIVE Final   Staphylococcus aureus NEGATIVE NEGATIVE Final    Comment:        The Xpert SA Assay (FDA approved for NASAL specimens in patients over 2 years of age), is one component of a comprehensive surveillance program.  Test performance has been validated by [facility] for patients greater than or equal to 34 year old. It is not intended to diagnose infection nor to guide or monitor treatment.          Radiology Studies: Chest 2 View  Result Date: 06/25/2016 CLINICAL DATA:  Preoperative evaluation for colonic mass. EXAM: CHEST  2 VIEW COMPARISON:  CT abdomen and pelvis June 24, 2016 FINDINGS: Cardiomediastinal silhouette is normal. Mildly tortuous aorta with chronic hypertension. Mildly calcified aortic knob. Bibasilar strandy densities without pleural effusion or focal consolidation. Mild bronchitic changes. No pneumothorax. Nasogastric tube side port  projects in proximal stomach, distal tip not imaged. Scattered small bowel air-fluid levels. IMPRESSION: Mild bronchitic changes and bibasilar atelectasis. Nasogastric tube side port projecting in proximal stomach. Electronically Signed   By: Elon Alas M.D.   On: 06/25/2016 15:10        Scheduled Meds: . [facility] Hold] antiseptic oral rinse  7 mL Mouth Rinse q12n4p  . cefoTEtan (CEFOTAN) IV  2 g Intravenous On Call to OR  . [MAR Hold] chlorhexidine  15 mL Mouth Rinse BID  . enoxaparin (LOVENOX) injection  40 mg Subcutaneous Q24H  . [MAR Hold] metoprolol  5 mg Intravenous Q12H   Continuous Infusions: . dextrose 5 % and 0.9% NaCl 100 mL/hr at 06/26/16 1810     LOS: 3 days    Time spent: 25 minutes. Greater than 50% of this time was spent in direct contact with the patient  coordinating care.     Lelon Frohlich, MD [facility] Pager [phone]  If 7PM-7AM, please contact night-coverage [email or web address] Password [facility] 06/27/2016, 11:52 AM

## 2016-06-27 NOTE — Care Management Important Message (Signed)
Important Message  Patient Details  Name: Janet Hanson MRN: [id number] Date of Birth: 1946-06-24   Medicare Important Message Given:  Yes    Jimesha Rising, Chauncey Reading, RN 06/27/2016, 9:01 AM

## 2016-06-27 NOTE — Addendum Note (Signed)
Addendum  created 06/27/16 1517 by Josephine Igo, MD   Sign clinical note

## 2016-06-27 NOTE — Progress Notes (Signed)
Operative findings noted. CEA 39.3. Patient will undergo colonoscopy within the next 3-6 months or earlier if her stage such that she would need adjuvant chemotherapy.

## 2016-06-27 NOTE — Anesthesia Postprocedure Evaluation (Signed)
Anesthesia Post Note  Patient: Janet Hanson  Procedure(s) Performed: Procedure(s) (LRB): PARTIAL COLECTOMY (N/A)  Patient location during evaluation: PACU Anesthesia Type: General Level of consciousness: awake and alert Pain management: pain level controlled Vital Signs Assessment: post-procedure vital signs reviewed and stable Respiratory status: spontaneous breathing and patient connected to face mask oxygen Cardiovascular status: blood pressure returned to baseline Postop Assessment: no signs of nausea or vomiting Anesthetic complications: no    Last Vitals:  Vitals:   06/27/16 1101 06/27/16 1445  BP: 126/84 116/71  Pulse:  81  Resp: 18 (!) 23  Temp: 36.7 C 36.6 C    Last Pain:  Vitals:   06/27/16 1445  TempSrc:   PainSc: 6                  Chenel Wernli

## 2016-06-27 NOTE — Op Note (Signed)
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   PROCEDURE(S): (cpt's: [id number]) 1.) Partial colectomy with primary linear stapled anastomosis  INTRAOPERATIVE FINDINGS: Large bulky mid-ascending colonic mass extending into the colonic mesentery and retroperitoneum with a layer of not visibly/palpably involved fat between the colon and underlying visualized and exposed duodenum and Right kidney  INTRAVENOUS FLUIDS: 2100 mL crystalloid   ESTIMATED BLOOD LOSS: 100 mL  URINE OUTPUT: 100 mL  SPECIMENS: Right/ascending colon and terminal ileum, including appendix  IMPLANTS: None  DRAINS: None   COMPLICATIONS: None apparent   CONDITION AT END OF PROCEDURE: Hemodynamically stable and extubated   DISPOSITION OF PATIENT: PACU  INDICATIONS FOR PROCEDURE:  70 y.o. female presented with worsening of abdominal pain, distention, and nausea and vomiting x 2-3 days. 3 weeks ago, she reportedly presented to [facility] with diarrhea and vomiting, for which a CT of her abdomen was performed, after which she was discharged home with Cipro and Flagyl for gastroenteritis. Since then, she continued to have diarrhea x 2 days and then stopped passing diarrhea, flatus, or bowel movements entirely and began having worsening abdominal pain and distention. 2 days ago, she again began vomiting and presented to [facility] ED. CT was performed, NG tube was inserted for decompression, and patient reports resolution of abdominal pain and nausea with less distention. Patient denies ever having had a colonoscopy. All risks, benefits, and alternatives to above procedure were discussed with the patient, all of patient's questions  were answered to her expressed satisfaction, and informed consent was obtained and documented.  DETAILS OF PROCEDURE: Patient was brought to the operating suite and appropriately identified. General anesthesia was administered along with appropriate pre-operative antibiotics, and endotracheal intubation was performed by anesthetist. In supine position, operative site was prepped and draped in the usual sterile fashion, and following a brief time out, an ~6 cm vertical midline incision was made from the Right side of the umbilicus and extended superiorly using a #10 blade scalpel and extended deep through subcutaneous tissues until fascia was visualized and exposed along the linea alba midline between the rectus abdominal muscles. Lower midline fascia was then divided in the midline. Upon dissecting through preperitoneal fat, the peritoneum was entered and opened along the length of the incision, taking care to avoid injury to underlying dilated bowel.  A large, firm, and irregular mass was palpated in the ascending colon tethered to the underlying retroperitoneum, abdominal cavity was explored, and the liver was palpated with neither visible nor palpable evidence of hepatic, omental, or peritoneal metastases. The ascending colon was then retracted medially using a moist towel and, using a combination of sharp dissection and electrocautery, the colon was freed from its peritoneal attachments along the white line of Toldt proximally from the ileocecal junction and distally to the proximal transverse colon, taking down the hepatic flexure in the process. During this dissection, the ureters were protected. Points of transection were selected proximally and distally, and first the terminal ileum and then the proximal transverse colon were stapled and divided using a GIA-75 linear cutting stapler. Peritoneum was then scored along the mesentary with electrocautery, and the vessels were cauterized, sealed, and cut  using the Ligasure. Large named mesenteric arteries were ligated with 0-0 silk suture ties. Colon specimen including terminal ileum and  appendix was then handed off of the field as specimen for pathology. Two stapled ends of bowel were confirmed to lay adjacent and parallel to one another without tension, and the corners were each cut and dilated, through which linear cutting stapler was advanced and used to create the colonic anastomosis and to close the resulting colonic defect.  The staple line was inspected and found to be intact, widely patient, and hemostatic. Interrupted 3-0 silk Lembert sutures were then used to approximate tissue over the new anastomosis and to close the resulting mesenteric defect. The abdominal cavity was then copiously irrigated, and hemostasis was once more confirmed. The abdominal wall was then re-approximated in layers with #1 looped PDS sutures from the top and bottom of the incision. Exparel (72-hour release liposomal formulation of bupivicane) was injected into the fascia and subcutaneously, and surgical skin staples were then used to re-approximate skin. Skin was then cleaned and dried, and a sterile dressing was applied. Patient was then safely able to be extubated, awakened, and transferred to PACU for post-operative monitoring and care.  I was present for all aspects of the above procedure, and there were no complications apparent.

## 2016-06-28 LAB — BASIC METABOLIC PANEL
ANION GAP: 6 (ref 5–15)
BUN: 12 mg/dL (ref 6–20)
CALCIUM: 6.7 mg/dL — AB (ref 8.9–10.3)
CO2: 27 mmol/L (ref 22–32)
Chloride: 102 mmol/L (ref 101–111)
Creatinine, Ser: 0.55 mg/dL (ref 0.44–1.00)
GFR calc non Af Amer: >60 mL/min (ref >60)
Glucose, Bld: 129 mg/dL — ABNORMAL HIGH (ref 65–99)
Potassium: 3.4 mmol/L — ABNORMAL LOW (ref 3.5–5.1)
Sodium: 135 mmol/L (ref 135–145)

## 2016-06-28 LAB — CBC
HEMATOCRIT: 35.6 % — AB (ref 36.0–46.0)
HEMOGLOBIN: 11.1 g/dL — AB (ref 12.0–15.0)
MCH: 26.4 pg (ref 26.0–34.0)
MCHC: 31.2 g/dL (ref 30.0–36.0)
MCV: 84.6 fL (ref 78.0–100.0)
Platelets: 388 10*3/uL (ref 150–400)
RBC: 4.21 MIL/uL (ref 3.87–5.11)
RDW: 15.7 % — AB (ref 11.5–15.5)
WBC: 8.2 10*3/uL (ref 4.0–10.5)

## 2016-06-28 MED ORDER — MORPHINE SULFATE (PF) 2 MG/ML IV SOLN
2.0000 mg | INTRAVENOUS | Status: DC | PRN
  Administered 2016-06-28 – 2016-07-03 (×20): 2 mg via INTRAVENOUS
  Filled 2016-06-28 (×23): qty 1

## 2016-06-28 NOTE — Progress Notes (Signed)
[facility] Day(s): 4.   Post op day(s): 1 Day Post-Op.   Interval History: Patient seen and examined, no acute events or new complaints overnight. Patient reports she had some pain this morning when she awoke, but says it's since been well controlled, allowing her to sit at the side of the bed chatting with her daughter, to breath and reposition herself comfortably, and to even press on her abdomen without significant discomfort. She denies any flatus or BM, fever/chills, CP, or SOB. She has not yet ambulated since surgery.  Review of Systems:  Constitutional: denies fever, chills  HEENT: denies cough or congestion  Respiratory: denies any shortness of breath  Cardiovascular: denies chest pain or palpitations  Gastrointestinal: denies N/V, diarrhea or constipation  Musculoskeletal: denies pain, decreased motor or sensation  Neurological: denies HA or vision/hearing changes   Vital signs in last 24 hours: [min-max] current  Temp:  [97.9 F (36.6 C)-98.2 F (36.8 C)] 98.2 F (36.8 C) (08/26 1000) Pulse Rate:  [74-97] 97 (08/26 1040) Resp:  [17-23] 18 (08/26 1000) BP: (116-136)/(66-81) 121/66 (08/26 1040) SpO2:  [89 %-100 %] 98 % (08/26 1000)     Height: 5\' 3"  (160 cm) Weight: 71.2 kg (157 lb) BMI (Calculated): 27.9   Intake/Output this shift:  No intake/output data recorded.   Intake/Output last 2 shifts:  @IOLAST2SHIFTS @   Physical Exam:  Constitutional: alert, cooperative and no distress  HENT: normocephalic without obvious abnormality  Eyes: PERRL, EOM's grossly intact and symmetric  Neuro: CN II - XII grossly intact and symmetric without deficit  Respiratory: breathing non-labored at rest  Cardiovascular: regular rate and sinus rhythm  Gastrointestinal: soft, non-tender, and non-distended, incision well-approximated without erythema or drainage and dressing c/d/i Musculoskeletal: UE and LE FROM, motor and sensation grossly intact, NT    Labs:   CBC:  Lab Results  Component Value Date   WBC 8.2 06/28/2016   RBC 4.21 06/28/2016   BMP:  Lab Results  Component Value Date   GLUCOSE 129 (H) 06/28/2016   CO2 27 06/28/2016   BUN 12 06/28/2016   CREATININE 0.55 06/28/2016   CALCIUM 6.7 (L) 06/28/2016     Imaging studies: No new pertinent imaging studies    Assessment/Plan: (ICD-10's: K56.69, K56.7) 70 y.o. female with post-operative ileus, otherwise doing well 1 Day Post-Op s/p Right hemicolectomy for obstructing Right mid-ascending colonic neoplasm, complicated by pertinent comorbidities including tobacco abuse, HTN, HLD, and limited continuity of prior medical care.   - pain control prn   - remove Foley catheter  - minimize narcotics as tolerated  - discussed NGT with patient and her daughter. patient says she'd rather leave it in than need to replace it.  - if not a lot of additional drainage from NGT between this morning and tomorrow, will plan to remove NGT tomorrow  - DVT prophylaxis (Lovenox in preferred in context of likely malignancy)  - ambulation and smoking cessation encouraged   All of the above findings and recommendations were discussed with the patient and the medical team, and all of patient's questions were answered to her expressed satisfaction.  -- Marilynne Drivers Rosana Hoes, MD, [place]: [facility] Office: [phone]

## 2016-06-28 NOTE — Progress Notes (Addendum)
PROGRESS NOTE    Janet Hanson  [id number] DOB: [birth year] DOA: 06/24/2016 PCP: No PCP Per Patient     Brief Narrative:  [place] old woman admitted on April 22 from home due to abdominal pain, nausea, vomiting. She was found to have an obstructing colonic mass of the ascending colon. Has been seen in consultation by GI and surgery. Had hemicolectomy on 8/25. Plans to hopefully DC NG tube in 24 hours.  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 -s/p right hemicolectomy on 8/25. -Plan to hopefully remove NG tube in am as long as output decreases. -Surgery on board. -Awaiting oncology consultation on Monday as will need follow-up pending pathology report. -Hold all oral meds for now.  Anxiety -Hold Lexapro, diazepam given nothing by mouth state.   Hypertension -Well-controlled, hold amlodipine given nothing by mouth state. -Continue IV lopressor.   DVT prophylaxis: Lovenox Code Status: Full Code Family Communication: Daughter at bedside updated on plan of care and all questions answered on 8/23 Disposition Plan: To be determined.   Consultants:   Surgery  GI  Procedures:   None  Antimicrobials:   None    Subjective: Abdominal pain a little worse after surgery today.  Objective: Vitals:   06/27/16 2203 06/28/16 0614 06/28/16 1000 06/28/16 1040  BP: 136/81 128/77 122/66 121/66  Pulse:  80 93 97  Resp: 18 18 18    Temp: 98 F (36.7 C) 98.2 F (36.8 C) 98.2 F (36.8 C)   TempSrc: Oral Oral    SpO2: 97% 93% 98%   Weight:      Height:        Intake/Output Summary (Last 24 hours) at 06/28/16 1550 Last data filed at 06/28/16 1524  Gross per 24 hour  Intake             1680 ml  Output              350 ml  Net             1330 ml   Filed Weights   06/24/16 1533 06/25/16 0004 06/27/16 1101  Weight: 70.3 kg (155 lb) 71.6 kg (157 lb 14.4 oz) 71.2 kg  (157 lb)    Examination:  General exam: Alert, awake, oriented x 3, NG in place. Respiratory system: Clear to auscultation. Respiratory effort normal. Cardiovascular system:RRR. No murmurs, rubs, gallops. Gastrointestinal system: Abdomen is distended,  and nontender. Decreased bowel sounds. Central nervous system: Alert and oriented. No focal neurological deficits. Extremities: No C/C/E, +pedal pulses Skin: No rashes, lesions or ulcers Psychiatry: Judgement and insight appear normal. Mood & affect appropriate.     Data Reviewed: I have personally reviewed following labs and imaging studies  CBC:  Recent Labs Lab 06/24/16 1543 06/28/16 0634  WBC 10.6* 8.2  NEUTROABS 8.4*  --   HGB 12.0 11.1*  HCT 37.9 35.6*  MCV 82.6 84.6  PLT 443* [id number]   Basic Metabolic Panel:  Recent Labs Lab 06/24/16 1543 06/28/16 0634  NA 132* 135  K 3.8 3.4*  CL 97* 102  CO2 27 27  GLUCOSE 113* 129*  BUN 15 12  CREATININE 0.75 0.55  CALCIUM 8.3* 6.7*   GFR: Estimated Creatinine Clearance: 63.6 mL/min (by C-G formula based on SCr of 0.8 mg/dL). Liver Function Tests:  Recent Labs Lab 06/24/16 1543  AST 14*  ALT 10*  ALKPHOS 46  BILITOT  0.9  PROT 7.0  ALBUMIN 3.7   No results for input(s): LIPASE, AMYLASE in the last 168 hours. No results for input(s): AMMONIA in the last 168 hours. Coagulation Profile: No results for input(s): INR, PROTIME in the last 168 hours. Cardiac Enzymes: No results for input(s): CKTOTAL, CKMB, CKMBINDEX, TROPONINI in the last 168 hours. BNP (last 3 results) No results for input(s): PROBNP in the last 8760 hours. HbA1C: No results for input(s): HGBA1C in the last 72 hours. CBG: No results for input(s): GLUCAP in the last 168 hours. Lipid Profile: No results for input(s): CHOL, HDL, LDLCALC, TRIG, CHOLHDL, LDLDIRECT in the last 72 hours. Thyroid Function Tests: No results for input(s): TSH, T4TOTAL, FREET4, T3FREE, THYROIDAB in the last 72  hours. Anemia Panel:  Recent Labs  06/26/16 1014  FERRITIN 21  TIBC 314  IRON 7*   Urine analysis:    Component Value Date/Time   COLORURINE AMBER (A) 06/24/2016 2226   APPEARANCEUR CLEAR 06/24/2016 2226   LABSPEC 1.020 06/24/2016 2226   PHURINE 5.0 06/24/2016 2226   GLUCOSEU NEGATIVE 06/24/2016 2226   HGBUR NEGATIVE 06/24/2016 2226   BILIRUBINUR SMALL (A) 06/24/2016 2226   KETONESUR 15 (A) 06/24/2016 2226   PROTEINUR 30 (A) 06/24/2016 2226   NITRITE NEGATIVE 06/24/2016 2226   LEUKOCYTESUR NEGATIVE 06/24/2016 2226   Sepsis Labs: @LABRCNTIP (procalcitonin:4,lacticidven:4)  ) Recent Results (from the past 240 hour(s))  Surgical PCR screen     Status: None   Collection Time: 06/25/16  3:00 PM  Result Value Ref Range Status   MRSA, PCR NEGATIVE NEGATIVE Final   Staphylococcus aureus NEGATIVE NEGATIVE Final    Comment:        The Xpert SA Assay (FDA approved for NASAL specimens in patients over 81 years of age), is one component of a comprehensive surveillance program.  Test performance has been validated by [facility] for patients greater than or equal to 55 year old. It is not intended to diagnose infection nor to guide or monitor treatment.   Surgical pcr screen     Status: Abnormal   Collection Time: 06/27/16  4:30 AM  Result Value Ref Range Status   MRSA, PCR INVALID RESULTS, SPECIMEN SENT FOR CULTURE (A) NEGATIVE Final    Comment: RESULT CALLED TO, READ BACK BY AND VERIFIED WITH: DAVIS,L. AT 1446 ON 06/27/2016 BY AGUNDIZ,E.    Staphylococcus aureus INVALID RESULTS, SPECIMEN SENT FOR CULTURE (A) NEGATIVE Final    Comment:        The Xpert SA Assay (FDA approved for NASAL specimens in patients over 13 years of age), is one component of a comprehensive surveillance program.  Test performance has been validated by [facility] for patients greater than or equal to 33 year old. It is not intended to diagnose infection nor to guide or monitor treatment.           Radiology Studies: No results found.      Scheduled Meds: . chlorhexidine  15 mL Mouth Rinse BID  . enoxaparin (LOVENOX) injection  40 mg Subcutaneous Q24H  . mouth rinse  15 mL Mouth Rinse q12n4p  . metoprolol  5 mg Intravenous Q12H   Continuous Infusions: . dextrose 5 % and 0.45 % NaCl with KCl 20 mEq/L 100 mL/hr at 06/28/16 1324  . dextrose 5 % and 0.9% NaCl 100 mL/hr at 06/26/16 1810     LOS: 4 days    Time spent: 25 minutes. Greater than 50% of this time was spent in direct  contact with the patient coordinating care.     Lelon Frohlich, MD [facility] Pager [phone]  If 7PM-7AM, please contact night-coverage [email or web address] Password [email or web address] 06/28/2016, 3:50 PM

## 2016-06-29 LAB — BASIC METABOLIC PANEL
ANION GAP: 2 — AB (ref 5–15)
BUN: 7 mg/dL (ref 6–20)
CALCIUM: 7.5 mg/dL — AB (ref 8.9–10.3)
CO2: 29 mmol/L (ref 22–32)
Chloride: 103 mmol/L (ref 101–111)
Creatinine, Ser: 0.49 mg/dL (ref 0.44–1.00)
GLUCOSE: 97 mg/dL (ref 65–99)
Potassium: 4.6 mmol/L (ref 3.5–5.1)
SODIUM: 134 mmol/L — AB (ref 135–145)

## 2016-06-29 LAB — CBC
HCT: 36 % (ref 36.0–46.0)
Hemoglobin: 11.1 g/dL — ABNORMAL LOW (ref 12.0–15.0)
MCH: 25.9 pg — ABNORMAL LOW (ref 26.0–34.0)
MCHC: 30.8 g/dL (ref 30.0–36.0)
MCV: 84.1 fL (ref 78.0–100.0)
PLATELETS: 399 10*3/uL (ref 150–400)
RBC: 4.28 MIL/uL (ref 3.87–5.11)
RDW: 15.7 % — AB (ref 11.5–15.5)
WBC: 9.8 10*3/uL (ref 4.0–10.5)

## 2016-06-29 LAB — MRSA CULTURE: CULTURE: NEGATIVE

## 2016-06-29 NOTE — Addendum Note (Signed)
Addendum  created 06/29/16 1307 by Ollen Bowl, CRNA   Sign clinical note

## 2016-06-29 NOTE — Anesthesia Postprocedure Evaluation (Signed)
Anesthesia Post Note  Patient: Janet Hanson  Procedure(s) Performed: Procedure(s) (LRB): PARTIAL COLECTOMY (N/A)  Patient location during evaluation: Nursing Unit Anesthesia Type: General Level of consciousness: awake and alert and oriented Pain management: pain level controlled Vital Signs Assessment: post-procedure vital signs reviewed and stable Respiratory status: spontaneous breathing Cardiovascular status: blood pressure returned to baseline Postop Assessment: adequate PO intake and no signs of nausea or vomiting Anesthetic complications: no    Last Vitals:  Vitals:   06/29/16 0633 06/29/16 1017  BP: (!) 166/91 (!) 143/84  Pulse: 94 91  Resp: 18   Temp: 37.1 C     Last Pain:  Vitals:   06/29/16 0837  TempSrc:   PainSc: Asleep                 Americus Scheurich

## 2016-06-29 NOTE — Progress Notes (Signed)
PROGRESS NOTE    Janet Hanson  [id number] DOB: Oct 30, 1946 DOA: 06/24/2016 PCP: No PCP Per Patient     Brief Narrative:  [place] old woman admitted on April 22 from home due to abdominal pain, nausea, vomiting. She was found to have an obstructing colonic mass of the ascending colon. Has been seen in consultation by GI and surgery. Had hemicolectomy on 8/25. Much improved am 8/27.   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 Path still not available. -s/p right hemicolectomy on 8/25. -Likely will remove NG today (await final decision by surgery). -Surgery on board. -Awaiting oncology consultation on Monday as will need follow-up pending pathology report. -Hold all oral meds for now.  Anxiety -Hold Lexapro, diazepam given nothing by mouth state.   Hypertension -Well-controlled, hold amlodipine given nothing by mouth state. -Continue IV lopressor.   DVT prophylaxis: Lovenox Code Status: Full Code Family Communication: Patient only Disposition Plan: To be determined.   Consultants:   Surgery  GI  Procedures:   None  Antimicrobials:   None    Subjective: Abdominal pain much improved. Is hungry, would like NG out and diet advanced.  Objective: Vitals:   06/28/16 1400 06/28/16 1900 06/28/16 2206 06/29/16 0633  BP: 118/67  138/78 (!) 166/91  Pulse: 88  91 94  Resp: 18  20 18   Temp: 98.1 F (36.7 C)  98.9 F (37.2 C) 98.7 F (37.1 C)  TempSrc:   Oral Oral  SpO2: 98% 91% 92% 95%  Weight:      Height:        Intake/Output Summary (Last 24 hours) at 06/29/16 1012 Last data filed at 06/29/16 [id number]  Gross per 24 hour  Intake             1680 ml  Output             1200 ml  Net              480 ml   Filed Weights   06/24/16 1533 06/25/16 0004 06/27/16 1101  Weight: 70.3 kg (155 lb) 71.6 kg (157 lb 14.4 oz) 71.2 kg (157 lb)     Examination:  General exam: Alert, awake, oriented x 3, NG in place. Respiratory system: Clear to auscultation. Respiratory effort normal. Cardiovascular system:RRR. No murmurs, rubs, gallops. Gastrointestinal system: Abdomen is distended,  and nontender. Decreased bowel sounds. Central nervous system: Alert and oriented. No focal neurological deficits. Extremities: No C/C/E, +pedal pulses Skin: No rashes, lesions or ulcers Psychiatry: Judgement and insight appear normal. Mood & affect appropriate.     Data Reviewed: I have personally reviewed following labs and imaging studies  CBC:  Recent Labs Lab 06/24/16 1543 06/28/16 0634 06/29/16 0635  WBC 10.6* 8.2 9.8  NEUTROABS 8.4*  --   --   HGB 12.0 11.1* 11.1*  HCT 37.9 35.6* 36.0  MCV 82.6 84.6 84.1  PLT 443* 388 [id number]   Basic Metabolic Panel:  Recent Labs Lab 06/24/16 1543 06/28/16 0634 06/29/16 0635  NA 132* 135 134*  K 3.8 3.4* 4.6  CL 97* 102 103  CO2 27 27 29   GLUCOSE 113* 129* 97  BUN 15 12 7   CREATININE 0.75 0.55 0.49  CALCIUM 8.3* 6.7* 7.5*   GFR: Estimated Creatinine Clearance: 63.6 mL/min (by C-G formula based on SCr of 0.8 mg/dL). Liver Function Tests:  Recent Labs Lab 06/24/16 1543  AST 14*  ALT 10*  ALKPHOS 46  BILITOT 0.9  PROT 7.0  ALBUMIN 3.7   No results for input(s): LIPASE, AMYLASE in the last 168 hours. No results for input(s): AMMONIA in the last 168 hours. Coagulation Profile: No results for input(s): INR, PROTIME in the last 168 hours. Cardiac Enzymes: No results for input(s): CKTOTAL, CKMB, CKMBINDEX, TROPONINI in the last 168 hours. BNP (last 3 results) No results for input(s): PROBNP in the last 8760 hours. HbA1C: No results for input(s): HGBA1C in the last 72 hours. CBG: No results for input(s): GLUCAP in the last 168 hours. Lipid Profile: No results for input(s): CHOL, HDL, LDLCALC, TRIG, CHOLHDL, LDLDIRECT in the last 72 hours. Thyroid Function Tests: No results  for input(s): TSH, T4TOTAL, FREET4, T3FREE, THYROIDAB in the last 72 hours. Anemia Panel:  Recent Labs  06/26/16 1014  FERRITIN 21  TIBC 314  IRON 7*   Urine analysis:    Component Value Date/Time   COLORURINE AMBER (A) 06/24/2016 2226   APPEARANCEUR CLEAR 06/24/2016 2226   LABSPEC 1.020 06/24/2016 2226   PHURINE 5.0 06/24/2016 2226   GLUCOSEU NEGATIVE 06/24/2016 2226   HGBUR NEGATIVE 06/24/2016 2226   BILIRUBINUR SMALL (A) 06/24/2016 2226   KETONESUR 15 (A) 06/24/2016 2226   PROTEINUR 30 (A) 06/24/2016 2226   NITRITE NEGATIVE 06/24/2016 2226   LEUKOCYTESUR NEGATIVE 06/24/2016 2226   Sepsis Labs: @LABRCNTIP (procalcitonin:4,lacticidven:4)  ) Recent Results (from the past 240 hour(s))  Surgical PCR screen     Status: None   Collection Time: 06/25/16  3:00 PM  Result Value Ref Range Status   MRSA, PCR NEGATIVE NEGATIVE Final   Staphylococcus aureus NEGATIVE NEGATIVE Final    Comment:        The Xpert SA Assay (FDA approved for NASAL specimens in patients over 26 years of age), is one component of a comprehensive surveillance program.  Test performance has been validated by [facility] for patients greater than or equal to 16 year old. It is not intended to diagnose infection nor to guide or monitor treatment.   Surgical pcr screen     Status: Abnormal   Collection Time: 06/27/16  4:30 AM  Result Value Ref Range Status   MRSA, PCR INVALID RESULTS, SPECIMEN SENT FOR CULTURE (A) NEGATIVE Final    Comment: RESULT CALLED TO, READ BACK BY AND VERIFIED WITH: DAVIS,L. AT 1446 ON 06/27/2016 BY AGUNDIZ,E.    Staphylococcus aureus INVALID RESULTS, SPECIMEN SENT FOR CULTURE (A) NEGATIVE Final    Comment:        The Xpert SA Assay (FDA approved for NASAL specimens in patients over 76 years of age), is one component of a comprehensive surveillance program.  Test performance has been validated by [facility] for patients greater than or equal to 30 year old. It is not  intended to diagnose infection nor to guide or monitor treatment.          Radiology Studies: No results found.      Scheduled Meds: . chlorhexidine  15 mL Mouth Rinse BID  . enoxaparin (LOVENOX) injection  40 mg Subcutaneous Q24H  . mouth rinse  15 mL Mouth Rinse q12n4p  . metoprolol  5 mg Intravenous Q12H   Continuous Infusions: . dextrose 5 % and 0.45 % NaCl with KCl 20 mEq/L 100 mL/hr at 06/28/16 2229  . dextrose 5 % and 0.9% NaCl 100 mL/hr at 06/26/16 1810     LOS: 5 days    Time spent: 25 minutes.  Greater than 50% of this time was spent in direct contact with the patient coordinating care.     Lelon Frohlich, MD [facility] Pager [phone]  If 7PM-7AM, please contact night-coverage [email or web address] Password [email or web address] 06/29/2016, 10:12 AM

## 2016-06-29 NOTE — Progress Notes (Signed)
[place] Hospital Day(s): 5.   Post op day(s): 2 Days Post-Op.   Interval History: Patient seen and examined, no acute events or new complaints overnight. Patient reports minimal pain well-controlled, denies flatus, BM, nausea, fever/chills, CP, or SOB.  Review of Systems:  Constitutional: denies fever, chills  HEENT: denies cough or congestion  Respiratory: denies any shortness of breath  Cardiovascular: denies chest pain or palpitations  Gastrointestinal: denies N/V, diarrhea or constipation  Musculoskeletal: denies pain, decreased motor or sensation  Neurological: denies HA or vision/hearing changes   Vital signs in last 24 hours: [min-max] current  Temp:  [98.1 F (36.7 C)-98.9 F (37.2 C)] 98.7 F (37.1 C) (08/27 0633) Pulse Rate:  [88-94] 91 (08/27 1017) Resp:  [18-20] 18 (08/27 0633) BP: (118-166)/(67-91) 143/84 (08/27 1017) SpO2:  [91 %-98 %] 95 % (08/27 0633)     Height: 5\' 3"  (160 cm) Weight: 71.2 kg (157 lb) BMI (Calculated): 27.9   Intake/Output this shift:  Total I/O In: -  Out: 400 [Urine:400]   Intake/Output last 2 shifts:  @IOLAST2SHIFTS @   Physical Exam:  Constitutional: alert, cooperative and no distress  HENT: normocephalic without obvious abnormality  Eyes: PERRL, EOM's grossly intact and symmetric  Neuro: CN II - XII grossly intact and symmetric without deficit  Respiratory: breathing non-labored at rest  Cardiovascular: regular rate and sinus rhythm  Gastrointestinal: soft, non-tender, and non-distended with incision well-approximated without erythema or drainage, dressing c/d/i Musculoskeletal: UE and LE FROM, motor and sensation grossly intact, NT   Labs:  CBC:  Lab Results  Component Value Date   WBC 9.8 06/29/2016   RBC 4.28 06/29/2016   BMP:  Lab Results  Component Value Date   GLUCOSE 97 06/29/2016   CO2 29 06/29/2016   BUN 7 06/29/2016   CREATININE 0.49 06/29/2016   CALCIUM 7.5 (L) 06/29/2016     Imaging  studies: No new pertinent imaging studies    Assessment/Plan: (ICD-10's: K56.69, K56.7) 70 y.o. 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               - pain control prn, minimize narcotics              - nasogastric tube removed, will monitor bowel function   - NPO for now, likely clear liquids tomorrow and advance as tolerated once flatus              - DVT prophylaxis (Lovenox in preferred in context of likely malignancy)              - ambulation and smoking cessation encouraged   All of the above findings and recommendations were discussed with the patient and the medical team, and all of patient's questions were answered to her expressed satisfaction.  -- Marilynne Drivers Rosana Hoes, MD, [place]: [place] [facility] Office: [phone]

## 2016-06-30 ENCOUNTER — Inpatient Hospital Stay (HOSPITAL_COMMUNITY): Payer: Medicare Other

## 2016-06-30 LAB — CBC
HCT: 32.8 % — ABNORMAL LOW (ref 36.0–46.0)
HEMOGLOBIN: 10.3 g/dL — AB (ref 12.0–15.0)
MCH: 26.2 pg (ref 26.0–34.0)
MCHC: 31.4 g/dL (ref 30.0–36.0)
MCV: 83.5 fL (ref 78.0–100.0)
Platelets: 405 10*3/uL — ABNORMAL HIGH (ref 150–400)
RBC: 3.93 MIL/uL (ref 3.87–5.11)
RDW: 15.6 % — AB (ref 11.5–15.5)
WBC: 7.2 10*3/uL (ref 4.0–10.5)

## 2016-06-30 LAB — BASIC METABOLIC PANEL
Anion gap: 5 (ref 5–15)
BUN: 5 mg/dL — ABNORMAL LOW (ref 6–20)
CALCIUM: 7.5 mg/dL — AB (ref 8.9–10.3)
CHLORIDE: 102 mmol/L (ref 101–111)
CO2: 27 mmol/L (ref 22–32)
CREATININE: 0.43 mg/dL — AB (ref 0.44–1.00)
Glucose, Bld: 113 mg/dL — ABNORMAL HIGH (ref 65–99)
Potassium: 3.7 mmol/L (ref 3.5–5.1)
SODIUM: 134 mmol/L — AB (ref 135–145)

## 2016-06-30 MED ORDER — IOPAMIDOL (ISOVUE-300) INJECTION 61%
75.0000 mL | Freq: Once | INTRAVENOUS | Status: AC | PRN
  Administered 2016-06-30: 75 mL via INTRAVENOUS

## 2016-06-30 MED ORDER — IOPAMIDOL (ISOVUE-370) INJECTION 76%
100.0000 mL | Freq: Once | INTRAVENOUS | Status: AC | PRN
  Administered 2016-06-30: 100 mL via INTRAVENOUS

## 2016-06-30 NOTE — Progress Notes (Signed)
SURGICAL PROGRESS NOTE (cpt [phone])  Hospital Day(s): 6.   Post op day(s): 3 Days Post-Op.   Interval History: Patient seen and examined, no acute events or new complaints overnight. Patient reports her pain has been minimal and well-controlled and she's been ambulating (x3 yesterday), but denies yet passing flatus or BM, fever/chills, CP, or SOB.  Review of Systems:  Constitutional: denies fever, chills  HEENT: denies cough or congestion  Respiratory: denies any shortness of breath  Cardiovascular: denies chest pain or palpitations  Gastrointestinal: denies N/V, diarrhea or constipation  Musculoskeletal: denies pain, decreased motor or sensation  Neurological: denies HA or vision/hearing changes   Vital signs in last 24 hours: [min-max] current  Temp:  [98.3 F (36.8 C)-98.9 F (37.2 C)] 98.3 F (36.8 C) (08/28 0525) Pulse Rate:  [87-92] 89 (08/28 0525) Resp:  [16-18] 16 (08/28 0525) BP: (136-143)/(84-99) 143/87 (08/28 0525) SpO2:  [95 %-96 %] 95 % (08/28 0525)     Height: 5\' 3"  (160 cm) Weight: 71.2 kg (157 lb) BMI (Calculated): 27.9   Intake/Output this shift:  No intake/output data recorded.   Intake/Output last 2 shifts:  @IOLAST2SHIFTS @   Physical Exam:  Constitutional: alert, cooperative and no distress  HENT: normocephalic without obvious abnormality  Eyes: PERRL, EOM's grossly intact and symmetric  Neuro: CN II - XII grossly intact and symmetric without deficit  Respiratory: breathing non-labored at rest  Cardiovascular: regular rate and sinus rhythm  Gastrointestinal: soft, mild peri-incisional tenderness to palpation, minimal distention  Musculoskeletal: UE and LE FROM, motor and sensation grossly intact, NT   Labs:  CBC:  Lab Results  Component Value Date   WBC 7.2 06/30/2016   RBC 3.93 06/30/2016   BMP:  Lab Results  Component Value Date   GLUCOSE 113 (H) 06/30/2016   CO2 27 06/30/2016   BUN 5 (L) 06/30/2016   CREATININE 0.43 (L) 06/30/2016   CALCIUM 7.5 (L) 06/30/2016     Imaging studies: No new pertinent imaging studies    Assessment/Plan:(ICD-10's: K56.69, K56.7) 70 y.o.femalewithpost-operative ileus, otherwise doing well 3 Days Post-Ops/p 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  - monitor bowel function - pain control prn, minimize narcotics              - okay with hard candies, gum, and ice chips; once flatus, advance as tolerated - DVT prophylaxis (Lovenox in preferred in context of likely malignancy) - ambulation and smoking cessation encouraged   - follow-up pending surgical pathology  All of the above findings and recommendations were discussed with the patient and the medical team, and all of patient's questions were answered to her expressed satisfaction.  -- Marilynne Drivers Rosana Hoes, MD, [place]: [place] and [facility] Office: [phone]

## 2016-06-30 NOTE — Care Management Important Message (Signed)
Important Message  Patient Details  Name: NANNETTE HUN MRN: [id number] Date of Birth: 08-28-1946   Medicare Important Message Given:  Yes    Sherald Barge, RN 06/30/2016, 2:17 PM

## 2016-06-30 NOTE — Progress Notes (Signed)
PROGRESS NOTE    Janet Hanson  [id number] DOB: 04/17/1946 DOA: 06/24/2016 PCP: No PCP Per Patient     Brief Narrative:  [place] old woman admitted on April 22 from home due to abdominal pain, nausea, vomiting. She was found to have an obstructing colonic mass of the ascending colon. Has been seen in consultation by GI and surgery. Had hemicolectomy on 8/25.    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 Path still not available. -s/p right hemicolectomy on 8/25. -NG removed. -ice chips for now. -Surgery on board. -Awaiting oncology consultation.  Anxiety -Hold Lexapro, diazepam given nothing by mouth state.   Hypertension -Well-controlled, hold amlodipine given nothing by mouth state. -Continue IV lopressor.   DVT prophylaxis: Lovenox Code Status: Full Code Family Communication: Patient only Disposition Plan: To be determined.   Consultants:   Surgery  GI  Procedures:   None  Antimicrobials:   None    Subjective: Ambulating, pain improved, no flatus or BM yet.  Objective: Vitals:   06/29/16 1941 06/30/16 0525 06/30/16 1026 06/30/16 1300  BP: (!) 136/99 (!) 143/87 129/79 137/81  Pulse: 87 89 89 91  Resp: 18 16  18   Temp: 98.9 F (37.2 C) 98.3 F (36.8 C)  98.5 F (36.9 C)  TempSrc: Oral Oral  Oral  SpO2: 96% 95%  97%  Weight:      Height:        Intake/Output Summary (Last 24 hours) at 06/30/16 1601 Last data filed at 06/30/16 [id number]  Gross per 24 hour  Intake                0 ml  Output             1600 ml  Net            -1600 ml   Filed Weights   06/24/16 1533 06/25/16 0004 06/27/16 1101  Weight: 70.3 kg (155 lb) 71.6 kg (157 lb 14.4 oz) 71.2 kg (157 lb)    Examination:  General exam: Alert, awake, oriented x 3, NG in place. Respiratory system: Clear to auscultation. Respiratory effort normal. Cardiovascular system:RRR. No  murmurs, rubs, gallops. Gastrointestinal system: Abdomen is distended,  and nontender. Decreased bowel sounds. Central nervous system: Alert and oriented. No focal neurological deficits. Extremities: No C/C/E, +pedal pulses Skin: No rashes, lesions or ulcers Psychiatry: Judgement and insight appear normal. Mood & affect appropriate.     Data Reviewed: I have personally reviewed following labs and imaging studies  CBC:  Recent Labs Lab 06/24/16 1543 06/28/16 0634 06/29/16 0635 06/30/16 0605  WBC 10.6* 8.2 9.8 7.2  NEUTROABS 8.4*  --   --   --   HGB 12.0 11.1* 11.1* 10.3*  HCT 37.9 35.6* 36.0 32.8*  MCV 82.6 84.6 84.1 83.5  PLT 443* 388 399 [id number]*   Basic Metabolic Panel:  Recent Labs Lab 06/24/16 1543 06/28/16 0634 06/29/16 0635 06/30/16 0605  NA 132* 135 134* 134*  K 3.8 3.4* 4.6 3.7  CL 97* 102 103 102  CO2 27 27 29 27   GLUCOSE 113* 129* 97 113*  BUN 15 12 7  5*  CREATININE 0.75 0.55 0.49 0.43*  CALCIUM 8.3* 6.7* 7.5* 7.5*   GFR: Estimated Creatinine Clearance: 63.6 mL/min (by C-G formula based on SCr of 0.8 mg/dL). Liver Function Tests:  Recent Labs Lab 06/24/16 1543  AST 14*  ALT  10*  ALKPHOS 46  BILITOT 0.9  PROT 7.0  ALBUMIN 3.7   No results for input(s): LIPASE, AMYLASE in the last 168 hours. No results for input(s): AMMONIA in the last 168 hours. Coagulation Profile: No results for input(s): INR, PROTIME in the last 168 hours. Cardiac Enzymes: No results for input(s): CKTOTAL, CKMB, CKMBINDEX, TROPONINI in the last 168 hours. BNP (last 3 results) No results for input(s): PROBNP in the last 8760 hours. HbA1C: No results for input(s): HGBA1C in the last 72 hours. CBG: No results for input(s): GLUCAP in the last 168 hours. Lipid Profile: No results for input(s): CHOL, HDL, LDLCALC, TRIG, CHOLHDL, LDLDIRECT in the last 72 hours. Thyroid Function Tests: No results for input(s): TSH, T4TOTAL, FREET4, T3FREE, THYROIDAB in the last 72 hours. Anemia  Panel: No results for input(s): VITAMINB12, FOLATE, FERRITIN, TIBC, IRON, RETICCTPCT in the last 72 hours. Urine analysis:    Component Value Date/Time   COLORURINE AMBER (A) 06/24/2016 2226   APPEARANCEUR CLEAR 06/24/2016 2226   LABSPEC 1.020 06/24/2016 2226   PHURINE 5.0 06/24/2016 2226   GLUCOSEU NEGATIVE 06/24/2016 2226   HGBUR NEGATIVE 06/24/2016 2226   BILIRUBINUR SMALL (A) 06/24/2016 2226   KETONESUR 15 (A) 06/24/2016 2226   PROTEINUR 30 (A) 06/24/2016 2226   NITRITE NEGATIVE 06/24/2016 2226   LEUKOCYTESUR NEGATIVE 06/24/2016 2226   Sepsis Labs: @LABRCNTIP (procalcitonin:4,lacticidven:4)  ) Recent Results (from the past 240 hour(s))  Surgical PCR screen     Status: None   Collection Time: 06/25/16  3:00 PM  Result Value Ref Range Status   MRSA, PCR NEGATIVE NEGATIVE Final   Staphylococcus aureus NEGATIVE NEGATIVE Final    Comment:        The Xpert SA Assay (FDA approved for NASAL specimens in patients over 65 years of age), is one component of a comprehensive surveillance program.  Test performance has been validated by [facility] for patients greater than or equal to 43 year old. It is not intended to diagnose infection nor to guide or monitor treatment.   Surgical pcr screen     Status: Abnormal   Collection Time: 06/27/16  4:30 AM  Result Value Ref Range Status   MRSA, PCR INVALID RESULTS, SPECIMEN SENT FOR CULTURE (A) NEGATIVE Final    Comment: RESULT CALLED TO, READ BACK BY AND VERIFIED WITH: DAVIS,L. AT 1446 ON 06/27/2016 BY AGUNDIZ,E.    Staphylococcus aureus INVALID RESULTS, SPECIMEN SENT FOR CULTURE (A) NEGATIVE Final    Comment:        The Xpert SA Assay (FDA approved for NASAL specimens in patients over 60 years of age), is one component of a comprehensive surveillance program.  Test performance has been validated by [facility] for patients greater than or equal to 60 year old. It is not intended to diagnose infection nor to guide or  monitor treatment.   MRSA culture     Status: None   Collection Time: 06/27/16  4:30 AM  Result Value Ref Range Status   Specimen Description NOSE  Final   Special Requests NONE  Final   Culture NEGATIVE Performed at [facility]   Final   Report Status 06/29/2016 FINAL  Final         Radiology Studies: Ct Chest W Contrast  Result Date: 06/30/2016 CLINICAL DATA:  Malignant colon mass. EXAM: CT CHEST WITH CONTRAST TECHNIQUE: Multidetector CT imaging of the chest was performed during intravenous contrast administration. CONTRAST:  26mL ISOVUE-300 IOPAMIDOL (ISOVUE-300) INJECTION 61% COMPARISON:  None.  FINDINGS: Cardiovascular: Mild cardiac enlargement. There is no pericardial effusion. Aortic atherosclerosis noted. Calcification involving the LAD and left circumflex coronary arteries noted. There is a suggestion of a filling defect within the right lower lobe pulmonary artery, image number 86 of series 5. Mediastinum/Nodes: The trachea appears patent and is midline. Normal appearance of the esophagus. No enlarged mediastinal or hilar lymph nodes. No axillary or supraclavicular adenopathy. Lungs/Pleura: Bilateral pleural effusions identified right greater in left. Overlying compressive type atelectasis and consolidation noted. A few patchy areas of ground-glass attenuation noted within the upper lobes. Nonspecific pulmonary nodule in left upper lobe measures 3 mm, image 30 of series 4. Upper Abdomen: Pneumoperitoneum is noted, likely secondary to recent surgery. A small amount of fluid identified around the liver. The visualized portions of the liver, spleen, adrenal glands. 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 IMPRESSION: 1. No specific findings identified to suggest metastatic disease to the chest. 2. There is a 3 mm nodule identified within the left upper lobe, nonspecific. 3. Bilateral pleural effusions with overlying compressive type atelectasis and  consolidation. 4. Suggestion of filling defect within right lower lobe pulmonary artery. Note examination is not optimized to assess the pulmonary arteries for pulmonary embolus. Consider further evaluation with CT of the chest. These results were called by telephone at the time of interpretation on 06/30/2016 at 10:25 am to Dr. Robynn Pane , who verbally acknowledged these results. Electronically Signed   By: Kerby Moors M.D.   On: 06/30/2016 10:25        Scheduled Meds: . chlorhexidine  15 mL Mouth Rinse BID  . enoxaparin (LOVENOX) injection  40 mg Subcutaneous Q24H  . mouth rinse  15 mL Mouth Rinse q12n4p  . metoprolol  5 mg Intravenous Q12H   Continuous Infusions: . dextrose 5 % and 0.45 % NaCl with KCl 20 mEq/L 100 mL/hr at 06/30/16 0517  . dextrose 5 % and 0.9% NaCl 100 mL/hr at 06/26/16 1810     LOS: 6 days    Time spent: 25 minutes. Greater than 50% of this time was spent in direct contact with the patient coordinating care.     Lelon Frohlich, MD [facility] Pager [phone]  If 7PM-7AM, please contact night-coverage [email or web address] Password [facility] 06/30/2016, 4:01 PM

## 2016-06-30 NOTE — Progress Notes (Signed)
Pt sitting on side of bed with family at bedside. C/O pain and discomfort to abdomen, PRN 2mg  Morphine IV given. Will continue to monitor.

## 2016-07-01 DIAGNOSIS — I2602 Saddle embolus of pulmonary artery with acute cor pulmonale: Secondary | ICD-10-CM

## 2016-07-01 DIAGNOSIS — I2699 Other pulmonary embolism without acute cor pulmonale: Secondary | ICD-10-CM

## 2016-07-01 LAB — BASIC METABOLIC PANEL
Anion gap: 6 (ref 5–15)
CALCIUM: 7.8 mg/dL — AB (ref 8.9–10.3)
CO2: 28 mmol/L (ref 22–32)
CREATININE: 0.48 mg/dL (ref 0.44–1.00)
Chloride: 100 mmol/L — ABNORMAL LOW (ref 101–111)
GFR calc Af Amer: >60 mL/min (ref >60)
GLUCOSE: 109 mg/dL — AB (ref 65–99)
Potassium: 3.6 mmol/L (ref 3.5–5.1)
Sodium: 134 mmol/L — ABNORMAL LOW (ref 135–145)

## 2016-07-01 LAB — CBC
HCT: 33.3 % — ABNORMAL LOW (ref 36.0–46.0)
Hemoglobin: 10.4 g/dL — ABNORMAL LOW (ref 12.0–15.0)
MCH: 26 pg (ref 26.0–34.0)
MCHC: 31.2 g/dL (ref 30.0–36.0)
MCV: 83.3 fL (ref 78.0–100.0)
PLATELETS: 408 10*3/uL — AB (ref 150–400)
RBC: 4 MIL/uL (ref 3.87–5.11)
RDW: 15.6 % — AB (ref 11.5–15.5)
WBC: 6.3 10*3/uL (ref 4.0–10.5)

## 2016-07-01 LAB — HEPARIN LEVEL (UNFRACTIONATED): Heparin Unfractionated: <0.1 IU/mL — ABNORMAL LOW (ref 0.30–0.70)

## 2016-07-01 MED ORDER — HEPARIN (PORCINE) IN NACL 100-0.45 UNIT/ML-% IJ SOLN
1300.0000 [IU]/h | INTRAMUSCULAR | Status: DC
  Administered 2016-07-01: 1100 [IU]/h via INTRAVENOUS
  Filled 2016-07-01 (×2): qty 250

## 2016-07-01 MED ORDER — HEPARIN BOLUS VIA INFUSION
5000.0000 [IU] | Freq: Once | INTRAVENOUS | Status: AC
  Administered 2016-07-01: 5000 [IU] via INTRAVENOUS
  Filled 2016-07-01: qty 5000

## 2016-07-01 MED ORDER — HEPARIN BOLUS VIA INFUSION
2000.0000 [IU] | Freq: Once | INTRAVENOUS | Status: AC
Start: 2016-07-01 — End: 2016-07-01
  Administered 2016-07-01: 2000 [IU] via INTRAVENOUS
  Filled 2016-07-01: qty 2000

## 2016-07-01 NOTE — Progress Notes (Signed)
[facility] Hospital Day(s): 7.   Post op day(s): 4 Days Post-Op.   Interval History: Patient seen and examined, no acute events or new complaints overnight. Patient reports she continues to pass flatus, ambulate, and has been tolerating clear liquids diet since last night without any abdominal pain, nausea, or abdominal distention. She also denies any fever/chills, CP, or SOB.  Review of Systems:  Constitutional: denies fever, chills  HEENT: denies cough or congestion  Respiratory: denies any shortness of breath  Cardiovascular: denies chest pain or palpitations  Gastrointestinal: denies N/V, diarrhea or constipation  Musculoskeletal: denies pain, decreased motor or sensation  Neurological: denies HA or vision/hearing changes   Vital signs in last 24 hours: [min-max] current  Temp:  [98.5 F (36.9 C)-98.8 F (37.1 C)] 98.7 F (37.1 C) (08/29 0627) Pulse Rate:  [87-91] 89 (08/29 0627) Resp:  [18] 18 (08/29 0627) BP: (129-146)/(79-84) 146/84 (08/29 0627) SpO2:  [94 %-97 %] 94 % (08/29 0627)     Height: 5\' 3"  (160 cm) Weight: 71.2 kg (157 lb) BMI (Calculated): 27.9   Intake/Output this shift:  No intake/output data recorded.   Intake/Output last 2 shifts:  @IOLAST2SHIFTS @   Physical Exam:  Constitutional: alert, cooperative and no distress  HENT: normocephalic without obvious abnormality  Eyes: PERRL, EOM's grossly intact and symmetric  Neuro: CN II - XII grossly intact and symmetric without deficit  Respiratory: breathing non-labored at rest  Cardiovascular: regular rate and sinus rhythm  Gastrointestinal: soft, completely non-tender, and non-distended  Musculoskeletal: UE and LE FROM, motor and sensation grossly intact, NT   Labs:  CBC:  Lab Results  Component Value Date   WBC 6.3 07/01/2016   RBC 4.00 07/01/2016   BMP:  Lab Results  Component Value Date   GLUCOSE 109 (H) 07/01/2016   CO2 28 07/01/2016   BUN <5 (L) 07/01/2016   CREATININE 0.48  07/01/2016   CALCIUM 7.8 (L) 07/01/2016     Imaging studies: No new pertinent imaging studies    Assessment/Plan:(ICD-10's: K56.69, K56.7) 69 y.o.femalewithpost-operative ileus, otherwise doing well 4Days Post-Ops/p 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  - advance to regular diet - pain control prn, minimize narcotics - DVT prophylaxis (Lovenox in preferred in context of likely malignancy) - ambulation and smoking cessation encouraged              - follow-up pending surgical pathology   - discharge planning  All of the above findings and recommendations were discussed with the patient and the medical team, and all of patient's questions were answered to her expressed satisfaction.  -- Marilynne Drivers Rosana Hoes, MD, [place] Office: [phone]

## 2016-07-01 NOTE — Progress Notes (Addendum)
ANTICOAGULATION CONSULT NOTE - Initial Consult  Pharmacy Consult for HEPARIN Indication: pulmonary embolus  No Known Allergies  Patient Measurements: Height: 5\' 3"  (160 cm) Weight: 157 lb (71.2 kg) IBW/kg (Calculated) : 52.4 HEPARIN DW (KG): 67.2  Vital Signs: Temp: 98.7 F (37.1 C) (08/29 0627) Temp Source: Oral (08/29 0627) BP: 134/87 (08/29 1045) Pulse Rate: 85 (08/29 1045)  Labs:  Recent Labs  06/29/16 0635 06/30/16 0605 07/01/16 0617  HGB 11.1* 10.3* 10.4*  HCT 36.0 32.8* 33.3*  PLT 399 405* 408*  CREATININE 0.49 0.43* 0.48   Estimated Creatinine Clearance: 63.6 mL/min (by C-G formula based on SCr of 0.8 mg/dL).  Medical History: Past Medical History:  Diagnosis Date  . Hypercholesterolemia   . Hypertension    Medications:  Prescriptions Prior to Admission  Medication Sig Dispense Refill Last Dose  . amLODipine (NORVASC) 5 MG tablet Take 5 mg by mouth daily.   06/24/2016 at Unknown time  . escitalopram (LEXAPRO) 10 MG tablet Take 10 mg by mouth daily.   06/24/2016 at Unknown time  . Omega-3 Fatty Acids (FISH OIL) 1200 MG CAPS Take 1 capsule by mouth daily.   06/24/2016  . simvastatin (ZOCOR) 40 MG tablet Take 40 mg by mouth daily.   06/24/2016 at Unknown time  . ciprofloxacin (CIPRO) 500 MG tablet Take 500 mg by mouth 2 (two) times daily.   Completed Course at Unknown time  . metroNIDAZOLE (FLAGYL) 500 MG tablet Take 500 mg by mouth 2 (two) times daily.   Completed Course at Unknown time    Assessment: 70yo female with n/v, abdominal pain.  Found to have obstructing colonic mass of ascending colon.  Reportedly had hemicolectomy on 8/25.  Now asked to start Heparin for suspected PE.  Goal of Therapy:  Heparin level 0.3-0.7 units/ml Monitor platelets by anticoagulation protocol: Yes   Plan:   Heparin 5000 units IV now x 1  Heparin infusion at 1100 units/hr  Heparin level in 6-8 hrs then daily  CBC daily while on Heparin   Hall, Scott  A 07/01/2016,2:40 PM  Addum:  Initial heparin level is subtherapeutic. Will rebolus with 2000 units and increase drip to 1300 units/hr.  F/u am labs Excell Seltzer, PharmD

## 2016-07-01 NOTE — Progress Notes (Signed)
PROGRESS NOTE    Janet Hanson  [id number] DOB: 04-17-1946 DOA: 06/24/2016 PCP: No PCP Per Patient     Brief Narrative:  [place] old woman admitted on April 22 from home due to abdominal pain, nausea, vomiting. She was found to have an obstructing colonic mass of the ascending colon. Has been seen in consultation by GI and surgery. Had hemicolectomy on 8/25.  Had staging CT and received call from radiology concerning a filling defect that may represent PE, radiology recommended a CT angio that does confirm a PE.  Assessment & Plan:   Active Problems:   Colonic mass   Anxiety   HTN (hypertension)   Acute pulmonary embolism (HCC)   Obstructing colonic mass -Likely represents malignancy. Path still not available. -s/p right hemicolectomy on 8/25. -Diet advanced. -Surgery on board. -Will need follow up with oncology once path available for treatment plan.  Anxiety -Mood stable.  Hypertension -Well-controlled.  Acute PE -Will start on heparin drip for now. -Will need to start on oral anticoagulation (preferrably a NOAC) once ok with surgery.   DVT prophylaxis: full Code Status: Full Code Family Communication: Patient only Disposition Plan: To be determined.   Consultants:   Surgery  GI  Procedures:   None  Antimicrobials:   None    Subjective: Diet advanced, feels much better.  Objective: Vitals:   06/30/16 1942 06/30/16 2233 07/01/16 0627 07/01/16 1045  BP:  138/79 (!) 146/84 134/87  Pulse:  87 89 85  Resp:  18 18   Temp:  98.8 F (37.1 C) 98.7 F (37.1 C)   TempSrc:  Oral Oral   SpO2: 96% 97% 94%   Weight:      Height:        Intake/Output Summary (Last 24 hours) at 07/01/16 1432 Last data filed at 07/01/16 0900  Gross per 24 hour  Intake              240 ml  Output                0 ml  Net              240 ml   Filed Weights   06/24/16 1533 06/25/16 0004 06/27/16 1101  Weight: 70.3 kg (155 lb) 71.6 kg (157 lb 14.4 oz) 71.2 kg (157  lb)    Examination:  General exam: Alert, awake, oriented x 3. Respiratory system: Clear to auscultation. Respiratory effort normal. Cardiovascular system:RRR. No murmurs, rubs, gallops. Gastrointestinal system: Abdomen is distended,  and nontender. Normal bowel sounds. Central nervous system: Alert and oriented. No focal neurological deficits. Extremities: No C/C/E, +pedal pulses Skin: No rashes, lesions or ulcers Psychiatry: Judgement and insight appear normal. Mood & affect appropriate.     Data Reviewed: I have personally reviewed following labs and imaging studies  CBC:  Recent Labs Lab 06/24/16 1543 06/28/16 0634 06/29/16 0635 06/30/16 0605 07/01/16 0617  WBC 10.6* 8.2 9.8 7.2 6.3  NEUTROABS 8.4*  --   --   --   --   HGB 12.0 11.1* 11.1* 10.3* 10.4*  HCT 37.9 35.6* 36.0 32.8* 33.3*  MCV 82.6 84.6 84.1 83.5 83.3  PLT 443* 388 399 405* 123XX123*   Basic Metabolic Panel:  Recent Labs Lab 06/24/16 1543 06/28/16 0634 06/29/16 0635 06/30/16 0605 07/01/16 0617  NA 132* 135 134* 134* 134*  K 3.8 3.4* 4.6 3.7 3.6  CL 97* 102 103 102 100*  CO2 27 27 29 27 28   GLUCOSE 113*  129* 97 113* 109*  BUN 15 12 7  5* <5*  CREATININE 0.75 0.55 0.49 0.43* 0.48  CALCIUM 8.3* 6.7* 7.5* 7.5* 7.8*   GFR: Estimated Creatinine Clearance: 63.6 mL/min (by C-G formula based on SCr of 0.8 mg/dL). Liver Function Tests:  Recent Labs Lab 06/24/16 1543  AST 14*  ALT 10*  ALKPHOS 46  BILITOT 0.9  PROT 7.0  ALBUMIN 3.7   No results for input(s): LIPASE, AMYLASE in the last 168 hours. No results for input(s): AMMONIA in the last 168 hours. Coagulation Profile: No results for input(s): INR, PROTIME in the last 168 hours. Cardiac Enzymes: No results for input(s): CKTOTAL, CKMB, CKMBINDEX, TROPONINI in the last 168 hours. BNP (last 3 results) No results for input(s): PROBNP in the last 8760 hours. HbA1C: No results for input(s): HGBA1C in the last 72 hours. CBG: No results for  input(s): GLUCAP in the last 168 hours. Lipid Profile: No results for input(s): CHOL, HDL, LDLCALC, TRIG, CHOLHDL, LDLDIRECT in the last 72 hours. Thyroid Function Tests: No results for input(s): TSH, T4TOTAL, FREET4, T3FREE, THYROIDAB in the last 72 hours. Anemia Panel: No results for input(s): VITAMINB12, FOLATE, FERRITIN, TIBC, IRON, RETICCTPCT in the last 72 hours. Urine analysis:    Component Value Date/Time   COLORURINE AMBER (A) 06/24/2016 2226   APPEARANCEUR CLEAR 06/24/2016 2226   LABSPEC 1.020 06/24/2016 2226   PHURINE 5.0 06/24/2016 2226   GLUCOSEU NEGATIVE 06/24/2016 2226   HGBUR NEGATIVE 06/24/2016 2226   BILIRUBINUR SMALL (A) 06/24/2016 2226   KETONESUR 15 (A) 06/24/2016 2226   PROTEINUR 30 (A) 06/24/2016 2226   NITRITE NEGATIVE 06/24/2016 2226   LEUKOCYTESUR NEGATIVE 06/24/2016 2226   Sepsis Labs: @LABRCNTIP (procalcitonin:4,lacticidven:4)  ) Recent Results (from the past 240 hour(s))  Surgical PCR screen     Status: None   Collection Time: 06/25/16  3:00 PM  Result Value Ref Range Status   MRSA, PCR NEGATIVE NEGATIVE Final   Staphylococcus aureus NEGATIVE NEGATIVE Final    Comment:        The Xpert SA Assay (FDA approved for NASAL specimens in patients over 49 years of age), is one component of a comprehensive surveillance program.  Test performance has been validated by [facility] for patients greater than or equal to 44 year old. It is not intended to diagnose infection nor to guide or monitor treatment.   Surgical pcr screen     Status: Abnormal   Collection Time: 06/27/16  4:30 AM  Result Value Ref Range Status   MRSA, PCR INVALID RESULTS, SPECIMEN SENT FOR CULTURE (A) NEGATIVE Final    Comment: RESULT CALLED TO, READ BACK BY AND VERIFIED WITH: DAVIS,L. AT 1446 ON 06/27/2016 BY AGUNDIZ,E.    Staphylococcus aureus INVALID RESULTS, SPECIMEN SENT FOR CULTURE (A) NEGATIVE Final    Comment:        The Xpert SA Assay (FDA approved for NASAL  specimens in patients over 21 years of age), is one component of a comprehensive surveillance program.  Test performance has been validated by [facility] for patients greater than or equal to 68 year old. It is not intended to diagnose infection nor to guide or monitor treatment.   MRSA culture     Status: None   Collection Time: 06/27/16  4:30 AM  Result Value Ref Range Status   Specimen Description NOSE  Final   Special Requests NONE  Final   Culture NEGATIVE Performed at [facility]   Final   Report Status 06/29/2016  FINAL  Final         Radiology Studies: Ct Chest W Contrast  Result Date: 06/30/2016 CLINICAL DATA:  Malignant colon mass. EXAM: CT CHEST WITH CONTRAST TECHNIQUE: Multidetector CT imaging of the chest was performed during intravenous contrast administration. CONTRAST:  65mL ISOVUE-300 IOPAMIDOL (ISOVUE-300) INJECTION 61% COMPARISON:  None. FINDINGS: Cardiovascular: Mild cardiac enlargement. There is no pericardial effusion. Aortic atherosclerosis noted. Calcification involving the LAD and left circumflex coronary arteries noted. There is a suggestion of a filling defect within the right lower lobe pulmonary artery, image number 86 of series 5. Mediastinum/Nodes: The trachea appears patent and is midline. Normal appearance of the esophagus. No enlarged mediastinal or hilar lymph nodes. No axillary or supraclavicular adenopathy. Lungs/Pleura: Bilateral pleural effusions identified right greater in left. Overlying compressive type atelectasis and consolidation noted. A few patchy areas of ground-glass attenuation noted within the upper lobes. Nonspecific pulmonary nodule in left upper lobe measures 3 mm, image 30 of series 4. Upper Abdomen: Pneumoperitoneum is noted, likely secondary to recent surgery. A small amount of fluid identified around the liver. The visualized portions of the liver, spleen, adrenal glands. Musculoskeletal: Degenerative disc disease noted  within the thoracic spine. IMPRESSION: 1. No specific findings identified to suggest metastatic disease to the chest. 2. There is a 3 mm nodule identified within the left upper lobe, nonspecific. 3. Bilateral pleural effusions with overlying compressive type atelectasis and consolidation. 4. Suggestion of filling defect within right lower lobe pulmonary artery. Note examination is not optimized to assess the pulmonary arteries for pulmonary embolus. Consider further evaluation with CT of the chest. These results were called by telephone at the time of interpretation on 06/30/2016 at 10:25 am to Dr. Robynn Pane , who verbally acknowledged these results. Electronically Signed   By: Kerby Moors M.D.   On: 06/30/2016 10:25   Ct Angio Chest Pe W Or Wo Contrast  Result Date: 06/30/2016 CLINICAL DATA:  Possible pulmonary embolus suggested on previous routine CT of the chest done earlier today. EXAM: CT ANGIOGRAPHY CHEST WITH CONTRAST TECHNIQUE: Multidetector CT imaging of the chest was performed using the standard protocol during bolus administration of intravenous contrast. Multiplanar CT image reconstructions and MIPs were obtained to evaluate the vascular anatomy. CONTRAST:  100 mL Isovue 370 COMPARISON:  06/30/2016 FINDINGS: Technically adequate study with good opacification of the central and segmental pulmonary arteries. Filling defects are demonstrated central right upper lobe and right lower lobe branches consistent with nonocclusive acute pulmonary embolus. This corresponds to abnormality seen on previous study. RV to LV ratio is normal suggesting no evidence of right heart strain. Normal heart size. Normal caliber thoracic aorta. Coronary artery calcifications. No aortic dissection. Great vessel origins are patent. Esophagus is decompressed. No significant lymphadenopathy in the chest. Mediastinal lymph nodes are not pathologically enlarged. Small bilateral pleural effusions with basilar atelectasis,  greater on the right. This is unchanged since prior study. Evaluation of lungs is limited due to motion artifact. Patchy ground-glass areas and nodule seen on previous study are not well depicted on the current study. No pneumothorax. Included portions of the upper abdominal organs demonstrate minimal upper abdominal ascites. Degenerative changes in spine. No destructive bone lesions. Review of the MIP images confirms the above findings. IMPRESSION: Positive examination for right upper lobe and right lower lobe pulmonary emboli. No evidence of right heart strain. Again demonstrated are bilateral pleural effusions and basilar atelectasis, greater on the right. These results were called by telephone at the time of interpretation  on 06/30/2016 at 6:42 pm to [facility], the patient's nurse on Dept 300, who verbally acknowledged these results. Electronically Signed   By: Lucienne Capers M.D.   On: 06/30/2016 18:49        Scheduled Meds: . chlorhexidine  15 mL Mouth Rinse BID  . enoxaparin (LOVENOX) injection  40 mg Subcutaneous Q24H  . mouth rinse  15 mL Mouth Rinse q12n4p  . metoprolol  5 mg Intravenous Q12H   Continuous Infusions: . dextrose 5 % and 0.45 % NaCl with KCl 20 mEq/L 100 mL/hr at 07/01/16 0505  . dextrose 5 % and 0.9% NaCl 100 mL/hr at 06/26/16 1810     LOS: 7 days    Time spent: 25 minutes. Greater than 50% of this time was spent in direct contact with the patient coordinating care.     Lelon Frohlich, MD [facility] Pager [phone]  If 7PM-7AM, please contact night-coverage [email or web address] Password [facility] 07/01/2016, 2:32 PM

## 2016-07-02 ENCOUNTER — Encounter (HOSPITAL_COMMUNITY): Payer: Self-pay | Admitting: Surgery

## 2016-07-02 LAB — CBC
HEMATOCRIT: 31.5 % — AB (ref 36.0–46.0)
Hemoglobin: 9.8 g/dL — ABNORMAL LOW (ref 12.0–15.0)
MCH: 25.9 pg — ABNORMAL LOW (ref 26.0–34.0)
MCHC: 31.1 g/dL (ref 30.0–36.0)
MCV: 83.1 fL (ref 78.0–100.0)
PLATELETS: 406 10*3/uL — AB (ref 150–400)
RBC: 3.79 MIL/uL — AB (ref 3.87–5.11)
RDW: 15.9 % — AB (ref 11.5–15.5)
WBC: 9.8 10*3/uL (ref 4.0–10.5)

## 2016-07-02 LAB — BASIC METABOLIC PANEL
ANION GAP: 6 (ref 5–15)
BUN: 5 mg/dL — ABNORMAL LOW (ref 6–20)
CALCIUM: 7.9 mg/dL — AB (ref 8.9–10.3)
CO2: 28 mmol/L (ref 22–32)
Chloride: 101 mmol/L (ref 101–111)
Creatinine, Ser: 0.57 mg/dL (ref 0.44–1.00)
GLUCOSE: 100 mg/dL — AB (ref 65–99)
POTASSIUM: 3.7 mmol/L (ref 3.5–5.1)
Sodium: 135 mmol/L (ref 135–145)

## 2016-07-02 LAB — HEPARIN LEVEL (UNFRACTIONATED): Heparin Unfractionated: 0.32 IU/mL (ref 0.30–0.70)

## 2016-07-02 MED ORDER — RIVAROXABAN 15 MG PO TABS
15.0000 mg | ORAL_TABLET | Freq: Two times a day (BID) | ORAL | Status: DC
  Administered 2016-07-02 – 2016-07-03 (×3): 15 mg via ORAL
  Filled 2016-07-02 (×3): qty 1

## 2016-07-02 MED ORDER — RIVAROXABAN 15 MG PO TABS: 15.0000 mg | ORAL_TABLET | Freq: Two times a day (BID) | ORAL | Status: DC

## 2016-07-02 NOTE — Care Management Important Message (Signed)
Important Message  Patient Details  Name: Janet Hanson MRN: [id number] Date of Birth: 1946/05/27   Medicare Important Message Given:  Yes    Sherald Barge, RN 07/02/2016, 3:19 PM

## 2016-07-02 NOTE — Progress Notes (Signed)
[place] Hospital Day(s): 8.   Post op day(s): 5 Days Post-Op.   Interval History: Patient seen and examined, no acute events or new complaints overnight. Patient reports she's been passing flatus + BM, tolerating regular diet, and ambulating with minimal abdominal pain well-controlled without nausea, fever/chills, CP, or SOB.  Review of Systems:  Constitutional: denies fever, chills  HEENT: denies cough or congestion  Respiratory: denies any shortness of breath  Cardiovascular: denies chest pain or palpitations  Gastrointestinal: denies N/V, diarrhea or constipation  Musculoskeletal: denies pain, decreased motor or sensation  Neurological: denies HA or vision/hearing changes   Vital signs in last 24 hours: [min-max] current  Temp:  [97.6 F (36.4 C)-98.8 F (37.1 C)] 98.5 F (36.9 C) (08/30 0634) Pulse Rate:  [85-92] 88 (08/30 0634) Resp:  [18] 18 (08/30 0634) BP: (132-144)/(70-89) 140/89 (08/30 0634) SpO2:  [96 %-100 %] 96 % (08/30 0634)     Height: 5\' 3"  (160 cm) Weight: 71.2 kg (157 lb) BMI (Calculated): 27.9   Intake/Output this shift:  No intake/output data recorded.   Intake/Output last 2 shifts:  @IOLAST2SHIFTS @   Physical Exam:  Constitutional: alert, cooperative and no distress  HENT: normocephalic without obvious abnormality  Eyes: PERRL, EOM's grossly intact and symmetric  Neuro: CN II - XII grossly intact and symmetric without deficit  Respiratory: breathing non-labored at rest  Cardiovascular: regular rate and sinus rhythm  Gastrointestinal: soft, non-tender, and non-distended, incision well-approximated without erythema or drainage, dressing c/d/i Musculoskeletal: UE and LE FROM, no edema or wounds, motor and sensation grossly intact, NT   Labs:  CBC:  Lab Results  Component Value Date   WBC 9.8 07/02/2016   RBC 3.79 (L) 07/02/2016   BMP:  Lab Results  Component Value Date   GLUCOSE 100 (H) 07/02/2016   CO2 28 07/02/2016   BUN 5  (L) 07/02/2016   CREATININE 0.57 07/02/2016   CALCIUM 7.9 (L) 07/02/2016     Imaging studies: No new pertinent imaging studies    Assessment/Plan:(ICD-10's: K56.69, K56.7) 69 y.o.femalewithpost-operative ileus, otherwise doing well 5DaysPost-Ops/p 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  - continue regular diet - pain control prn, minimize narcotics   - anticoagulation for likely subacute vs chronic PE as per medicine - ambulation and smoking cessation encouraged - follow-up pending surgical pathology              - discharge planning  All of the above findings and recommendations were discussed with the patient and the medical team, and all of patient's questions were answered to her expressed satisfaction.  -- Marilynne Drivers Rosana Hoes, MD, [place]: [place] [facility] Office: [phone]

## 2016-07-02 NOTE — Progress Notes (Signed)
PROGRESS NOTE    Janet Hanson  [id number] DOB: 19-Aug-1946 DOA: 06/24/2016 PCP: No PCP Per Patient   Brief Narrative:   Patient was admitted with colonic mass, s/p hemicolectomy, with path pending, suspicious for malignancy, developed acute PE and has been on IV Heparin.   Assessment & Plan:   Active Problems:   Colonic mass   Anxiety   HTN (hypertension)   Acute pulmonary embolism (HCC)  Colonic Mass:  Diet advanced nicely.  Can be discharged home per surgery.  Path is still pending.   Likely will d/c home tomorrow.  Will need to follow up with Oncology outpatient as well.   PE:  Will transition from IV Heparin to Xarelto.  Start 15mg  BID for 21 days.  HTN:  Doing well.  Anxiety:  Stable.    DVT prophylaxis:  FULL Anticoagulation. Code Status: FULL CODE.  Family Communication: Daughter is aware.  Consultants:   GI  Surgery.   Procedures:   Hemicolectomy  Arteriogram.   Antimicrobials: None.   Subjective: Feeling better.  Wanting to go home.   Objective: Vitals:   07/01/16 1045 07/01/16 1440 07/01/16 2032 07/02/16 0634  BP: 134/87 132/76 (!) 144/70 140/89  Pulse: 85 90 92 88  Resp:  18 18 18   Temp:  97.6 F (36.4 C) 98.8 F (37.1 C) 98.5 F (36.9 C)  TempSrc:  Oral Oral Oral  SpO2:  100% 96% 96%  Weight:      Height:        Intake/Output Summary (Last 24 hours) at 07/02/16 1107 Last data filed at 07/02/16 1000  Gross per 24 hour  Intake          2407.14 ml  Output              800 ml  Net          1607.14 ml   Filed Weights   06/24/16 1533 06/25/16 0004 06/27/16 1101  Weight: 70.3 kg (155 lb) 71.6 kg (157 lb 14.4 oz) 71.2 kg (157 lb)    Examination:  General exam: Appears calm and comfortable  Respiratory system: Clear to auscultation. Respiratory effort normal. Cardiovascular system: S1 & S2 heard, RRR. No JVD, murmurs, rubs, gallops or clicks. No pedal edema. Gastrointestinal system: Abdomen is nondistended, soft and nontender.  No organomegaly or masses felt. Normal bowel sounds heard. Central nervous system: Alert and oriented. No focal neurological deficits. Extremities: Symmetric 5 x 5 power. Skin: No rashes, lesions or ulcers Psychiatry: Judgement and insight appear normal. Mood & affect appropriate.     Data Reviewed:   CBC:  Recent Labs Lab 06/28/16 0634 06/29/16 0635 06/30/16 0605 07/01/16 0617 07/02/16 0558  WBC 8.2 9.8 7.2 6.3 9.8  HGB 11.1* 11.1* 10.3* 10.4* 9.8*  HCT 35.6* 36.0 32.8* 33.3* 31.5*  MCV 84.6 84.1 83.5 83.3 83.1  PLT 388 399 405* 408* [id number]*   Basic Metabolic Panel:  Recent Labs Lab 06/28/16 0634 06/29/16 0635 06/30/16 0605 07/01/16 0617 07/02/16 0558  NA 135 134* 134* 134* 135  K 3.4* 4.6 3.7 3.6 3.7  CL 102 103 102 100* 101  CO2 27 29 27 28 28   GLUCOSE 129* 97 113* 109* 100*  BUN 12 7 5* <5* 5*  CREATININE 0.55 0.49 0.43* 0.48 0.57  CALCIUM 6.7* 7.5* 7.5* 7.8* 7.9*   GFR: Estimated Creatinine Clearance: 63.6 mL/min (by C-G formula based on SCr of 0.8 mg/dL).   Recent Results (from the past 240 hour(s))  Surgical PCR screen  Status: None   Collection Time: 06/25/16  3:00 PM  Result Value Ref Range Status   MRSA, PCR NEGATIVE NEGATIVE Final   Staphylococcus aureus NEGATIVE NEGATIVE Final    Comment:        The Xpert SA Assay (FDA approved for NASAL specimens in patients over 64 years of age), is one component of a comprehensive surveillance program.  Test performance has been validated by [facility] for patients greater than or equal to 82 year old. It is not intended to diagnose infection nor to guide or monitor treatment.   Surgical pcr screen     Status: Abnormal   Collection Time: 06/27/16  4:30 AM  Result Value Ref Range Status   MRSA, PCR INVALID RESULTS, SPECIMEN SENT FOR CULTURE (A) NEGATIVE Final    Comment: RESULT CALLED TO, READ BACK BY AND VERIFIED WITH: DAVIS,L. AT 1446 ON 06/27/2016 BY AGUNDIZ,E.    Staphylococcus aureus INVALID  RESULTS, SPECIMEN SENT FOR CULTURE (A) NEGATIVE Final    Comment:        The Xpert SA Assay (FDA approved for NASAL specimens in patients over 43 years of age), is one component of a comprehensive surveillance program.  Test performance has been validated by [facility] for patients greater than or equal to 50 year old. It is not intended to diagnose infection nor to guide or monitor treatment.   MRSA culture     Status: None   Collection Time: 06/27/16  4:30 AM  Result Value Ref Range Status   Specimen Description NOSE  Final   Special Requests NONE  Final   Culture NEGATIVE Performed at [facility]   Final   Report Status 06/29/2016 FINAL  Final         Radiology Studies: Ct Angio Chest Pe W Or Wo Contrast  Result Date: 06/30/2016 CLINICAL DATA:  Possible pulmonary embolus suggested on previous routine CT of the chest done earlier today. EXAM: CT ANGIOGRAPHY CHEST WITH CONTRAST TECHNIQUE: Multidetector CT imaging of the chest was performed using the standard protocol during bolus administration of intravenous contrast. Multiplanar CT image reconstructions and MIPs were obtained to evaluate the vascular anatomy. CONTRAST:  100 mL Isovue 370 COMPARISON:  06/30/2016 FINDINGS: Technically adequate study with good opacification of the central and segmental pulmonary arteries. Filling defects are demonstrated central right upper lobe and right lower lobe branches consistent with nonocclusive acute pulmonary embolus. This corresponds to abnormality seen on previous study. RV to LV ratio is normal suggesting no evidence of right heart strain. Normal heart size. Normal caliber thoracic aorta. Coronary artery calcifications. No aortic dissection. Great vessel origins are patent. Esophagus is decompressed. No significant lymphadenopathy in the chest. Mediastinal lymph nodes are not pathologically enlarged. Small bilateral pleural effusions with basilar atelectasis, greater on the  right. This is unchanged since prior study. Evaluation of lungs is limited due to motion artifact. Patchy ground-glass areas and nodule seen on previous study are not well depicted on the current study. No pneumothorax. Included portions of the upper abdominal organs demonstrate minimal upper abdominal ascites. Degenerative changes in spine. No destructive bone lesions. Review of the MIP images confirms the above findings. IMPRESSION: Positive examination for right upper lobe and right lower lobe pulmonary emboli. No evidence of right heart strain. Again demonstrated are bilateral pleural effusions and basilar atelectasis, greater on the right. These results were called by telephone at the time of interpretation on 06/30/2016 at 6:42 pm to [facility], the patient's nurse on Dept 300,  who verbally acknowledged these results. Electronically Signed   By: Lucienne Capers M.D.   On: 06/30/2016 18:49        Scheduled Meds: . chlorhexidine  15 mL Mouth Rinse BID  . mouth rinse  15 mL Mouth Rinse q12n4p  . metoprolol  5 mg Intravenous Q12H  . Rivaroxaban  15 mg Oral BID WC     LOS: 8 days  Time spent: 25  Rishaan Gunner, MD FACP. [facility]  If 7PM-7AM, please contact night-coverage [email or web address] Password [email or web address] 07/02/2016, 11:07 AM

## 2016-07-02 NOTE — Progress Notes (Signed)
ANTICOAGULATION CONSULT NOTE - follow up  Pharmacy Consult for HEPARIN Indication: pulmonary embolus  No Known Allergies  Patient Measurements: Height: 5\' 3"  (160 cm) Weight: 157 lb (71.2 kg) IBW/kg (Calculated) : 52.4 HEPARIN DW (KG): 67.2  Vital Signs: Temp: 98.5 F (36.9 C) (08/30 0634) Temp Source: Oral (08/30 0634) BP: 140/89 (08/30 0634) Pulse Rate: 88 (08/30 0634)  Labs:  Recent Labs  06/30/16 0605 07/01/16 0617 07/01/16 2054 07/02/16 0558 07/02/16 0601  HGB 10.3* 10.4*  --  9.8*  --   HCT 32.8* 33.3*  --  31.5*  --   PLT 405* 408*  --  406*  --   HEPARINUNFRC  --   --  <0.10*  --  0.32  CREATININE 0.43* 0.48  --  0.57  --    Estimated Creatinine Clearance: 63.6 mL/min (by C-G formula based on SCr of 0.8 mg/dL).  Medical History: Past Medical History:  Diagnosis Date  . Hypercholesterolemia   . Hypertension    Medications:  Prescriptions Prior to Admission  Medication Sig Dispense Refill Last Dose  . amLODipine (NORVASC) 5 MG tablet Take 5 mg by mouth daily.   06/24/2016 at Unknown time  . escitalopram (LEXAPRO) 10 MG tablet Take 10 mg by mouth daily.   06/24/2016 at Unknown time  . Omega-3 Fatty Acids (FISH OIL) 1200 MG CAPS Take 1 capsule by mouth daily.   06/24/2016  . simvastatin (ZOCOR) 40 MG tablet Take 40 mg by mouth daily.   06/24/2016 at Unknown time  . ciprofloxacin (CIPRO) 500 MG tablet Take 500 mg by mouth 2 (two) times daily.   Completed Course at Unknown time  . metroNIDAZOLE (FLAGYL) 500 MG tablet Take 500 mg by mouth 2 (two) times daily.   Completed Course at Unknown time   Assessment: 70yo female with n/v, abdominal pain.  Found to have obstructing colonic mass of ascending colon.  Reportedly had hemicolectomy on 8/25.  Now asked to start Heparin for suspected PE.  Heparin level now at goal after rate increase.    Goal of Therapy:  Heparin level 0.3-0.7 units/ml Monitor platelets by anticoagulation protocol: Yes   Plan:   Continue  Heparin infusion at 1300 units/hr  Recheck confirmatory level today at 12 noon  Heparin level daily  CBC daily while on Heparin  F/U for transition to oral AC   Ryott Rafferty A 07/02/2016,8:08 AM

## 2016-07-03 ENCOUNTER — Other Ambulatory Visit (HOSPITAL_COMMUNITY): Payer: Self-pay | Admitting: Oncology

## 2016-07-03 DIAGNOSIS — I10 Essential (primary) hypertension: Secondary | ICD-10-CM

## 2016-07-03 LAB — CBC
HCT: 30.1 % — ABNORMAL LOW (ref 36.0–46.0)
Hemoglobin: 9.7 g/dL — ABNORMAL LOW (ref 12.0–15.0)
MCH: 26.6 pg (ref 26.0–34.0)
MCHC: 32.2 g/dL (ref 30.0–36.0)
MCV: 82.5 fL (ref 78.0–100.0)
PLATELETS: 375 10*3/uL (ref 150–400)
RBC: 3.65 MIL/uL — AB (ref 3.87–5.11)
RDW: 16 % — ABNORMAL HIGH (ref 11.5–15.5)
WBC: 8.5 10*3/uL (ref 4.0–10.5)

## 2016-07-03 MED ORDER — RIVAROXABAN (XARELTO) VTE STARTER PACK (15 & 20 MG): ORAL_TABLET | ORAL | 0 refills | Status: DC

## 2016-07-03 MED ORDER — RIVAROXABAN 15 MG PO TABS: 15.0000 mg | ORAL_TABLET | Freq: Two times a day (BID) | ORAL | 0 refills | Status: DC

## 2016-07-03 NOTE — Progress Notes (Signed)
Discharged PT per MD order and protocol. Discharge handouts reviewed/explained. Education completed.  Pt verbalized understanding and left with all belongings. VSS. IV catheter D/C.   Prescriptions given and explained. Patient wheeled down by staff member. Oswald Hillock, RN

## 2016-07-03 NOTE — Progress Notes (Signed)
[place] [facility] Day(s): 9.   Post op day(s): 6 Days Post-Op.   Interval History: Patient seen and examined, no acute events or new complaints overnight. Patient reports she's been passing flatus + BM, tolerating regular diet, and continuing to ambulate with minimal abdominal pain well-controlled without nausea, fever/chills, CP, or SOB.  Review of Systems:  Constitutional: denies fever, chills  HEENT: denies cough or congestion  Respiratory: denies any shortness of breath  Cardiovascular: denies chest pain or palpitations  Gastrointestinal: denies N/V, diarrhea or constipation  Musculoskeletal: denies pain, decreased motor or sensation  Neurological: denies HA or vision/hearing changes   Vital signs in last 24 hours: [min-max] current  Temp:  [98 F (36.7 C)-98.4 F (36.9 C)] 98 F (36.7 C) (08/31 0616) Pulse Rate:  [76-87] 82 (08/31 0616) Resp:  [18-20] 20 (08/31 0616) BP: (122-134)/(65-73) 134/73 (08/31 0616) SpO2:  [93 %-97 %] 93 % (08/31 0616)     Height: 5\' 3"  (160 cm) Weight: 71.2 kg (157 lb) BMI (Calculated): 27.9   Intake/Output this shift:  No intake/output data recorded.   Intake/Output last 2 shifts:  @IOLAST2SHIFTS @   Physical Exam:  Constitutional: alert, cooperative and no distress  HENT: normocephalic without obvious abnormality  Eyes: PERRL, EOM's grossly intact and symmetric  Neuro: CN II - XII grossly intact and symmetric without deficit  Respiratory: breathing non-labored at rest  Cardiovascular: regular rate and sinus rhythm  Gastrointestinal: soft, non-tender, and non-distended with incision healing well, well-approximated without erythema or drainage and dressing c/d/i Musculoskeletal: UE and LE FROM, no edema or wounds, motor and sensation grossly intact, NT   Labs:  CBC:  Lab Results  Component Value Date   WBC 8.5 07/03/2016   RBC 3.65 (L) 07/03/2016   BMP:  Lab Results  Component Value Date   GLUCOSE 100 (H)  07/02/2016   CO2 28 07/02/2016   BUN 5 (L) 07/02/2016   CREATININE 0.57 07/02/2016   CALCIUM 7.9 (L) 07/02/2016   Coagulation: No results found for: INR, APTT   Imaging studies: No new pertinent imaging studies    Assessment/Plan:(ICD-10's: K56.69, K56.7) Janet Hanson, complicated by pertinent comorbidities including tobacco abuse, HTN, HLD, asymptomatic 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  - continue regular diet - pain control prn, minimize narcotics              - anticoagulation for likely subacute vs chronic PE as per medicine - ambulation and smoking cessation encouraged - follow-up pending surgical pathology - discharge planning  All of the above findings and recommendations were discussed with the patient and the medical team, and all of patient's questions were answered to her expressed satisfaction.  -- Marilynne Drivers Rosana Hoes, MD, [place]: [place] [facility] Office: [phone]

## 2016-07-03 NOTE — Care Management Note (Signed)
Case Management Note  Patient Details  Name: NAEL HABTE MRN: [id number] Date of Birth: 01/10/1946  Expected Discharge Date:  07/01/16               Expected Discharge Plan:  Home/Self Care  In-House Referral:  NA  Discharge planning Services  CM Consult  Post Acute Care Choice:  NA Choice offered to:  NA  DME Arranged:    DME Agency:     HH Arranged:    [place] Agency:     Status of Service:  Completed, signed off  If discussed at [employer] of Stay Meetings, dates discussed:    Additional Comments: Pt is discharging home today with self care. Per MD pt does not have a PCP. She tells CM that she does, a Dr. Gara Kroner, in [place], [place]. Pt's RN will make f/u appointment prior to DC. Pt will also f/u with oncology and surgery.   Sherald Barge, RN 07/03/2016, 11:10 AM

## 2016-07-03 NOTE — Discharge Summary (Signed)
Physician Discharge Summary  Janet Hanson [id number] DOB: Aug 10, 1946 DOA: 06/24/2016  PCP: No PCP Per Patient  Admit date: 06/24/2016 Discharge date: 07/03/2016  Admitted From: Home Disposition:  Home  Recommendations for Outpatient Follow-up:  1. Follow up with PCP in 1-2 weeks.  New PCP to be established.  2. Please obtain BMP/CBC in one week 3. Needs to see Dr Whitney Muse for newly diagnosed colon CA.  4. Follow up with Dr Rosana Hoes in 1-2 weeks.   Home Health: None needed.  Equipment/Devices None. Discharge Condition: able to eat.  CODE STATUS: FULL CODE.  Diet recommendation: Heart healthy diet.   Brief/Interim Summary:  Patient was admitted by me on Jun 24, 2016 for SBO and a colonic mass.   As per my prior H and P:  "  Janet Hanson is a 70 y.o. female who presents to the emergency department with complaints of worsening abdominal pain. She was told that she had an infection about 2 weeks ago, and was given Flagyl and Cipro. She feels as though the antibiotics helped her diarrhea and vomiting, though the pain has persisted. She reports having only had a single bowel movement in the past week. She denies any other new onset symptoms.  ED Course: while in the ED, CBC, CMP, were largely unremarkable. CT scan showed showed a large annular constricting mass involving mid aspect of ascending colon most compatible with colonic carcinoma. Fortunately, no metastasis to the liver or surrounding lymph nodes was noted. Dilatation of the cecum and small bowel with fluid and gas, compatable with proximal obstruction was also noted. General surgery consult was called. Patient will be referred for GI consult and further evaluation.   Patient was admitted and made NPO.  She was given IVF.  GI was consulted for colonoscopy with biopsy, however, due to her obstruction, it was not possible.  Surgery was consulted, and she was seen by Dr Rosana Hoes, and he performed left hemicolectomy.  Fortunately for  her, the pathology showed clean margins, and no lymphatic spread was found.  She is a T3NOMx, adenocarcinoma. She did have muscular layer invasion, however.  She incidentally had a small PE, hemodynamically insignificant, and very little if any symptoms were present.  She underwent angiogram and it was confirmed.  Due to her recent surgery, decision was to start her on IV heparin, and she had no bleeding.  It was converted to Xarelto, and she was given the starter package.   She was able to tolerate her diet, and has been anxious to go home.   She will need to follow up with Dr Whitney Muse for further recommendation, along with seeing Dr Rosana Hoes for follow up in 1-2 weeks.  She is encouraged to get established with a PCP for general medical care as well.  She should avoid NSAIDS as she is on blood thinner at this time.    Discharge Diagnoses:  Active Problems:   Colonic mass   Anxiety   HTN (hypertension)   Acute pulmonary embolism [facility])    Discharge Instructions  Discharge Instructions    Diet - low sodium heart healthy    Complete by:  As directed   Discharge instructions    Complete by:  As directed   Take your medicine as directed.  You will need to change the dose of Xarelto after 3 weeks, and stay on it for at least 3 months.  Please see Dr Whitney Muse in follow up.  Avoid NSAIDS medication, as you are on blood  thinner, and can bleed.  See you PCP in one week.  See Dr Rosana Hoes as he operated on you, and likely will need to do surveillant colonoscopy in the future.   Increase activity slowly    Complete by:  As directed       Medication List    STOP taking these medications   ciprofloxacin 500 MG tablet Commonly known as:  CIPRO   metroNIDAZOLE 500 MG tablet Commonly known as:  FLAGYL     TAKE these medications   amLODipine 5 MG tablet Commonly known as:  NORVASC Take 5 mg by mouth daily.   escitalopram 10 MG tablet Commonly known as:  LEXAPRO Take 10 mg by mouth daily.   Fish Oil  1200 MG Caps Take 1 capsule by mouth daily.   Rivaroxaban 15 & 20 MG Tbpk Take as directed on package: Start with one 15mg  tablet by mouth twice a day with food. On Day 22, switch to one 20mg  tablet once a day with food.   simvastatin 40 MG tablet Commonly known as:  ZOCOR Take 40 mg by mouth daily.      Follow-up Information    Vickie Epley, MD. Schedule an appointment as soon as possible for a visit in 2 day(s).   Specialty:  General Surgery Contact information: [place] [facility] [address] [phone]          No Known Allergies  Consultations:  GI, Surgery.    Procedures/Studies: Chest 2 View  Result Date: 06/25/2016 CLINICAL DATA:  Preoperative evaluation for colonic mass. EXAM: CHEST  2 VIEW COMPARISON:  CT abdomen and pelvis June 24, 2016 FINDINGS: Cardiomediastinal silhouette is normal. Mildly tortuous aorta with chronic hypertension. Mildly calcified aortic knob. Bibasilar strandy densities without pleural effusion or focal consolidation. Mild bronchitic changes. No pneumothorax. Nasogastric tube side port projects in proximal stomach, distal tip not imaged. Scattered small bowel air-fluid levels. IMPRESSION: Mild bronchitic changes and bibasilar atelectasis. Nasogastric tube side port projecting in proximal stomach. Electronically Signed   By: Elon Alas M.D.   On: 06/25/2016 15:10   Ct Chest W Contrast  Result Date: 06/30/2016 CLINICAL DATA:  Malignant colon mass. EXAM: CT CHEST WITH CONTRAST TECHNIQUE: Multidetector CT imaging of the chest was performed during intravenous contrast administration. CONTRAST:  62mL ISOVUE-300 IOPAMIDOL (ISOVUE-300) INJECTION 61% COMPARISON:  None. FINDINGS: Cardiovascular: Mild cardiac enlargement. There is no pericardial effusion. Aortic atherosclerosis noted. Calcification involving the LAD and left circumflex coronary arteries noted. There is a suggestion of a filling defect within the right lower lobe  pulmonary artery, image number 86 of series 5. Mediastinum/Nodes: The trachea appears patent and is midline. Normal appearance of the esophagus. No enlarged mediastinal or hilar lymph nodes. No axillary or supraclavicular adenopathy. Lungs/Pleura: Bilateral pleural effusions identified right greater in left. Overlying compressive type atelectasis and consolidation noted. A few patchy areas of ground-glass attenuation noted within the upper lobes. Nonspecific pulmonary nodule in left upper lobe measures 3 mm, image 30 of series 4. Upper Abdomen: Pneumoperitoneum is noted, likely secondary to recent surgery. A small amount of fluid identified around the liver. The visualized portions of the liver, spleen, adrenal glands. 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 IMPRESSION: 1. No specific findings identified to suggest metastatic disease to the chest. 2. There is a 3 mm nodule identified within the left upper lobe, nonspecific. 3. Bilateral pleural effusions with overlying compressive type atelectasis and consolidation. 4. Suggestion of filling defect within right  lower lobe pulmonary artery. Note examination is not optimized to assess the pulmonary arteries for pulmonary embolus. Consider further evaluation with CT of the chest. These results were called by telephone at the time of interpretation on 06/30/2016 at 10:25 am to Dr. Robynn Pane , who verbally acknowledged these results. Electronically Signed   By: Kerby Moors M.D.   On: 06/30/2016 10:25   Ct Angio Chest Pe W Or Wo Contrast  Result Date: 06/30/2016 CLINICAL DATA:  Possible pulmonary embolus suggested on previous routine CT of the chest done earlier today. EXAM: CT ANGIOGRAPHY CHEST WITH CONTRAST TECHNIQUE: Multidetector CT imaging of the chest was performed using the standard protocol during bolus administration of intravenous contrast. Multiplanar CT image reconstructions and MIPs were obtained to evaluate the  vascular anatomy. CONTRAST:  100 mL Isovue 370 COMPARISON:  06/30/2016 FINDINGS: Technically adequate study with good opacification of the central and segmental pulmonary arteries. Filling defects are demonstrated central right upper lobe and right lower lobe branches consistent with nonocclusive acute pulmonary embolus. This corresponds to abnormality seen on previous study. RV to LV ratio is normal suggesting no evidence of right heart strain. Normal heart size. Normal caliber thoracic aorta. Coronary artery calcifications. No aortic dissection. Great vessel origins are patent. Esophagus is decompressed. No significant lymphadenopathy in the chest. Mediastinal lymph nodes are not pathologically enlarged. Small bilateral pleural effusions with basilar atelectasis, greater on the right. This is unchanged since prior study. Evaluation of lungs is limited due to motion artifact. Patchy ground-glass areas and nodule seen on previous study are not well depicted on the current study. No pneumothorax. Included portions of the upper abdominal organs demonstrate minimal upper abdominal ascites. Degenerative changes in spine. No destructive bone lesions. Review of the MIP images confirms the above findings. IMPRESSION: Positive examination for right upper lobe and right lower lobe pulmonary emboli. No evidence of right heart strain. Again demonstrated are bilateral pleural effusions and basilar atelectasis, greater on the right. These results were called by telephone at the time of interpretation on 06/30/2016 at 6:42 pm to [facility], the patient's nurse on Dept 300, who verbally acknowledged these results. Electronically Signed   By: Lucienne Capers M.D.   On: 06/30/2016 18:49   Ct Abdomen Pelvis W Contrast  Result Date: 06/24/2016 CLINICAL DATA:  Patient with abdominal pain and cramping for 3 weeks. EXAM: CT ABDOMEN AND PELVIS WITH CONTRAST TECHNIQUE: Multidetector CT imaging of the abdomen and pelvis was performed using  the standard protocol following bolus administration of intravenous contrast. CONTRAST:  170mL ISOVUE-300 IOPAMIDOL (ISOVUE-300) INJECTION 61% COMPARISON:  None. FINDINGS: Lower chest: Normal heart size. Scarring and or atelectasis within the lower lobes bilaterally. No pleural effusion. Small hiatal hernia. Hepatobiliary: Liver is normal in size and contour. No focal hepatic lesions identified. Gallbladder is unremarkable. Pancreas: Unremarkable Spleen: Unremarkable Adrenals/Urinary Tract: The adrenal glands are normal. Kidneys enhance symmetrically with contrast. No hydronephrosis. Urinary bladder is unremarkable. Stomach/Bowel: Sigmoid colonic diverticulosis. 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 There is a 5.1 x 3.9 cm annular constricting mass involving the mid aspect of the ascending colon (image 66; series 5). There is an adjacent small foci of gas and small amount fluid within the colonic mesentery (image 52; series 7). There is dilatation of the cecum and upstream small bowel which is fluid and gas filled measuring up to 4 cm. Vascular/Lymphatic: Normal caliber abdominal aorta. Peripheral calcified atherosclerotic plaque. No retroperitoneal  lymphadenopathy. Other: Uterus and adnexal structures are unremarkable. Musculoskeletal: Lumbar spine degenerative changes. No aggressive or acute appearing osseous lesions. IMPRESSION: There is a large annular constricting mass involving the mid aspect of the ascending colon most compatible with colonic carcinoma. There is a small adjacent foci of gas and free fluid raising the possibility of micro perforation. There is marked dilatation of the cecum and small bowel with fluid and gas most compatible with proximal obstruction caused by the large ascending colonic mass. Additionally there is a 6 cm fatty mass involving the colon at the level the hepatic flexure, most suggestive of a lipoma. Aortic vascular  calcifications. Sigmoid colonic diverticulosis without evidence for acute diverticulitis. Electronically Signed   By: Lovey Newcomer M.D.   On: 06/24/2016 21:04    Discharge Exam: Vitals:   07/02/16 2219 07/03/16 0616  BP: 122/65 134/73  Pulse: 76 82  Resp: 20 20  Temp: 98.1 F (36.7 C) 98 F (36.7 C)   Vitals:   07/02/16 0634 07/02/16 1346 07/02/16 2219 07/03/16 0616  BP: 140/89 128/69 122/65 134/73  Pulse: 88 87 76 82  Resp: 18 18 20 20   Temp: 98.5 F (36.9 C) 98.4 F (36.9 C) 98.1 F (36.7 C) 98 F (36.7 C)  TempSrc: Oral  Oral Oral  SpO2: 96% 97% 96% 93%  Weight:      Height:        General: Pt is alert, awake, not in acute distress Cardiovascular: RRR, S1/S2 +, no rubs, no gallops Respiratory: CTA bilaterally, no wheezing, no rhonchi Abdominal: Soft, NT, ND, bowel sounds + Extremities: no edema, no cyanosis    The results of significant diagnostics from this hospitalization (including imaging, microbiology, ancillary and laboratory) are listed below for reference.     Microbiology: Recent Results (from the past 240 hour(s))  Surgical PCR screen     Status: None   Collection Time: 06/25/16  3:00 PM  Result Value Ref Range Status   MRSA, PCR NEGATIVE NEGATIVE Final   Staphylococcus aureus NEGATIVE NEGATIVE Final    Comment:        The Xpert SA Assay (FDA approved for NASAL specimens in patients over 35 years of age), is one component of a comprehensive surveillance program.  Test performance has been validated by [facility] for patients greater than or equal to 65 year old. It is not intended to diagnose infection nor to guide or monitor treatment.   Surgical pcr screen     Status: Abnormal   Collection Time: 06/27/16  4:30 AM  Result Value Ref Range Status   MRSA, PCR INVALID RESULTS, SPECIMEN SENT FOR CULTURE (A) NEGATIVE Final    Comment: RESULT CALLED TO, READ BACK BY AND VERIFIED WITH: DAVIS,L. AT 1446 ON 06/27/2016 BY AGUNDIZ,E.     Staphylococcus aureus INVALID RESULTS, SPECIMEN SENT FOR CULTURE (A) NEGATIVE Final    Comment:        The Xpert SA Assay (FDA approved for NASAL specimens in patients over 16 years of age), is one component of a comprehensive surveillance program.  Test performance has been validated by [facility] for patients greater than or equal to 57 year old. It is not intended to diagnose infection nor to guide or monitor treatment.   MRSA culture     Status: None   Collection Time: 06/27/16  4:30 AM  Result Value Ref Range Status   Specimen Description NOSE  Final   Special Requests NONE  Final   Culture NEGATIVE Performed  at [facility]   Final   Report Status 06/29/2016 FINAL  Final     Basic Metabolic Panel:  Recent Labs Lab 06/28/16 [id number] 06/29/16 0635 06/30/16 0605 07/01/16 0617 07/02/16 0558  NA 135 134* 134* 134* 135  K 3.4* 4.6 3.7 3.6 3.7  CL 102 103 102 100* 101  CO2 27 29 27 28 28   GLUCOSE 129* 97 113* 109* 100*  BUN 12 7 5* <5* 5*  CREATININE 0.55 0.49 0.43* 0.48 0.57  CALCIUM 6.7* 7.5* 7.5* 7.8* 7.9*   Liver Function Tests: CBC:  Recent Labs Lab 06/29/16 0635 06/30/16 0605 07/01/16 0617 07/02/16 0558 07/03/16 0626  WBC 9.8 7.2 6.3 9.8 8.5  HGB 11.1* 10.3* 10.4* 9.8* 9.7*  HCT 36.0 32.8* 33.3* 31.5* 30.1*  MCV 84.1 83.5 83.3 83.1 82.5  PLT 399 405* 408* 406* 375    Anemia work up No results for input(s): VITAMINB12, FOLATE, FERRITIN, TIBC, IRON, RETICCTPCT in the last 72 hours. Urinalysis    Component Value Date/Time   COLORURINE AMBER (A) 06/24/2016 2226   APPEARANCEUR CLEAR 06/24/2016 2226   LABSPEC 1.020 06/24/2016 2226   PHURINE 5.0 06/24/2016 2226   GLUCOSEU NEGATIVE 06/24/2016 2226   HGBUR NEGATIVE 06/24/2016 2226   BILIRUBINUR SMALL (A) 06/24/2016 2226   KETONESUR 15 (A) 06/24/2016 2226   PROTEINUR 30 (A) 06/24/2016 2226   NITRITE NEGATIVE 06/24/2016 2226   LEUKOCYTESUR NEGATIVE 06/24/2016 2226   Sepsis Labs Invalid  input(s): PROCALCITONIN,  WBC,  LACTICIDVEN Microbiology Recent Results (from the past 240 hour(s))  Surgical PCR screen     Status: None   Collection Time: 06/25/16  3:00 PM  Result Value Ref Range Status   MRSA, PCR NEGATIVE NEGATIVE Final   Staphylococcus aureus NEGATIVE NEGATIVE Final    Comment:        The Xpert SA Assay (FDA approved for NASAL specimens in patients over 59 years of age), is one component of a comprehensive surveillance program.  Test performance has been validated by [facility] for patients greater than or equal to 31 year old. It is not intended to diagnose infection nor to guide or monitor treatment.   Surgical pcr screen     Status: Abnormal   Collection Time: 06/27/16  4:30 AM  Result Value Ref Range Status   MRSA, PCR INVALID RESULTS, SPECIMEN SENT FOR CULTURE (A) NEGATIVE Final    Comment: RESULT CALLED TO, READ BACK BY AND VERIFIED WITH: DAVIS,L. AT 1446 ON 06/27/2016 BY AGUNDIZ,E.    Staphylococcus aureus INVALID RESULTS, SPECIMEN SENT FOR CULTURE (A) NEGATIVE Final    Comment:        The Xpert SA Assay (FDA approved for NASAL specimens in patients over 77 years of age), is one component of a comprehensive surveillance program.  Test performance has been validated by [facility] for patients greater than or equal to 37 year old. It is not intended to diagnose infection nor to guide or monitor treatment.   MRSA culture     Status: None   Collection Time: 06/27/16  4:30 AM  Result Value Ref Range Status   Specimen Description NOSE  Final   Special Requests NONE  Final   Culture NEGATIVE Performed at [facility]   Final   Report Status 06/29/2016 FINAL  Final     Time coordinating discharge: Over 30 minutes  SIGNED:  Orvan Falconer, MD FACP [facility] 07/03/2016, 9:14 AM   If 7PM-7AM, please contact night-coverage [email or web address] Password [email or web address]

## 2016-07-30 ENCOUNTER — Encounter (HOSPITAL_COMMUNITY): Payer: Self-pay | Admitting: Hematology & Oncology

## 2016-07-30 ENCOUNTER — Encounter (HOSPITAL_COMMUNITY): Payer: Medicare Other | Attending: Hematology & Oncology | Admitting: Hematology & Oncology

## 2016-07-30 VITALS — BP 129/76 | HR 101 | Temp 98.7°F | Resp 18 | Ht 61.0 in | Wt 155.9 lb

## 2016-07-30 DIAGNOSIS — Z79899 Other long term (current) drug therapy: Secondary | ICD-10-CM | POA: Insufficient documentation

## 2016-07-30 DIAGNOSIS — Z7901 Long term (current) use of anticoagulants: Secondary | ICD-10-CM

## 2016-07-30 DIAGNOSIS — C182 Malignant neoplasm of ascending colon: Secondary | ICD-10-CM

## 2016-07-30 DIAGNOSIS — D500 Iron deficiency anemia secondary to blood loss (chronic): Secondary | ICD-10-CM

## 2016-07-30 DIAGNOSIS — Z87891 Personal history of nicotine dependence: Secondary | ICD-10-CM | POA: Diagnosis not present

## 2016-07-30 DIAGNOSIS — I2699 Other pulmonary embolism without acute cor pulmonale: Secondary | ICD-10-CM | POA: Diagnosis not present

## 2016-07-30 DIAGNOSIS — R970 Elevated carcinoembryonic antigen [CEA]: Secondary | ICD-10-CM

## 2016-07-30 HISTORY — DX: Iron deficiency anemia secondary to blood loss (chronic): D50.0

## 2016-07-30 HISTORY — DX: Malignant neoplasm of ascending colon: C18.2

## 2016-07-30 LAB — CBC WITH DIFFERENTIAL/PLATELET
Basophils Absolute: 0 10*3/uL (ref 0.0–0.1)
Basophils Relative: 0 %
Eosinophils Absolute: 0.2 10*3/uL (ref 0.0–0.7)
Eosinophils Relative: 2 %
HCT: 34.5 % — ABNORMAL LOW (ref 36.0–46.0)
Hemoglobin: 10.6 g/dL — ABNORMAL LOW (ref 12.0–15.0)
Lymphocytes Relative: 34 %
Lymphs Abs: 3 10*3/uL (ref 0.7–4.0)
MCH: 26.4 pg (ref 26.0–34.0)
MCHC: 30.7 g/dL (ref 30.0–36.0)
MCV: 85.8 fL (ref 78.0–100.0)
Monocytes Absolute: 0.7 10*3/uL (ref 0.1–1.0)
Monocytes Relative: 8 %
Neutro Abs: 5.1 10*3/uL (ref 1.7–7.7)
Neutrophils Relative %: 56 %
Platelets: 367 10*3/uL (ref 150–400)
RBC: 4.02 MIL/uL (ref 3.87–5.11)
RDW: 17.1 % — ABNORMAL HIGH (ref 11.5–15.5)
WBC: 9 10*3/uL (ref 4.0–10.5)

## 2016-07-30 LAB — COMPREHENSIVE METABOLIC PANEL
ALBUMIN: 3.9 g/dL (ref 3.5–5.0)
ALK PHOS: 55 U/L (ref 38–126)
ALT: 21 U/L (ref 14–54)
AST: 23 U/L (ref 15–41)
Anion gap: 8 (ref 5–15)
BUN: 15 mg/dL (ref 6–20)
CALCIUM: 8.9 mg/dL (ref 8.9–10.3)
CHLORIDE: 101 mmol/L (ref 101–111)
CO2: 26 mmol/L (ref 22–32)
CREATININE: 0.91 mg/dL (ref 0.44–1.00)
GFR calc non Af Amer: >60 mL/min (ref >60)
GLUCOSE: 119 mg/dL — AB (ref 65–99)
Potassium: 3.9 mmol/L (ref 3.5–5.1)
SODIUM: 135 mmol/L (ref 135–145)
Total Bilirubin: 0.6 mg/dL (ref 0.3–1.2)
Total Protein: 7.7 g/dL (ref 6.5–8.1)

## 2016-07-30 LAB — FOLATE: Folate: 15.7 ng/mL (ref >5.9)

## 2016-07-30 LAB — FERRITIN: Ferritin: 9 ng/mL — ABNORMAL LOW (ref 11–307)

## 2016-07-30 LAB — VITAMIN B12: VITAMIN B 12: 187 pg/mL (ref 180–914)

## 2016-07-30 NOTE — Progress Notes (Signed)
[facility] NOTE  Patient Care Team: Tempie Hoist, FNP as PCP - General (Family Medicine)  CHIEF COMPLAINTS/PURPOSE OF CONSULTATION:  Stage II, T3 N0 Mx ascending colonic adenocarcinoma    Cancer of right colon ([place])   06/24/2016 Imaging    CT C/A/P There is a large annular constricting mass involving the mid aspect of the ascending colon most compatible with colonic carcinoma. There is a small adjacent foci of gas and free fluid raising the possibility of micro perforation. There is marked dilatation of the cecum and small bowel with fluid and gas most compatible with proximal obstruction caused by the large ascending colonic mass. Additionally there is a 6 cm fatty mass involving the colon at the level the hepatic flexure, most suggestive of a lipoma. Aortic vascular calcifications. Sigmoid colonic diverticulosis without evidence for acute diverticulitis.No specific findings identified to suggest metastatic disease to the chest.There is a 3 mm nodule identified within the left upper lobe, nonspecific.Bilateral pleural effusions with overlying compressive type atelectasis and consolidation       06/26/2016 Tumor Marker    CEA 39.3 ng/ml      06/27/2016 Surgery    Partial colectomy with primary linear stapled anastomosis.  Large bulky mid-ascending colonic mass extending into the colonic mesentery and retroperitoneum with a layer of not visibly/palpably involved fat between the colon and underlying visualized and exposed duodenum and Right kidney      06/27/2016 Pathology Results    COLONIC ADENOCARCINOMA (8.5 CM), GRADE 2 THE TUMOR INVADES THROUGH THE MUSCULARIS PROPRIA INTO PERICOLONIC TISSUE  ALL MARGINS OF RESECTION ARE NEGATIVE FOR CARCINOMA FOURTEEN BENIGN LYMPH NODES (0/14) TUBULAR ADENOMA (X3) UNREMARKABLE APPENDIX      06/30/2016 Imaging    CT angio chest Positive examination for right upper lobe and right lower lobe pulmonary emboli.  No evidence of right heart strain. Again demonstrated are bilateral pleural effusions and basilar atelectasis, greater on the right.       HISTORY OF PRESENTING ILLNESS:  Janet Hanson 70 y.o. female is here because of referral from Dr. Orvan Falconer for newly diagnosed colonic adenocarcinoma.  Janet Hanson is a pleasant 70 year-old female who originally presented to the ED on 06/24/2016 with worsening abdominal pain and colonic obstruction. She had been given antibiotics in [place] for an infection 2 weeks prior which helped with diarrhea and vomiting, though the pain persisted. Accordingly, a CT Abdomen/Pelvis was performed revealing a large annular constricting mass involving mid aspect of ascending colon most compatible with colonic carcinoma. No metastasis to the liver or surrounding lymph nodes was noted. Dilatation of the cecum and small bowel with fluid and gas, consistent with proximal obstruction was also noted. Pre-operative CEA was elevated at 39.3 ng/ml.   GI was consulted for biopsy, however, due to her obstruction, it was not possible. The patient was referred to Dr. Rosana Hoes of general surgery who performed a left hemicolectomy on 06/27/2016. Pathology showed clean margins, and no lymphatic spread was found. She is a T3 N0 Mx, colonic adenocarcinoma.  She was discharged from the hospital on 07/03/2016.  CT Chest on 06/30/2016 showed no specific findings identified to suggest metastatic disease to the chest. There was a 3 mm nodule identified within the left upper lobe, nonspecific. Bilateral pleural effusions with overlying compressive type atelectasis and consolidation. There was suggestion of filling defect within right lower lobe pulmonary artery. CT Angio Chest on 06/30/2016 showed positive examination for right upper lobe and right lower  lobe pulmonary emboli. No evidence of right heart strain. Again demonstrated are bilateral pleural effusions and basilar atelectasis, greater on the  right.  Janet Hanson is accompanied by her daughter.   I spoke with the patient about the iron deficiency anemia that was noticed prior to her surgery and treatment plans. States she had not eaten much in the month before her surgery. She denies ever seeing blood in her stool.  She quit smoking a little over one month ago after about 40 years of smoking. She now "fills her oral fixation with [employer]."  She was sick for about 5 weeks prior to all of this. She lost about 20 lbs. Her appetite is good now and she has gained some of the weight lost prior to diagnosis.  When asked if she has healed from surgery she states, "Pretty much so".   She does not describe her stools as watery, though "they are more loose than prior to surgery."  She has never had a colonoscopy or mammogram before. When asked if she would like to scheduled for a mammogram she states, "I'd rather not".   The patient is here for further evaluation and discussion of newly diagnosed Stage II colon cancer and surveillance plans. We also spoke about correction of iron deficiency anemia.    MEDICAL HISTORY:  Past Medical History:  Diagnosis Date  . Colon cancer ([place])   . Hypercholesterolemia   . Hypertension     SURGICAL HISTORY: Past Surgical History:  Procedure Laterality Date  . PARTIAL COLECTOMY N/A 06/27/2016   Procedure: PARTIAL COLECTOMY;  Surgeon: Vickie Epley, MD;  Location: [facility];  Service: General;  Laterality: N/A;    SOCIAL HISTORY: Social History   Social History  . Marital status: Widowed    Spouse name: N/A  . Number of children: N/A  . Years of education: N/A   Occupational History  . Not on file.   Social History Main Topics  . Smoking status: Former Smoker    Packs/day: 0.50    Years: 48.00    Types: Cigarettes    Quit date: 06/24/2016  . Smokeless tobacco: Never Used  . Alcohol use No     Comment: glass wine  twice per year  . Drug use: No  . Sexual activity: Not  on file     Comment: widowed- 1 daughter   Other Topics Concern  . Not on file   Social History Narrative  . No narrative on file   Widowed for 6 years 1 child 1 grandchild Ex smoker, quit June 24, 2016. Smoked since 70 years old so about 40 years total She is retired. Worked in [place] and [place]. 0 pets  FAMILY HISTORY: Family History  Problem Relation Age of Onset  . Hypertension Mother   . Other Father     some type of "stomach ailment"  . Suicidality Brother   . Stomach cancer Maternal Aunt   . Brain cancer Brother    Mother deceased at 62 yo. 2 years ago. She had high blood pressure. She fell and broke her hip. Didn't have any disease "as far as I know" Father deceased in his 66s. She is not sure what of. He ate Ex Lax all the time for constipation. 2 brothers. The patient is the middle child. Older brother was 43 years-old and committed suicide Youngest brother had brain cancer and died last year  ALLERGIES:  has No Known Allergies.  MEDICATIONS:  Current Outpatient  Prescriptions  Medication Sig Dispense Refill  . amLODipine (NORVASC) 5 MG tablet Take 5 mg by mouth daily.    Marland Kitchen escitalopram (LEXAPRO) 10 MG tablet Take 10 mg by mouth daily.    . Omega-3 Fatty Acids (FISH OIL) 1200 MG CAPS Take 1 capsule by mouth daily.    . Rivaroxaban 15 & 20 MG TBPK Take as directed on package: Start with one 31m tablet by mouth twice a day with food. On Day 22, switch to one 258mtablet once a day with food. 51 each 0  . simvastatin (ZOCOR) 40 MG tablet Take 40 mg by mouth daily.     No current facility-administered medications for this visit.     Review of Systems  Constitutional: Negative.  Negative for chills, diaphoresis, fever and malaise/fatigue.  HENT: Negative.  Negative for ear discharge, ear pain, hearing loss, nosebleeds and tinnitus.   Eyes: Negative.  Negative for blurred vision, double vision, photophobia and pain.  Respiratory: Negative.  Negative  for cough, hemoptysis, sputum production, shortness of breath and wheezing.   Cardiovascular: Negative.  Negative for chest pain, palpitations, orthopnea, claudication, leg swelling and PND.  Gastrointestinal: Negative.  Negative for abdominal pain and blood in stool.  Genitourinary: Negative.  Negative for dysuria, flank pain, frequency, hematuria and urgency.  Musculoskeletal: Negative.   Skin: Negative.  Negative for itching and rash.  Neurological: Negative.  Negative for dizziness, tingling, tremors, sensory change, speech change, focal weakness, seizures, loss of consciousness and headaches.  Endo/Heme/Allergies: Negative.   Psychiatric/Behavioral: Negative.  Negative for depression, hallucinations, memory loss, substance abuse and suicidal ideas. The patient is not nervous/anxious and does not have insomnia.   All other systems reviewed and are negative.  14 point ROS was done and is otherwise as detailed above or in HPI   PHYSICAL EXAMINATION: ECOG PERFORMANCE STATUS: 0 - Asymptomatic  Vitals:   07/30/16 1527  BP: 129/76  Pulse: (!) 101  Resp: 18  Temp: 98.7 F (37.1 C)   Filed Weights   07/30/16 1527  Weight: 155 lb 14.4 oz (70.7 kg)    Physical Exam  Constitutional: She is oriented to person, place, and time and well-developed, well-nourished, and in no distress.  HENT:  Head: Normocephalic and atraumatic.  Nose: Nose normal.  Mouth/Throat: Oropharynx is clear and moist. No oropharyngeal exudate.  Eyes: Conjunctivae and EOM are normal. Pupils are equal, round, and reactive to light. Right eye exhibits no discharge. Left eye exhibits no discharge. No scleral icterus.  Neck: Normal range of motion. Neck supple. No tracheal deviation present. No thyromegaly present.  Cardiovascular: Normal rate, regular rhythm and normal heart sounds.  Exam reveals no gallop and no friction rub.   No murmur heard. Pulmonary/Chest: Effort normal and breath sounds normal. She has no  wheezes. She has no rales.  Abdominal: Soft. Bowel sounds are normal. She exhibits no distension and no mass. There is no tenderness. There is no rebound and no guarding.  Abdominal incision site is well healed.   Musculoskeletal: Normal range of motion. She exhibits no edema.  Lymphadenopathy:    She has no cervical adenopathy.  Neurological: She is alert and oriented to person, place, and time. She has normal reflexes. No cranial nerve deficit. Gait normal. Coordination normal.  Skin: Skin is warm and dry. No rash noted.  Psychiatric: Mood, memory, affect and judgment normal.  Nursing note and vitals reviewed.   LABORATORY DATA:  I have reviewed the data as listed Lab Results  Component Value Date   WBC 9.0 07/30/2016   HGB 10.6 (L) 07/30/2016   HCT 34.5 (L) 07/30/2016   MCV 85.8 07/30/2016   PLT 367 07/30/2016   CMP     Component Value Date/Time   NA 135 07/30/2016 1646   K 3.9 07/30/2016 1646   CL 101 07/30/2016 1646   CO2 26 07/30/2016 1646   GLUCOSE 119 (H) 07/30/2016 1646   BUN 15 07/30/2016 1646   CREATININE 0.91 07/30/2016 1646   CALCIUM 8.9 07/30/2016 1646   PROT 7.7 07/30/2016 1646   ALBUMIN 3.9 07/30/2016 1646   AST 23 07/30/2016 1646   ALT 21 07/30/2016 1646   ALKPHOS 55 07/30/2016 1646   BILITOT 0.6 07/30/2016 1646   GFRNONAA >60 07/30/2016 1646   GFRAA >60 07/30/2016 1646   Results for LARENE, ASCENCIO (MRN [id number]) as of 07/31/2016 16:52  Ref. Range 06/26/2016 10:11 06/26/2016 10:14  Iron Latest Ref Range: 28 - 170 ug/dL  7 (L)  UIBC Latest Units: ug/dL  307  TIBC Latest Ref Range: 250 - 450 ug/dL  314  Saturation Ratios Latest Ref Range: 10.4 - 31.8 %  2 (L)  Ferritin Latest Ref Range: 11 - 307 ng/mL  21  CEA Latest Ref Range: 0.0 - 4.7 ng/mL 39.3 (H)     RADIOGRAPHIC STUDIES: I have personally reviewed the radiological images as listed and agreed with the findings in the report. Study Result   CLINICAL DATA:  Possible pulmonary embolus  suggested on previous routine CT of the chest done earlier today.  EXAM: CT ANGIOGRAPHY CHEST WITH CONTRAST  TECHNIQUE: Multidetector CT imaging of the chest was performed using the standard protocol during bolus administration of intravenous contrast. Multiplanar CT image reconstructions and MIPs were obtained to evaluate the vascular anatomy.  CONTRAST:  100 mL Isovue 370  COMPARISON:  06/30/2016  FINDINGS: Technically adequate study with good opacification of the central and segmental pulmonary arteries. Filling defects are demonstrated central right upper lobe and right lower lobe branches consistent with nonocclusive acute pulmonary embolus. This corresponds to abnormality seen on previous study. RV to LV ratio is normal suggesting no evidence of right heart strain.  Normal heart size. Normal caliber thoracic aorta. Coronary artery calcifications. No aortic dissection. Great vessel origins are patent. Esophagus is decompressed. No significant lymphadenopathy in the chest. Mediastinal lymph nodes are not pathologically enlarged.  Small bilateral pleural effusions with basilar atelectasis, greater on the right. This is unchanged since prior study. Evaluation of lungs is limited due to motion artifact. Patchy ground-glass areas and nodule seen on previous study are not well depicted on the current study. No pneumothorax.  Included portions of the upper abdominal organs demonstrate minimal upper abdominal ascites. Degenerative changes in spine. No destructive bone lesions.  Review of the MIP images confirms the above findings.  IMPRESSION: Positive examination for right upper lobe and right lower lobe pulmonary emboli. No evidence of right heart strain. Again demonstrated are bilateral pleural effusions and basilar atelectasis, greater on the right.  These results were called by telephone at the time of interpretation on 06/30/2016 at 6:42 pm to [facility], the  patient's nurse on Dept 300, who verbally acknowledged these results.   Electronically Signed   By: Lucienne Capers M.D.   On: 06/30/2016 18:49   Study Result   CLINICAL DATA:  Malignant colon mass.  EXAM: CT CHEST WITH CONTRAST  TECHNIQUE: Multidetector CT imaging of the chest was performed during intravenous contrast administration.  CONTRAST:  16m ISOVUE-300 IOPAMIDOL (ISOVUE-300) INJECTION 61%  COMPARISON:  None.  FINDINGS: Cardiovascular: Mild cardiac enlargement. There is no pericardial effusion. Aortic atherosclerosis noted. Calcification involving the LAD and left circumflex coronary arteries noted. There is a suggestion of a filling defect within the right lower lobe pulmonary artery, image number 86 of series 5.  Mediastinum/Nodes: The trachea appears patent and is midline. Normal appearance of the esophagus. No enlarged mediastinal or hilar lymph nodes. No axillary or supraclavicular adenopathy.  Lungs/Pleura: Bilateral pleural effusions identified right greater in left. Overlying compressive type atelectasis and consolidation noted. A few patchy areas of ground-glass attenuation noted within the upper lobes. Nonspecific pulmonary nodule in left upper lobe measures 3 mm, image 30 of series 4.  Upper Abdomen: Pneumoperitoneum is noted, likely secondary to recent surgery. A small amount of fluid identified around the liver. The visualized portions of the liver, spleen, adrenal glands.  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  IMPRESSION: 1. No specific findings identified to suggest metastatic disease to the chest. 2. There is a 3 mm nodule identified within the left upper lobe, nonspecific. 3. Bilateral pleural effusions with overlying compressive type atelectasis and consolidation. 4. Suggestion of filling defect within right lower lobe pulmonary artery. Note examination is not optimized to assess the  pulmonary arteries for pulmonary embolus. Consider further evaluation with CT of the chest. These results were called by telephone at the time of interpretation on 06/30/2016 at 10:25 am to Dr. TRobynn Pane, who verbally acknowledged these results.   Electronically Signed   By: TKerby MoorsM.D.   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 We also spoke about correction of her iron deficiency and further evaluation of her anemia.   The patient was given an informational NCCN guideline booklet about colon cancer. I reviewed this booklet with the patient at length.   I discussed the controversy of chemotherapy in Stage II CRC. I advised the patient that she generally has a good prognosis and chemotherapy has real toxicities. We discussed the small benefit of adjuvant therapy in reducing risk of recurrence of her disease.  From uptodate: Predicted five year DFS estimates for patients with node-negative colon cancer  Disease stage Low grade High grade   Surgery alone, percent Surgery and chemo, percent Surgery alone, percent Surgery and chemo, percent  T3N0 73 (69 to 76) 77 (74 to 80) 65 (60 to 70) 70 (65 to 74)  T4N0 60 (54 to 68) 66 (59 to 73) 51 (43 to 60) 57 (49 to 66)  Range of numbers in parentheses represent 95 percent confidence intervals.  DFS: disease-free survival; chemo: chemotherapy.  Based upon a model derived from 1440 patients with node-negative colon cancer enrolled on seven randomized trials comparing adjuvant 5-fluorouracil-based treatment with surgery alone (Ferd Glassing et al. J Clin Oncol 2004; 22:1797).  Graphic [phone] Version 3.0  Looking at her pathology, she has "minimal high risk features" ie. No LVI, no PNI, tumor is moderately differentiated, 14 LN were sampled and a T3 tumor.  She did have obstruction and a high preoperatie CEA (although of uncertain significance). She would be a candidate for the 12 gene  recurrence score. She does NOT have evidence of MSI. Adjuvant chemotherapy should be considered for most patients with proficient MMR (pMMR) tumors and clinicopathologic high-risk features, particularly those with T4 cancers or multiple high-risk features  Patients without high-risk features who have MSI-unstable (MSI-H)/dMMR tumors have a favorable prognosis and are not likely to derive significant benefit from adjuvant fluoropyrimidine-based therapy.   As discussed above, the magnitude of the absolute overall survival benefit from adjuvant therapy seems to be very small for the average patient with stage II disease. However, most trials have shown at least a disease-free survival (DFS) benefit for adjuvant chemotherapy in patients with stage II disease.   We discussed all of the above. She is not interested in pursuing adjuvant chemotherapy and has opted for observation which is reasonable.  I would repeat her CEA.  Her anemia needs additional evaluation. Would repeat ferritin and iron studies. Also check B12 and folate. I will call with the results of these labs when available and make additional recommendations.  She has not had a complete C-scope, they are requesting a referral to Dr. Laural Golden. We will make this today.   The patient has never had a screening mammography and declines having one scheduled at this time. I will continue to speak with her about this at future visits.   She will tentatively return for follow up in 3 months. She is to continue on anticoagulation for her bilateral PE. We can discuss duration at follow-up in 3 months.   NCCN guidelines for surveillance for Colon cancer are  as follows (1.2017): B. Stage II, Stage III 1. H+P every 3-6 months x 2 years and then every 6 months for a total of 5 years  2. CEA every 3-6 months x 2 years and then every 6 months for a total of 5 years  3. CT CAP every 6-12 months (category 2B for frequency < 12 months) for a total of 5 years . 4.  Colonoscopy in 1 year except if no preoperative colonoscopy due to obstructing lesion, colonoscopy in 3-6 months.  A. If advanced adenoma, repeat in 1 year B. If no advanced adenoma, repeat in 3 years, then every 5 years 5. PET/CT scan is not recommended.  ORDERS PLACED FOR THIS ENCOUNTER: Orders Placed This Encounter  Procedures  . CBC with Differential  . CEA  . Comprehensive metabolic panel  . Ferritin  . Vitamin B12  . Folate  . CBC with Differential  . Comprehensive metabolic panel  . CEA  . Ferritin    All questions were answered. The patient knows to call the clinic with any problems, questions or concerns.  This document serves as a record of services personally performed by Ancil Linsey, MD. It was created on her behalf by Arlyce Harman, a trained medical scribe. The creation of this record is based on the scribe's personal observations and the provider's statements to them. This document has been checked and approved by the attending provider.  I have reviewed the above documentation for accuracy and completeness and I agree with the above.  This note was electronically signed.    Molli Hazard, MD  07/31/2016 4:58 PM

## 2016-07-30 NOTE — Patient Instructions (Addendum)
[place] at [facility] Discharge Instructions  RECOMMENDATIONS MADE BY THE CONSULTANT AND ANY TEST RESULTS WILL BE SENT TO YOUR REFERRING PHYSICIAN.  You saw Dr. Whitney Muse today. You will have labs today. We will call you with results. Follow up in 3 months with lab work. You are also being referred to Dr. Laural Golden for colonoscopy.  Thank you for choosing [place] at [facility] to provide your oncology and hematology care.  To afford each patient quality time with our provider, please arrive at least 15 minutes before your scheduled appointment time.   Beginning January 23rd 2017 lab work for the [facility] will be done in the  Main lab at [facility] on 1st floor. If you have a lab appointment with the [facility] please come in thru the  Main Entrance and check in at the main information desk  You need to re-schedule your appointment should you arrive 10 or more minutes late.  We strive to give you quality time with our providers, and arriving late affects you and other patients whose appointments are after yours.  Also, if you no show three or more times for appointments you may be dismissed from the clinic at the providers discretion.     Again, thank you for choosing [facility].  Our hope is that these requests will decrease the amount of time that you wait before being seen by our physicians.       _____________________________________________________________  Should you have questions after your visit to [facility], please contact our office at [phone] between the hours of 8:30 a.m. and 4:30 p.m.  Voicemails left after 4:30 p.m. will not be returned until the following business day.  For prescription refill requests, have your pharmacy contact our office.         Resources For Cancer Patients and their Caregivers ? American Cancer Society: Can assist with transportation, wigs, general needs,  runs Look Good Feel Better.        352-505-4680 ? Cancer Care: Provides financial assistance, online support groups, medication/co-pay assistance.  1-800-813-HOPE 705-657-4464) ? [facility] Assists [place] cancer patients and their families through emotional , educational and financial support.  321 123 5759 ? [place] DSS Where to apply for food stamps, Medicaid and utility assistance. (205)437-6819 ? [facility]: Transportation to medical appointments. (414)559-6259 ? Social Security Administration: May apply for disability if have a Stage IV cancer. 830-747-8398 (253) 296-4458 ? [employer], Disability and Transit Services: Assists with nutrition, care and transit needs. [facility] Support Programs: @10RELATIVEDAYS @ > Cancer Support Group  2nd Tuesday of the month 1pm-2pm, Journey Room  > Creative Journey  3rd Tuesday of the month 1130am-1pm, Journey Room  > Look Good Feel Better  1st Wednesday of the month 10am-12 noon, Journey Room (Call [facility] to register [phone])

## 2016-07-31 ENCOUNTER — Encounter (HOSPITAL_COMMUNITY): Payer: Self-pay | Admitting: Hematology & Oncology

## 2016-07-31 DIAGNOSIS — R970 Elevated carcinoembryonic antigen [CEA]: Secondary | ICD-10-CM | POA: Insufficient documentation

## 2016-07-31 LAB — CEA: CEA: 4 ng/mL (ref 0.0–4.7)

## 2016-08-07 ENCOUNTER — Encounter (INDEPENDENT_AMBULATORY_CARE_PROVIDER_SITE_OTHER): Payer: Self-pay | Admitting: Internal Medicine

## 2016-08-13 ENCOUNTER — Other Ambulatory Visit (HOSPITAL_COMMUNITY): Payer: Self-pay | Admitting: Hematology & Oncology

## 2016-08-13 DIAGNOSIS — E538 Deficiency of other specified B group vitamins: Secondary | ICD-10-CM

## 2016-08-13 HISTORY — DX: Deficiency of other specified B group vitamins: E53.8

## 2016-08-14 ENCOUNTER — Other Ambulatory Visit (HOSPITAL_COMMUNITY): Payer: Self-pay | Admitting: *Deleted

## 2016-08-14 ENCOUNTER — Telehealth (HOSPITAL_COMMUNITY): Payer: Self-pay | Admitting: *Deleted

## 2016-08-14 DIAGNOSIS — D500 Iron deficiency anemia secondary to blood loss (chronic): Secondary | ICD-10-CM

## 2016-08-14 DIAGNOSIS — E538 Deficiency of other specified B group vitamins: Secondary | ICD-10-CM

## 2016-08-14 MED ORDER — POLYSACCHARIDE IRON COMPLEX 150 MG PO CAPS: 150.0000 mg | ORAL_CAPSULE | Freq: Every day | ORAL | 6 refills | Status: DC

## 2016-08-14 NOTE — Progress Notes (Unsigned)
niferex

## 2016-08-19 ENCOUNTER — Encounter (HOSPITAL_COMMUNITY): Payer: Medicare Other | Attending: Hematology & Oncology

## 2016-08-19 ENCOUNTER — Encounter (HOSPITAL_COMMUNITY): Payer: Medicare Other

## 2016-08-19 ENCOUNTER — Encounter (HOSPITAL_COMMUNITY): Payer: Self-pay

## 2016-08-19 VITALS — BP 133/83 | HR 88 | Temp 97.6°F | Resp 18

## 2016-08-19 DIAGNOSIS — D500 Iron deficiency anemia secondary to blood loss (chronic): Secondary | ICD-10-CM | POA: Insufficient documentation

## 2016-08-19 DIAGNOSIS — E538 Deficiency of other specified B group vitamins: Secondary | ICD-10-CM

## 2016-08-19 MED ORDER — CYANOCOBALAMIN 1000 MCG/ML IJ SOLN
1000.0000 ug | Freq: Once | INTRAMUSCULAR | Status: AC
  Administered 2016-08-19: 1000 ug via INTRAMUSCULAR

## 2016-08-19 MED ORDER — CYANOCOBALAMIN 1000 MCG/ML IJ SOLN
INTRAMUSCULAR | Status: AC
  Filled 2016-08-19: qty 1

## 2016-08-19 MED ORDER — INFLUENZA VAC SPLIT QUAD 0.5 ML IM SUSY
PREFILLED_SYRINGE | INTRAMUSCULAR | Status: AC
  Filled 2016-08-19: qty 0.5

## 2016-08-19 NOTE — Progress Notes (Signed)
Janet Hanson presents today for injection per MD orders. B12 1,051mcg administered IM in right Upper Arm. Administration without incident. Patient tolerated well.   Vitals stable, discharged ambulatory from clinic

## 2016-08-19 NOTE — Patient Instructions (Signed)
[facility] at [facility] Discharge Instructions  RECOMMENDATIONS MADE BY THE CONSULTANT AND ANY TEST RESULTS WILL BE SENT TO YOUR REFERRING PHYSICIAN.  B12 injection given. Follow up as scheduled.  Thank you for choosing [facility] at [facility] to provide your oncology and hematology care.  To afford each patient quality time with our provider, please arrive at least 15 minutes before your scheduled appointment time.   Beginning January 23rd 2017 lab work for the Cancer Center will be done in the  Main lab at [place] on 1st floor. If you have a lab appointment with the Cancer Center please come in thru the  Main Entrance and check in at the main information desk  You need to re-schedule your appointment should you arrive 10 or more minutes late.  We strive to give you quality time with our providers, and arriving late affects you and other patients whose appointments are after yours.  Also, if you no show three or more times for appointments you may be dismissed from the clinic at the providers discretion.     Again, thank you for choosing [facility].  Our hope is that these requests will decrease the amount of time that you wait before being seen by our physicians.       _____________________________________________________________  Should you have questions after your visit to [facility], please contact our office at [phone] between the hours of 8:30 a.m. and 4:30 p.m.  Voicemails left after 4:30 p.m. will not be returned until the following business day.  For prescription refill requests, have your pharmacy contact our office.         Resources For Cancer Patients and their Caregivers ? American Cancer Society: Can assist with transportation, wigs, general needs, runs Look Good Feel Better.        1-888-227-6333 ? Cancer Care: Provides financial assistance, online support groups, medication/co-pay  assistance.  1-800-813-HOPE (4673) ? [facility] Assists [place] cancer patients and their families through emotional , educational and financial support.  336-427-4357 ? [place] DSS Where to apply for food stamps, Medicaid and utility assistance. 336-342-1394 ? RCATS: Transportation to medical appointments. 336-347-2287 ? Social Security Administration: May apply for disability if have a Stage IV cancer. 336-342-7796 1-800-772-1213 ? [place] Aging, Disability and Transit Services: Assists with nutrition, care and transit needs. 336-349-2343  Cancer Center Support Programs: [email or web address]@ > Cancer Support Group  2nd Tuesday of the month 1pm-2pm, Journey Room  > Creative Journey  3rd Tuesday of the month 1130am-1pm, Journey Room  > Look Good Feel Better  1st Wednesday of the month 10am-12 noon, Journey Room (Call American Cancer Society to register 1-800-395-5775)   

## 2016-08-20 ENCOUNTER — Encounter (INDEPENDENT_AMBULATORY_CARE_PROVIDER_SITE_OTHER): Payer: Self-pay | Admitting: Internal Medicine

## 2016-08-20 ENCOUNTER — Ambulatory Visit (INDEPENDENT_AMBULATORY_CARE_PROVIDER_SITE_OTHER): Payer: Medicare Other | Admitting: Internal Medicine

## 2016-08-20 VITALS — BP 120/64 | HR 84 | Temp 98.4°F | Ht 62.0 in | Wt 166.3 lb

## 2016-08-20 DIAGNOSIS — C182 Malignant neoplasm of ascending colon: Secondary | ICD-10-CM

## 2016-08-20 LAB — ANTI-PARIETAL ANTIBODY: Parietal Cell Antibody-IgG: 3 Units (ref 0.0–20.0)

## 2016-08-20 LAB — INTRINSIC FACTOR ANTIBODIES: INTRINSIC FACTOR: 1 [AU]/ml (ref 0.0–1.1)

## 2016-08-20 NOTE — Patient Instructions (Signed)
Will discuss with Dr.Rehman. 

## 2016-08-20 NOTE — Progress Notes (Signed)
   Subjective:    Patient ID: Janet Hanson, female    DOB: Dec 06, 1945, 70 y.o.   MRN: [id number]  HPI Presents today from Dr. Whitney Muse. She states she was told she needs a colonoscopy. Patient has never undergone a colonoscopy in the past. She tells me she is doing good. She has normal BM. She has not seen any blood. Her appetite is good. There is no abdominal. 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 Tama High. Biopsy: adenocarcinoma. Followed by Dr. Whitney Muse at the [facility] at [place].  She states she will not need chemo or radiation.  Review of Systems  07/30/2016 CEA 4.0.  CBC    Component Value Date/Time   WBC 9.0 07/30/2016 1646   RBC 4.02 07/30/2016 1646   HGB 10.6 (L) 07/30/2016 1646   HCT 34.5 (L) 07/30/2016 1646   PLT 367 07/30/2016 1646   MCV 85.8 07/30/2016 1646   MCH 26.4 07/30/2016 1646   MCHC 30.7 07/30/2016 1646   RDW 17.1 (H) 07/30/2016 1646   LYMPHSABS 3.0 07/30/2016 1646   MONOABS 0.7 07/30/2016 1646   EOSABS 0.2 07/30/2016 1646   BASOSABS 0.0 07/30/2016 1646        Objective:   Physical Exam Blood pressure 120/64, pulse 84, temperature 98.4 F (36.9 C), height 5\' 2"  (1.575 m), weight 166 lb 4.8 oz (75.4 kg).  Alert and oriented. Skin warm and dry. Oral mucosa is moist.   . Sclera anicteric, conjunctivae is pink. Thyroid not enlarged. No cervical lymphadenopathy. Lungs clear. Heart regular rate and rhythm.  Abdomen is soft. Bowel sounds are positive. No hepatomegaly. No abdominal masses felt. No tenderness.  No edema to lower extremities.  .       Assessment & Plan:  Hx of Colon cancer. Partial colectomy in August of this year. Will discuss with Dr. Laural Golden concerning timing of her colonoscopy.

## 2016-08-22 ENCOUNTER — Other Ambulatory Visit (INDEPENDENT_AMBULATORY_CARE_PROVIDER_SITE_OTHER): Payer: Self-pay | Admitting: Internal Medicine

## 2016-08-22 ENCOUNTER — Telehealth (INDEPENDENT_AMBULATORY_CARE_PROVIDER_SITE_OTHER): Payer: Self-pay | Admitting: Internal Medicine

## 2016-08-22 DIAGNOSIS — C189 Malignant neoplasm of colon, unspecified: Secondary | ICD-10-CM

## 2016-08-22 NOTE — Telephone Encounter (Signed)
Janet Hanson, Colonoscopy. I have talked with patient.

## 2016-08-25 ENCOUNTER — Telehealth (HOSPITAL_COMMUNITY): Payer: Self-pay | Admitting: Emergency Medicine

## 2016-08-25 DIAGNOSIS — C189 Malignant neoplasm of colon, unspecified: Secondary | ICD-10-CM | POA: Insufficient documentation

## 2016-08-25 NOTE — Telephone Encounter (Signed)
Pt called and started that she had just stated taking niferex and B12 injection and her stools had been a black color.  I re-assured her that is a side effects of the iron tablets.

## 2016-08-25 NOTE — Telephone Encounter (Signed)
TCS sch'd 11/05/16 at 120 (220), patient aware

## 2016-09-16 ENCOUNTER — Encounter (HOSPITAL_COMMUNITY): Payer: Medicare Other | Attending: Hematology & Oncology

## 2016-09-16 ENCOUNTER — Encounter (HOSPITAL_COMMUNITY): Payer: Self-pay

## 2016-09-16 VITALS — BP 144/80 | HR 88 | Temp 98.2°F | Resp 18

## 2016-09-16 DIAGNOSIS — Z87891 Personal history of nicotine dependence: Secondary | ICD-10-CM | POA: Insufficient documentation

## 2016-09-16 DIAGNOSIS — Z79899 Other long term (current) drug therapy: Secondary | ICD-10-CM | POA: Insufficient documentation

## 2016-09-16 DIAGNOSIS — C182 Malignant neoplasm of ascending colon: Secondary | ICD-10-CM | POA: Insufficient documentation

## 2016-09-16 DIAGNOSIS — D500 Iron deficiency anemia secondary to blood loss (chronic): Secondary | ICD-10-CM | POA: Insufficient documentation

## 2016-09-16 DIAGNOSIS — E538 Deficiency of other specified B group vitamins: Secondary | ICD-10-CM | POA: Diagnosis present

## 2016-09-16 MED ORDER — CYANOCOBALAMIN 1000 MCG/ML IJ SOLN
INTRAMUSCULAR | Status: AC
  Filled 2016-09-16: qty 1

## 2016-09-16 MED ORDER — CYANOCOBALAMIN 1000 MCG/ML IJ SOLN
1000.0000 ug | Freq: Once | INTRAMUSCULAR | Status: AC
  Administered 2016-09-16: 1000 ug via INTRAMUSCULAR

## 2016-09-16 NOTE — Progress Notes (Signed)
Janet Hanson presents today for injection per the provider's orders.  B12 administration without incident; see MAR for injection details.  Patient tolerated procedure well and without incident.  No questions or complaints noted at this time.

## 2016-09-16 NOTE — Patient Instructions (Signed)
[facility] at [facility] Discharge Instructions  RECOMMENDATIONS MADE BY THE CONSULTANT AND ANY TEST RESULTS WILL BE SENT TO YOUR REFERRING PHYSICIAN.  B12 injection today. Return as scheduled for injections. Return as scheduled for lab work and office visit.  Thank you for choosing [facility] at [facility] to provide your oncology and hematology care.  To afford each patient quality time with our provider, please arrive at least 15 minutes before your scheduled appointment time.   Beginning January 23rd 2017 lab work for the Cancer Center will be done in the  Main lab at [place] on 1st floor. If you have a lab appointment with the Cancer Center please come in thru the  Main Entrance and check in at the main information desk  You need to re-schedule your appointment should you arrive 10 or more minutes late.  We strive to give you quality time with our providers, and arriving late affects you and other patients whose appointments are after yours.  Also, if you no show three or more times for appointments you may be dismissed from the clinic at the providers discretion.     Again, thank you for choosing [facility].  Our hope is that these requests will decrease the amount of time that you wait before being seen by our physicians.       _____________________________________________________________  Should you have questions after your visit to [facility], please contact our office at [phone] between the hours of 8:30 a.m. and 4:30 p.m.  Voicemails left after 4:30 p.m. will not be returned until the following business day.  For prescription refill requests, have your pharmacy contact our office.         Resources For Cancer Patients and their Caregivers ? American Cancer Society: Can assist with transportation, wigs, general needs, runs Look Good Feel Better.        1-888-227-6333 ? Cancer  Care: Provides financial assistance, online support groups, medication/co-pay assistance.  1-800-813-HOPE (4673) ? [facility] Assists [place] cancer patients and their families through emotional , educational and financial support.  336-427-4357 ? [place] DSS Where to apply for food stamps, Medicaid and utility assistance. 336-342-1394 ? [employer]: Transportation to medical appointments. 336-347-2287 ? Social Security Administration: May apply for disability if have a Stage IV cancer. 336-342-7796 1-800-772-1213 ? [place] Aging, Disability and Transit Services: Assists with nutrition, care and transit needs. 336-349-2343  Cancer Center Support Programs: @[email or web address]@ > Cancer Support Group  2nd Tuesday of the month 1pm-2pm, Journey Room  > Creative Journey  3rd Tuesday of the month 1130am-1pm, Journey Room  > Look Good Feel Better  1st Wednesday of the month 10am-12 noon, Journey Room (Call American Cancer Society to register 1-800-395-5775)   

## 2016-10-08 ENCOUNTER — Encounter (INDEPENDENT_AMBULATORY_CARE_PROVIDER_SITE_OTHER): Payer: Self-pay | Admitting: *Deleted

## 2016-10-08 ENCOUNTER — Telehealth (INDEPENDENT_AMBULATORY_CARE_PROVIDER_SITE_OTHER): Payer: Self-pay | Admitting: *Deleted

## 2016-10-08 MED ORDER — PEG 3350-KCL-NA BICARB-NACL 420 G PO SOLR: 4000.0000 mL | Freq: Once | ORAL | 0 refills | Status: AC

## 2016-10-08 NOTE — Telephone Encounter (Signed)
Patient needs trilyte 

## 2016-10-13 ENCOUNTER — Encounter (INDEPENDENT_AMBULATORY_CARE_PROVIDER_SITE_OTHER): Payer: Self-pay | Admitting: *Deleted

## 2016-10-13 ENCOUNTER — Telehealth (INDEPENDENT_AMBULATORY_CARE_PROVIDER_SITE_OTHER): Payer: Self-pay | Admitting: *Deleted

## 2016-10-13 NOTE — Telephone Encounter (Signed)
Per Scarlett with Shari Prows it is ok for patient to hold coumadin 5 days prior to TCS, patient is aware

## 2016-10-14 ENCOUNTER — Encounter (HOSPITAL_COMMUNITY): Payer: Medicare Other | Attending: Hematology & Oncology

## 2016-10-14 ENCOUNTER — Encounter (HOSPITAL_COMMUNITY): Payer: Self-pay

## 2016-10-14 VITALS — BP 117/81 | HR 95 | Temp 98.5°F | Resp 18

## 2016-10-14 DIAGNOSIS — Z87891 Personal history of nicotine dependence: Secondary | ICD-10-CM | POA: Insufficient documentation

## 2016-10-14 DIAGNOSIS — Z79899 Other long term (current) drug therapy: Secondary | ICD-10-CM | POA: Insufficient documentation

## 2016-10-14 DIAGNOSIS — C182 Malignant neoplasm of ascending colon: Secondary | ICD-10-CM | POA: Insufficient documentation

## 2016-10-14 DIAGNOSIS — D500 Iron deficiency anemia secondary to blood loss (chronic): Secondary | ICD-10-CM | POA: Insufficient documentation

## 2016-10-14 DIAGNOSIS — E538 Deficiency of other specified B group vitamins: Secondary | ICD-10-CM | POA: Diagnosis present

## 2016-10-14 MED ORDER — CYANOCOBALAMIN 1000 MCG/ML IJ SOLN
INTRAMUSCULAR | Status: AC
  Filled 2016-10-14: qty 1

## 2016-10-14 MED ORDER — CYANOCOBALAMIN 1000 MCG/ML IJ SOLN
1000.0000 ug | Freq: Once | INTRAMUSCULAR | Status: AC
  Administered 2016-10-14: 1000 ug via INTRAMUSCULAR

## 2016-10-14 NOTE — Progress Notes (Signed)
Janet Hanson presents today for injection per the provider's orders.  B12 administration without incident; see MAR for injection details.  Patient tolerated procedure well and without incident.  No questions or complaints noted at this time.

## 2016-10-14 NOTE — Patient Instructions (Signed)
[facility] at [facility] Discharge Instructions  RECOMMENDATIONS MADE BY THE CONSULTANT AND ANY TEST RESULTS WILL BE SENT TO YOUR REFERRING PHYSICIAN.  B12 injection today. Return as scheduled for injections. Return as scheduled for lab work and office visit.  Thank you for choosing [facility] at [facility] to provide your oncology and hematology care.  To afford each patient quality time with our provider, please arrive at least 15 minutes before your scheduled appointment time.   Beginning January 23rd 2017 lab work for the Cancer Center will be done in the  Main lab at [place] on 1st floor. If you have a lab appointment with the Cancer Center please come in thru the  Main Entrance and check in at the main information desk  You need to re-schedule your appointment should you arrive 10 or more minutes late.  We strive to give you quality time with our providers, and arriving late affects you and other patients whose appointments are after yours.  Also, if you no show three or more times for appointments you may be dismissed from the clinic at the providers discretion.     Again, thank you for choosing [facility].  Our hope is that these requests will decrease the amount of time that you wait before being seen by our physicians.       _____________________________________________________________  Should you have questions after your visit to [facility], please contact our office at [phone] between the hours of 8:30 a.m. and 4:30 p.m.  Voicemails left after 4:30 p.m. will not be returned until the following business day.  For prescription refill requests, have your pharmacy contact our office.         Resources For Cancer Patients and their Caregivers ? American Cancer Society: Can assist with transportation, wigs, general needs, runs Look Good Feel Better.        1-888-227-6333 ? Cancer  Care: Provides financial assistance, online support groups, medication/co-pay assistance.  1-800-813-HOPE (4673) ? [facility] Assists [place] cancer patients and their families through emotional , educational and financial support.  336-427-4357 ? [place] DSS Where to apply for food stamps, Medicaid and utility assistance. 336-342-1394 ? [employer]: Transportation to medical appointments. 336-347-2287 ? Social Security Administration: May apply for disability if have a Stage IV cancer. 336-342-7796 1-800-772-1213 ? [place] Aging, Disability and Transit Services: Assists with nutrition, care and transit needs. 336-349-2343  Cancer Center Support Programs: @[email or web address]@ > Cancer Support Group  2nd Tuesday of the month 1pm-2pm, Journey Room  > Creative Journey  3rd Tuesday of the month 1130am-1pm, Journey Room  > Look Good Feel Better  1st Wednesday of the month 10am-12 noon, Journey Room (Call American Cancer Society to register 1-800-395-5775)   

## 2016-10-29 ENCOUNTER — Encounter (HOSPITAL_COMMUNITY): Payer: Medicare Other

## 2016-10-29 ENCOUNTER — Encounter (HOSPITAL_BASED_OUTPATIENT_CLINIC_OR_DEPARTMENT_OTHER): Payer: Medicare Other | Admitting: Oncology

## 2016-10-29 ENCOUNTER — Encounter (HOSPITAL_COMMUNITY): Payer: Self-pay | Admitting: Oncology

## 2016-10-29 VITALS — BP 148/87 | HR 94 | Temp 98.4°F | Resp 16 | Wt 188.1 lb

## 2016-10-29 DIAGNOSIS — D500 Iron deficiency anemia secondary to blood loss (chronic): Secondary | ICD-10-CM | POA: Diagnosis present

## 2016-10-29 DIAGNOSIS — E538 Deficiency of other specified B group vitamins: Secondary | ICD-10-CM

## 2016-10-29 DIAGNOSIS — C182 Malignant neoplasm of ascending colon: Secondary | ICD-10-CM | POA: Diagnosis present

## 2016-10-29 DIAGNOSIS — Z87891 Personal history of nicotine dependence: Secondary | ICD-10-CM | POA: Diagnosis not present

## 2016-10-29 DIAGNOSIS — Z79899 Other long term (current) drug therapy: Secondary | ICD-10-CM | POA: Diagnosis not present

## 2016-10-29 LAB — COMPREHENSIVE METABOLIC PANEL
ALBUMIN: 3.8 g/dL (ref 3.5–5.0)
ALT: 23 U/L (ref 14–54)
AST: 23 U/L (ref 15–41)
Alkaline Phosphatase: 54 U/L (ref 38–126)
Anion gap: 10 (ref 5–15)
BUN: 18 mg/dL (ref 6–20)
CO2: 26 mmol/L (ref 22–32)
CREATININE: 0.67 mg/dL (ref 0.44–1.00)
Calcium: 9.1 mg/dL (ref 8.9–10.3)
Chloride: 98 mmol/L — ABNORMAL LOW (ref 101–111)
GFR calc non Af Amer: >60 mL/min (ref >60)
Glucose, Bld: 132 mg/dL — ABNORMAL HIGH (ref 65–99)
Potassium: 4.4 mmol/L (ref 3.5–5.1)
SODIUM: 134 mmol/L — AB (ref 135–145)
Total Bilirubin: 0.5 mg/dL (ref 0.3–1.2)
Total Protein: 7.5 g/dL (ref 6.5–8.1)

## 2016-10-29 LAB — CBC WITH DIFFERENTIAL/PLATELET
BASOS PCT: 0 %
Basophils Absolute: 0 10*3/uL (ref 0.0–0.1)
EOS PCT: 2 %
Eosinophils Absolute: 0.2 10*3/uL (ref 0.0–0.7)
HEMATOCRIT: 37.3 % (ref 36.0–46.0)
Hemoglobin: 11.6 g/dL — ABNORMAL LOW (ref 12.0–15.0)
Lymphocytes Relative: 30 %
Lymphs Abs: 2.6 10*3/uL (ref 0.7–4.0)
MCH: 27.9 pg (ref 26.0–34.0)
MCHC: 31.1 g/dL (ref 30.0–36.0)
MCV: 89.7 fL (ref 78.0–100.0)
MONO ABS: 0.8 10*3/uL (ref 0.1–1.0)
MONOS PCT: 9 %
NEUTROS ABS: 5.3 10*3/uL (ref 1.7–7.7)
Neutrophils Relative %: 59 %
Platelets: 307 10*3/uL (ref 150–400)
RBC: 4.16 MIL/uL (ref 3.87–5.11)
RDW: 14.6 % (ref 11.5–15.5)
WBC: 8.9 10*3/uL (ref 4.0–10.5)

## 2016-10-29 LAB — FERRITIN: Ferritin: 12 ng/mL (ref 11–307)

## 2016-10-29 MED ORDER — CYANOCOBALAMIN 1000 MCG/ML IJ SOLN
1000.0000 ug | Freq: Once | INTRAMUSCULAR | Status: AC
  Administered 2016-10-29: 1000 ug via INTRAMUSCULAR

## 2016-10-29 MED ORDER — B-12 5000 MCG SL SUBL: 1.0000 | SUBLINGUAL_TABLET | Freq: Every day | SUBLINGUAL | 11 refills | Status: DC

## 2016-10-29 MED ORDER — CYANOCOBALAMIN 1000 MCG/ML IJ SOLN
INTRAMUSCULAR | Status: AC
  Filled 2016-10-29: qty 1

## 2016-10-29 NOTE — Progress Notes (Signed)
Janet Hanson, [place] [id number]  Cancer of right colon [facility]) - Plan: CT Abdomen Pelvis W Contrast  Iron deficiency anemia due to chronic blood loss  Vitamin B12 deficiency - Plan: Cyanocobalamin (B-12) 5000 MCG SUBL, cyanocobalamin ((VITAMIN B-12)) injection 1,000 mcg, CANCELED: SCHEDULING COMMUNICATION INJECTION  CURRENT THERAPY: Surveillance per NCCN guidelines  INTERVAL HISTORY: Janet Hanson 70 y.o. female returns for followup of Stage IIA (T3N0M0) adenocarcinoma of colon (right-sided) measuring 8.5 cm, grade 2, WITHOUT LVI or PNI, 0/14 lymph nodes with disease, negative margins, and MSI-Stable and preservation of expression of major and minor MMR proteins.  After review of risk factors (No LVI, no PNI, moderately differentiated, 14 negative lymph nodes, and T3 tumor, BUT with obstruction and high pre-operative CEA), good prognostic indicators, and low benefit to survival with adjuvant chemotherapy, patient opted to not pursue treatment.  Patients without high-risk features who have MSI-unstable (MSI-H)/dMMR tumors have a favorable prognosis and are not likely to derive significant benefit from adjuvant fluoropyrimidine-based therapy.  AND Iron deficiency anemia, secondary to chronic GI blood loss from colon cancer. AND B12 deficiency with negative intrinsic factor and anti-parietal cell antibody testing on 08/19/2016.    Cancer of right colon ([place])   06/24/2016 Imaging    CT C/A/P There is a large annular constricting mass involving the mid aspect of the ascending colon most compatible with colonic carcinoma. There is a small adjacent foci of gas and free fluid raising the possibility of micro perforation. There is marked dilatation of the cecum and small bowel with fluid and gas most compatible with proximal obstruction caused by the large ascending colonic mass. Additionally there is a 6 cm fatty mass involving the colon at  the level the hepatic flexure, most suggestive of a lipoma. Aortic vascular calcifications. Sigmoid colonic diverticulosis without evidence for acute diverticulitis.No specific findings identified to suggest metastatic disease to the chest.There is a 3 mm nodule identified within the left upper lobe, nonspecific.Bilateral pleural effusions with overlying compressive type atelectasis and consolidation       06/26/2016 Tumor Marker    CEA 39.3 ng/ml      06/27/2016 Surgery    Partial colectomy with primary linear stapled anastomosis.  Large bulky mid-ascending colonic mass extending into the colonic mesentery and retroperitoneum with a layer of not visibly/palpably involved fat between the colon and underlying visualized and exposed duodenum and Right kidney      06/27/2016 Pathology Results    COLONIC ADENOCARCINOMA (8.5 CM), GRADE 2 THE TUMOR INVADES THROUGH THE MUSCULARIS PROPRIA INTO PERICOLONIC TISSUE  ALL MARGINS OF RESECTION ARE NEGATIVE FOR CARCINOMA FOURTEEN BENIGN LYMPH NODES (0/14) TUBULAR ADENOMA (X3) UNREMARKABLE APPENDIX      06/30/2016 Imaging    CT angio chest Positive examination for right upper lobe and right lower lobe pulmonary emboli. No evidence of right heart strain. Again demonstrated are bilateral pleural effusions and basilar atelectasis, greater on the right.      07/30/2016 Tumor Marker      Ref. Range 07/30/2016 16:46  CEA Latest Ref Range: 0.0 - 4.7 ng/mL 4.0        07/30/2016 Miscellaneous    Medical Oncology consultation.  No adjuvant chemotherapy needed based upon risk factors and small benefit.  Informed decision made by patient to not pursue systemic chemotherapy in adjuvant setting.       She is doing well from an oncology perspective.  She notes that she  does not want to take any further B12 injections due to transportation issues.  With her negative antibody testing, we had anticipated switching her to PO replacement therapy.  She does  not want to take oral iron any more either.  She is tolerating well.  She notes that PO iron has made her anal flatulence malodorous.  As a result, she wants to stop this medication.  We had a long discussion about this.  She is educated on the likely cause of her iron deficiency anemia was her colon cancer and chronic GI blood loss from that.  I think it is reasonable to stop PO iron when her HGB is normal and iron is replaced.  She otherwise is tolerating this medication well.   She denies any abdominal or bowel complaints otherwise (except for PO iron-induced dark stools).  She quit smoking in Sept 2017.  Review of Systems  Constitutional: Negative for chills, fever and weight loss.  HENT: Negative.   Respiratory: Negative.  Negative for cough.   Cardiovascular: Negative.  Negative for chest pain.  Gastrointestinal: Negative.  Negative for abdominal pain, blood in stool, constipation, diarrhea and melena.  Genitourinary: Negative.   Musculoskeletal: Negative.   Skin: Negative.   Neurological: Negative.  Negative for weakness.  Psychiatric/Behavioral: Negative.     Past Medical History:  Diagnosis Date  . Cancer of right colon (Janet Hanson) 07/30/2016  . Colon cancer (Janet Hanson)   . Hypercholesterolemia   . Hypertension   . Iron deficiency anemia due to chronic blood loss 07/30/2016  . Vitamin B12 deficiency 08/13/2016    Past Surgical History:  Procedure Laterality Date  . PARTIAL COLECTOMY N/A 06/27/2016   Procedure: PARTIAL COLECTOMY;  Surgeon: Vickie Epley, MD;  Location: [facility];  Service: General;  Laterality: N/A;    Family History  Problem Relation Age of Onset  . Hypertension Mother   . Other Father     some type of "stomach ailment"  . Suicidality Brother   . Stomach cancer Maternal Aunt   . Brain cancer Brother     Social History   Social History  . Marital status: Widowed    Spouse name: N/A  . Number of children: N/A  . Years of education: N/A   Social History  Main Topics  . Smoking status: Former Smoker    Packs/day: 0.50    Years: 48.00    Types: Cigarettes    Quit date: 06/24/2016  . Smokeless tobacco: Never Used     Comment: quit smoking Sept 2017  . Alcohol use No     Comment: glass wine  twice per year  . Drug use: No  . Sexual activity: Not Asked     Comment: widowed- 1 daughter   Other Topics Concern  . None   Social History Narrative  . None     PHYSICAL EXAMINATION  ECOG PERFORMANCE STATUS: 1 - Symptomatic but completely ambulatory  Vitals:   10/29/16 1335  BP: (!) 148/87  Pulse: 94  Resp: 16  Temp: 98.4 F (36.9 C)    GENERAL:alert, no distress, well nourished, well developed, comfortable, cooperative, obese, smiling and unaccompanied SKIN: skin color, texture, turgor are normal, no rashes or significant lesions HEAD: Normocephalic, No masses, lesions, tenderness or abnormalities EYES: normal, EOMI, Conjunctiva are pink and non-injected EARS: External ears normal OROPHARYNX:lips, buccal mucosa, and tongue normal and mucous membranes are moist  NECK: supple, trachea midline LYMPH:  no palpable lymphadenopathy BREAST:not examined LUNGS: clear to auscultation  HEART:  regular rate & rhythm ABDOMEN:abdomen soft and normal bowel sounds BACK: Back symmetric, no curvature. EXTREMITIES:less then 2 second capillary refill, no joint deformities, effusion, or inflammation, no skin discoloration, no cyanosis  NEURO: alert & oriented x 3 with fluent speech, no focal motor/sensory deficits, gait normal  LABORATORY DATA: CBC    Component Value Date/Time   WBC 8.9 10/29/2016 1230   RBC 4.16 10/29/2016 1230   HGB 11.6 (L) 10/29/2016 1230   HCT 37.3 10/29/2016 1230   PLT 307 10/29/2016 1230   MCV 89.7 10/29/2016 1230   MCH 27.9 10/29/2016 1230   MCHC 31.1 10/29/2016 1230   RDW 14.6 10/29/2016 1230   LYMPHSABS 2.6 10/29/2016 1230   MONOABS 0.8 10/29/2016 1230   EOSABS 0.2 10/29/2016 1230   BASOSABS 0.0 10/29/2016  1230      Chemistry      Component Value Date/Time   NA 134 (L) 10/29/2016 1230   K 4.4 10/29/2016 1230   CL 98 (L) 10/29/2016 1230   CO2 26 10/29/2016 1230   BUN 18 10/29/2016 1230   CREATININE 0.67 10/29/2016 1230      Component Value Date/Time   CALCIUM 9.1 10/29/2016 1230   ALKPHOS 54 10/29/2016 1230   AST 23 10/29/2016 1230   ALT 23 10/29/2016 1230   BILITOT 0.5 10/29/2016 1230     Lab Results  Component Value Date   IRON 7 (L) 06/26/2016   TIBC 314 06/26/2016   FERRITIN 9 (L) 07/30/2016   Lab Results  Component Value Date   VITAMINB12 187 07/30/2016     PENDING LABS:   RADIOGRAPHIC STUDIES:  No results found.   PATHOLOGY:    ASSESSMENT AND PLAN:  Cancer of right colon ([place]) Stage IIA (T3N0M0) adenocarcinoma of colon (right-sided) measuring 8.5 cm, grade 2, WITHOUT LVI or PNI, 0/14 lymph nodes with disease, negative margins, and MSI-Stable and preservation of expression of major and minor MMR proteins.  After review of risk factors (No LVI, no PNI, moderately differentiated, 14 negative lymph nodes, and T3 tumor, BUT with obstruction and high pre-operative CEA), good prognostic indicators, and low benefit to survival with adjuvant chemotherapy, patient opted to not pursue treatment.  Patients without high-risk features who have MSI-unstable (MSI-H)/dMMR tumors have a favorable prognosis and are not likely to derive significant benefit from adjuvant fluoropyrimidine-based therapy.   Oncology history updated.  Staging in [facility] problem list completed.  Labs today: CBC diff, CMET, CEA.  I personally reviewed and went over laboratory results with the patient.  The results are noted within this dictation.  Labs in 3 months: CBC diff, CMET, CEA.  She will be due for annual imaging surveillance in August 2018.  Order is placed.  She is scheduled for colonoscopy by Dr. Laural Golden next week (11/05/2016).  I have reviewed the NCCN guidelines pertaining to surveillance  for Stage II Colon Cancer.  Return in 3 months for follow-up.    Iron deficiency anemia due to chronic blood loss Iron deficiency anemia, secondary to chronic GI blood loss from colon cancer.  On PO Niferex beginning in September 2017.  Tolerating PO iron well with only the complaint of malodorous anal flatulence.  Labs today: CBC diff, ferritin.  I personally reviewed and went over laboratory results with the patient.  The results are noted within this dictation.    She will continue with PO iron.  I have recommended the addition of [phone] mg of Vit C to increase iron absorption.  Plan is to D/C  PO iron when HGB is normal and iron studies are normal with ferritin > 100.  Labs in 3 months: CBC diff, iron/TIBC, ferritin.  Vitamin B12 deficiency B12 deficiency with negative intrinsic factor and anti-parietal cell antibody testing on 08/19/2016.    On weekly B12 x 4.  Final B12 injection today.  Rx provided for B12 5000 mcg SL daily.   ORDERS PLACED FOR THIS ENCOUNTER: Orders Placed This Encounter  Procedures  . CT Abdomen Pelvis W Contrast    MEDICATIONS PRESCRIBED THIS ENCOUNTER: Meds ordered this encounter  Medications  . Cyanocobalamin (B-12) 5000 MCG SUBL    Sig: Place 1 tablet under the tongue daily.    Dispense:  30 tablet    Refill:  11    Order Specific Question:   Supervising Provider    Answer:   Patrici Ranks [id number]  . cyanocobalamin ((VITAMIN B-12)) injection 1,000 mcg    THERAPY PLAN:  NCCN guidelines for surveillance for Colon cancer are as follows (1.2017):  A. Stage I   1. Colonoscopy at year 1    A. If advanced adenoma, repeat in 1 year    B. If no advanced adenoma, repeat in 3 years, and then every 5 years.  B. Stage II, Stage III   1. H+P every 3-6 months x 2 years and then every 6 months for a total of 5 years    2. CEA every 3-6 months x 2 years and then every 6 months for a total of 5 years    3. CT CAP every 6-12 months (category 2B for  frequency < 12 months) for a total of 5 years .   4.  Colonoscopy in 1 year except if no preoperative colonoscopy due to obstructing lesion, colonoscopy in 3-6 months.     A. If advanced adenoma, repeat in 1 year    B. If no advanced adenoma, repeat in 3 years, then every 5 years   5. PET/CT scan is not recommended.  C. Stage IV   1. H+P every 3-6 months x 2 years and then every 6 months for a total of 5 years    2. CEA every 3 months x 2 years and then every 6 months for a total of 3- 5 years    3. CT CAP every 3-6 months (category 2B for frequency < 6 months) x 2 years., then every 6-12 months for a total of 5 years .   4. Colonoscopy in 1 year except if no preoperative colonoscopy due to obstructing lesion, colonoscopy in 3-6 months.     A. If advanced adenoma, repeat in 1 year    B. If no advanced adenoma, repeat in 3 years, then every 5 years   All questions were answered. The patient knows to call the clinic with any problems, questions or concerns. We can certainly see the patient much sooner if necessary.  Patient and plan discussed with Dr. Ancil Linsey and she is in agreement with the aforementioned.   This note is electronically signed by: Doy Mince 10/29/2016 1:49 PM

## 2016-10-29 NOTE — Progress Notes (Signed)
Pt given B12 injection in right deltoid. Pt tolerated well. Pt stable and discharged home ambulatory. Pt to return as scheduled.

## 2016-10-29 NOTE — Assessment & Plan Note (Addendum)
Stage IIA (T3N0M0) adenocarcinoma of colon (right-sided) measuring 8.5 cm, grade 2, WITHOUT LVI or PNI, 0/14 lymph nodes with disease, negative margins, and MSI-Stable and preservation of expression of major and minor MMR proteins.  After review of risk factors (No LVI, no PNI, moderately differentiated, 14 negative lymph nodes, and T3 tumor, BUT with obstruction and high pre-operative CEA), good prognostic indicators, and low benefit to survival with adjuvant chemotherapy, patient opted to not pursue treatment.  Patients without high-risk features who have MSI-unstable (MSI-H)/dMMR tumors have a favorable prognosis and are not likely to derive significant benefit from adjuvant fluoropyrimidine-based therapy.   Oncology history updated.  Staging in [facility] problem list completed.  Labs today: CBC diff, CMET, CEA.  I personally reviewed and went over laboratory results with the patient.  The results are noted within this dictation.  Labs in 3 months: CBC diff, CMET, CEA.  She will be due for annual imaging surveillance in August 2018.  Order is placed.  She is scheduled for colonoscopy by Dr. Laural Golden next week (11/05/2016).  I have reviewed the NCCN guidelines pertaining to surveillance for Stage II Colon Cancer.  Return in 3 months for follow-up.

## 2016-10-29 NOTE — Assessment & Plan Note (Addendum)
B12 deficiency with negative intrinsic factor and anti-parietal cell antibody testing on 08/19/2016.    On weekly B12 x 4.  Final B12 injection today.  Rx provided for B12 5000 mcg SL daily.

## 2016-10-29 NOTE — Assessment & Plan Note (Addendum)
Iron deficiency anemia, secondary to chronic GI blood loss from colon cancer.  On PO Niferex beginning in September 2017.  Tolerating PO iron well with only the complaint of malodorous anal flatulence.  Labs today: CBC diff, ferritin.  I personally reviewed and went over laboratory results with the patient.  The results are noted within this dictation.    She will continue with PO iron.  I have recommended the addition of [phone] mg of Vit C to increase iron absorption.  Plan is to D/C PO iron when HGB is normal and iron studies are normal with ferritin > 100.  Labs in 3 months: CBC diff, iron/TIBC, ferritin.

## 2016-10-29 NOTE — Patient Instructions (Signed)
[employer] at [facility] Discharge Instructions  RECOMMENDATIONS MADE BY THE CONSULTANT AND ANY TEST RESULTS WILL BE SENT TO YOUR REFERRING PHYSICIAN.  You were seen today by Kirby Crigler PA-C. Get Colonoscopy as scheduled on 11/05/16. B12 injection today. B12 5000 mcg SL, continue taking iron, ADD 500-1000mg  of vit C daily with iron. Return in 3 months for labs and follow up.  Thank you for choosing [place] at [facility] to provide your oncology and hematology care.  To afford each patient quality time with our provider, please arrive at least 15 minutes before your scheduled appointment time.   Beginning January 23rd 2017 lab work for the [facility] will be done in the  Main lab at [facility] on 1st floor. If you have a lab appointment with the [facility] please come in thru the  Main Entrance and check in at the main information desk  You need to re-schedule your appointment should you arrive 10 or more minutes late.  We strive to give you quality time with our providers, and arriving late affects you and other patients whose appointments are after yours.  Also, if you no show three or more times for appointments you may be dismissed from the clinic at the providers discretion.     Again, thank you for choosing [facility].  Our hope is that these requests will decrease the amount of time that you wait before being seen by our physicians.       _____________________________________________________________  Should you have questions after your visit to [facility], please contact our office at [phone] between the hours of 8:30 a.m. and 4:30 p.m.  Voicemails left after 4:30 p.m. will not be returned until the following business day.  For prescription refill requests, have your pharmacy contact our office.         Resources For Cancer Patients and their Caregivers ? American Cancer Society: Can  assist with transportation, wigs, general needs, runs Look Good Feel Better.        (615) 482-6125 ? Cancer Care: Provides financial assistance, online support groups, medication/co-pay assistance.  1-800-813-HOPE (251)060-7326) ? [place] Assists [place] cancer patients and their families through emotional , educational and financial support.  847 716 2259 ? [place] DSS Where to apply for food stamps, Medicaid and utility assistance. 424-463-4212 ? [facility]: Transportation to medical appointments. 8301597976 ? Social Security Administration: May apply for disability if have a Stage IV cancer. 226-431-7293 930 135 4292 ? [employer], Disability and Transit Services: Assists with nutrition, care and transit needs. [place] Support Programs: @10RELATIVEDAYS @ > Cancer Support Group  2nd Tuesday of the month 1pm-2pm, Journey Room  > Creative Journey  3rd Tuesday of the month 1130am-1pm, Journey Room  > Look Good Feel Better  1st Wednesday of the month 10am-12 noon, Journey Room (Call [place] to register [phone])

## 2016-10-30 LAB — CEA: CEA: 4.8 ng/mL — AB (ref 0.0–4.7)

## 2016-11-05 ENCOUNTER — Ambulatory Visit (HOSPITAL_COMMUNITY)
Admission: RE | Admit: 2016-11-05 | Discharge: 2016-11-05 | Disposition: A | Discharge status: Home / Self Care | Payer: Medicare Other | Source: Ambulatory Visit | Attending: Internal Medicine | Admitting: Internal Medicine

## 2016-11-05 ENCOUNTER — Encounter (HOSPITAL_COMMUNITY): Payer: Self-pay | Admitting: *Deleted

## 2016-11-05 ENCOUNTER — Encounter (HOSPITAL_COMMUNITY)
Admission: RE | Disposition: A | Discharge status: Home / Self Care | Payer: Self-pay | Source: Ambulatory Visit | Attending: Internal Medicine

## 2016-11-05 DIAGNOSIS — K5730 Diverticulosis of large intestine without perforation or abscess without bleeding: Secondary | ICD-10-CM | POA: Diagnosis not present

## 2016-11-05 DIAGNOSIS — Z980 Intestinal bypass and anastomosis status: Secondary | ICD-10-CM | POA: Insufficient documentation

## 2016-11-05 DIAGNOSIS — Z79899 Other long term (current) drug therapy: Secondary | ICD-10-CM | POA: Insufficient documentation

## 2016-11-05 DIAGNOSIS — Z87891 Personal history of nicotine dependence: Secondary | ICD-10-CM | POA: Insufficient documentation

## 2016-11-05 DIAGNOSIS — C189 Malignant neoplasm of colon, unspecified: Secondary | ICD-10-CM

## 2016-11-05 DIAGNOSIS — D123 Benign neoplasm of transverse colon: Secondary | ICD-10-CM | POA: Insufficient documentation

## 2016-11-05 DIAGNOSIS — E7800 Pure hypercholesterolemia, unspecified: Secondary | ICD-10-CM | POA: Insufficient documentation

## 2016-11-05 DIAGNOSIS — D374 Neoplasm of uncertain behavior of colon: Secondary | ICD-10-CM | POA: Diagnosis not present

## 2016-11-05 DIAGNOSIS — D127 Benign neoplasm of rectosigmoid junction: Secondary | ICD-10-CM | POA: Insufficient documentation

## 2016-11-05 DIAGNOSIS — Z1211 Encounter for screening for malignant neoplasm of colon: Secondary | ICD-10-CM | POA: Diagnosis not present

## 2016-11-05 DIAGNOSIS — Z7901 Long term (current) use of anticoagulants: Secondary | ICD-10-CM | POA: Diagnosis not present

## 2016-11-05 DIAGNOSIS — I10 Essential (primary) hypertension: Secondary | ICD-10-CM | POA: Insufficient documentation

## 2016-11-05 DIAGNOSIS — D125 Benign neoplasm of sigmoid colon: Secondary | ICD-10-CM | POA: Diagnosis not present

## 2016-11-05 DIAGNOSIS — Z09 Encounter for follow-up examination after completed treatment for conditions other than malignant neoplasm: Secondary | ICD-10-CM | POA: Diagnosis not present

## 2016-11-05 DIAGNOSIS — Z85038 Personal history of other malignant neoplasm of large intestine: Secondary | ICD-10-CM | POA: Insufficient documentation

## 2016-11-05 HISTORY — PX: COLONOSCOPY: SHX5424

## 2016-11-05 SURGERY — COLONOSCOPY
Anesthesia: Moderate Sedation

## 2016-11-05 MED ORDER — MIDAZOLAM HCL 5 MG/5ML IJ SOLN
INTRAMUSCULAR | Status: DC | PRN
  Administered 2016-11-05 (×3): 2 mg via INTRAVENOUS

## 2016-11-05 MED ORDER — MIDAZOLAM HCL 5 MG/5ML IJ SOLN
INTRAMUSCULAR | Status: AC
  Filled 2016-11-05: qty 10

## 2016-11-05 MED ORDER — MEPERIDINE HCL 50 MG/ML IJ SOLN
INTRAMUSCULAR | Status: DC
Start: 2016-11-05 — End: 2016-11-05
  Filled 2016-11-05: qty 1

## 2016-11-05 MED ORDER — MEPERIDINE HCL 50 MG/ML IJ SOLN
INTRAMUSCULAR | Status: DC | PRN
  Administered 2016-11-05 (×2): 25 mg via INTRAVENOUS

## 2016-11-05 MED ORDER — SODIUM CHLORIDE 0.9 % IV SOLN
INTRAVENOUS | Status: DC
  Administered 2016-11-05: 13:00:00 via INTRAVENOUS

## 2016-11-05 NOTE — Discharge Instructions (Signed)
Resume warfarin on 11/06/2016. Resume other medications as before. High fiber diet. No driving for 24 hours. Have INR checked in 7-10 days. Physician will call with biopsy results.     Colonoscopy, Adult, Care After This sheet gives you information about how to care for yourself after your procedure. Your doctor may also give you more specific instructions. If you have problems or questions, call your doctor. Follow these instructions at home: General instructions  For the first 24 hours after the procedure:  Do not drive or use machinery.  Do not sign important documents.  Do not drink alcohol.  Do your daily activities more slowly than normal.  Eat foods that are soft and easy to digest.  Rest often.  Take over-the-counter or prescription medicines only as told by your doctor.  It is up to you to get the results of your procedure. Ask your doctor, or the department performing the procedure, when your results will be ready. To help cramping and bloating:  Try walking around.  Put heat on your belly (abdomen) as told by your doctor. Use a heat source that your doctor recommends, such as a moist heat pack or a heating pad.  Put a towel between your skin and the heat source.  Leave the heat on for 20-30 minutes.  Remove the heat if your skin turns bright red. This is especially important if you cannot feel pain, heat, or cold. You can get burned. Eating and drinking  Drink enough fluid to keep your pee (urine) clear or pale yellow.  Return to your normal diet as told by your doctor. Avoid heavy or fried foods that are hard to digest.  Avoid drinking alcohol for as long as told by your doctor. Contact a doctor if:  You have blood in your poop (stool) 2-3 days after the procedure. Get help right away if:  You have more than a small amount of blood in your poop.  You see large clumps of tissue (blood clots) in your poop.  Your belly is swollen.  You feel sick  to your stomach (nauseous).  You throw up (vomit).  You have a fever.  You have belly pain that gets worse, and medicine does not help your pain. This information is not intended to replace advice given to you by your health care provider. Make sure you discuss any questions you have with your health care provider. Document Released: 11/22/2010 Document Revised: 07/14/2016 Document Reviewed: 07/14/2016 Elsevier Interactive Patient Education  2017 [place].   Colon Polyps Introduction Polyps are tissue growths inside the body. Polyps can grow in many places, including the large intestine (colon). A polyp may be a round bump or a mushroom-shaped growth. You could have one polyp or several. Most colon polyps are noncancerous (benign). However, some colon polyps can become cancerous over time. What are the causes? The exact cause of colon polyps is not known. What increases the risk? This condition is more likely to develop in people who:  Have a family history of colon cancer or colon polyps.  Are older than 66 or older than 45 if they are African American.  Have inflammatory bowel disease, such as ulcerative colitis or Crohn disease.  Are overweight.  Smoke cigarettes.  Do not get enough exercise.  Drink too much alcohol.  Eat a diet that is:  High in fat and red meat.  Low in fiber.  Had childhood cancer that was treated with abdominal radiation. What are the signs or symptoms?  Most polyps do not cause symptoms. If you have symptoms, they may include:  Blood coming from your rectum when having a bowel movement.  Blood in your stool.The stool may look dark red or black.  A change in bowel habits, such as constipation or diarrhea. How is this diagnosed? This condition is diagnosed with a colonoscopy. This is a procedure that uses a lighted, flexible scope to look at the inside of your colon. How is this treated? Treatment for this condition involves removing  any polyps that are found. Those polyps will then be tested for cancer. If cancer is found, your health care provider will talk to you about options for colon cancer treatment. Follow these instructions at home: Diet  Eat plenty of fiber, such as fruits, vegetables, and whole grains.  Eat foods that are high in calcium and vitamin D, such as milk, cheese, yogurt, eggs, liver, fish, and broccoli.  Limit foods high in fat, red meats, and processed meats, such as hot dogs, sausage, bacon, and lunch meats.  Maintain a healthy weight, or lose weight if recommended by your health care provider. General instructions  Do not smoke cigarettes.  Do not drink alcohol excessively.  Keep all follow-up visits as told by your health care provider. This is important. This includes keeping regularly scheduled colonoscopies. Talk to your health care provider about when you need a colonoscopy.  Exercise every day or as told by your health care provider. Contact a health care provider if:  You have new or worsening bleeding during a bowel movement.  You have new or increased blood in your stool.  You have a change in bowel habits.  You unexpectedly lose weight. This information is not intended to replace advice given to you by your health care provider. Make sure you discuss any questions you have with your health care provider. Document Released: 07/16/2004 Document Revised: 03/27/2016 Document Reviewed: 09/10/2015  2017 Elsevier     High-Fiber Diet Fiber, also called dietary fiber, is a type of carbohydrate found in fruits, vegetables, whole grains, and beans. A high-fiber diet can have many health benefits. Your health care provider may recommend a high-fiber diet to help:  Prevent constipation. Fiber can make your bowel movements more regular.  Lower your cholesterol.  Relieve hemorrhoids, uncomplicated diverticulosis, or irritable bowel syndrome.  Prevent overeating as part of a  weight-loss plan.  Prevent heart disease, type 2 diabetes, and certain cancers. What is my plan? The recommended daily intake of fiber includes:  38 grams for men under age 90.  65 grams for men over age 5.  58 grams for women under age 32.  80 grams for women over age 6. You can get the recommended daily intake of dietary fiber by eating a variety of fruits, vegetables, grains, and beans. Your health care provider may also recommend a fiber supplement if it is not possible to get enough fiber through your diet. What do I need to know about a high-fiber diet?  Fiber supplements have not been widely studied for their effectiveness, so it is better to get fiber through food sources.  Always check the fiber content on thenutrition facts label of any prepackaged food. Look for foods that contain at least 5 grams of fiber per serving.  Ask your dietitian if you have questions about specific foods that are related to your condition, especially if those foods are not listed in the following section.  Increase your daily fiber consumption gradually. Increasing your intake of  dietary fiber too quickly may cause bloating, cramping, or gas.  Drink plenty of water. Water helps you to digest fiber. What foods can I eat? Grains  Whole-grain breads. Multigrain cereal. Oats and oatmeal. Brown rice. Barley. Bulgur wheat. [place]. Bran muffins. Popcorn. [place]ye wafer crackers. Vegetables  Sweet potatoes. Spinach. Kale. Artichokes. Cabbage. Broccoli. Green peas. Carrots. Squash. Fruits  Berries. Pears. Apples. Oranges. Avocados. Prunes and raisins. Dried figs. Meats and Other Protein Sources  [place], kidney, pinto, and soy beans. Split peas. Lentils. Nuts and seeds. Dairy  Fiber-fortified yogurt. Beverages  Fiber-fortified soy milk. Fiber-fortified orange juice. Other  Fiber bars. The items listed above may not be a complete list of recommended foods or beverages. Contact your dietitian for more  options.  What foods are not recommended? Grains  White bread. Pasta made with refined flour. White rice. Vegetables  Fried potatoes. Canned vegetables. Well-cooked vegetables. Fruits  Fruit juice. Cooked, strained fruit. Meats and Other Protein Sources  Fatty cuts of meat. Fried Sales executive or fried fish. Dairy  Milk. Yogurt. Cream cheese. Sour cream. Beverages  Soft drinks. Other  Cakes and pastries. Butter and oils. The items listed above may not be a complete list of foods and beverages to avoid. Contact your dietitian for more information.  What are some tips for including high-fiber foods in my diet?  Eat a wide variety of high-fiber foods.  Make sure that half of all grains consumed each day are whole grains.  Replace breads and cereals made from refined flour or white flour with whole-grain breads and cereals.  Replace white rice with brown rice, bulgur wheat, or millet.  Start the day with a breakfast that is high in fiber, such as a cereal that contains at least 5 grams of fiber per serving.  Use beans in place of meat in soups, salads, or pasta.  Eat high-fiber snacks, such as berries, raw vegetables, nuts, or popcorn. This information is not intended to replace advice given to you by your health care provider. Make sure you discuss any questions you have with your health care provider. Document Released: 10/20/2005 Document Revised: 03/27/2016 Document Reviewed: 04/04/2014 Elsevier Interactive Patient Education  2017 [employer].

## 2016-11-05 NOTE — H&P (Signed)
Janet Hanson is an 71 y.o. female.   Chief Complaint: Patient is here for colonoscopy. 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 Preprocedure CEA was 39.3. Intraoperative foreign last week was 4.8. She has no symptoms. She denies abdominal pain or rectal bleeding. This is patient's first colonoscopy. She is being followed at oncology clinic. Family history is negative for CRC.  Past Medical History:  Diagnosis Date  . 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 Colon cancer ([place])   . Hypercholesterolemia   . Hypertension   . Iron deficiency anemia due to chronic blood loss 07/30/2016  . Vitamin B12 deficiency 08/13/2016    Past Surgical History:  Procedure Laterality Date  . PARTIAL COLECTOMY N/A 06/27/2016   Procedure: PARTIAL COLECTOMY;  Surgeon: Vickie Epley, MD;  Location: [facility];  Service: General;  Laterality: N/A;    Family History  Problem Relation Age of Onset  . Hypertension Mother   . Other Father     some type of "stomach ailment"  . Suicidality Brother   . Stomach cancer Maternal Aunt   . Brain cancer Brother    Social History:  reports that she quit smoking about 4 months ago. Her smoking use included Cigarettes. She has a 24.00 pack-year smoking history. She has never used smokeless tobacco. She reports that she does not drink alcohol or use drugs.  Allergies: No Known Allergies  Medications Prior to Admission  Medication Sig Dispense Refill  . acetaminophen (TYLENOL) 325 MG tablet Take 650 mg by mouth every 6 (six) hours as needed for mild pain.    Marland Kitchen amLODipine (NORVASC) 5 MG tablet Take 5 mg by mouth daily.    . Cyanocobalamin (B-12) 5000 MCG SUBL Place 1 tablet under the tongue daily. 30 tablet 11  . escitalopram (LEXAPRO) 10 MG tablet Take 10 mg by mouth daily.    . iron polysaccharides (NIFEREX) 150 MG capsule Take 1 capsule (150 mg total) by mouth daily.  30 capsule 6  . Omega-3 Fatty Acids (FISH OIL) 1200 MG CAPS Take 1 capsule by mouth daily.    . simvastatin (ZOCOR) 40 MG tablet Take 40 mg by mouth daily.    Marland Kitchen warfarin (COUMADIN) 5 MG tablet Take 5 mg by mouth daily at 6 PM.       No results found for this or any previous visit (from the past 48 hour(s)). No results found.  ROS  Blood pressure 130/88, pulse (!) 101, temperature 98.6 F (37 C), temperature source Oral, resp. rate 18, height 5\' 3"  (1.6 m), weight 188 lb (85.3 kg), SpO2 96 %. Physical Exam  Constitutional: She appears well-developed and well-nourished.  HENT:  Mouth/Throat: Oropharynx is clear and moist.  Eyes: Conjunctivae are normal. No scleral icterus.  Neck: No thyromegaly present.  Cardiovascular: Normal rate, regular rhythm and normal heart sounds.   No murmur heard. Respiratory: Effort normal and breath sounds normal.  GI:  Abdomen is full with well-healed midline scar. Abdomen is soft and nontender without organomegaly or masses.  Lymphadenopathy:    She has no cervical adenopathy.     Assessment/Plan History of CRC. Surveillance colonoscopy. This is patient's first exam. He presented with colonic obstruction and rectum proceeded to surgery.  Hildred Laser, MD 11/05/2016, 1:32 PM

## 2016-11-05 NOTE — Op Note (Signed)
[facility] Patient Name: Janet Hanson Procedure Date: 11/05/2016 1:04 PM MRN: [id number] Date of Birth: 29-Apr-1946 Attending MD: Hildred Laser , MD CSN: [id number] Age: 71 Admit Type: Outpatient Procedure:                Colonoscopy Indications:              High risk colon cancer surveillance: Personal                            history of colon cancer Providers:                Hildred Laser, MD, Charlyne Petrin RN, RN, Isabella Stalling, Technician Referring MD:             Tempie Hoist, FNP Molli Hazard, M.D                            and Tama High, M.D. Medicines:                Meperidine 50 mg IV, Midazolam 6 mg IV Complications:            No immediate complications. Estimated Blood Loss:     Estimated blood loss was minimal. Procedure:                Pre-Anesthesia Assessment:                           - Prior to the procedure, a History and Physical                            was performed, and patient medications and                            allergies were reviewed. The patient's tolerance of                            previous anesthesia was also reviewed. The risks                            and benefits of the procedure and the sedation                            options and risks were discussed with the patient.                            All questions were answered, and informed consent                            was obtained. Prior Anticoagulants: The patient                            last took Coumadin (warfarin) 5 days prior to the  procedure. ASA Grade Assessment: II - A patient                            with mild systemic disease. After reviewing the                            risks and benefits, the patient was deemed in                            satisfactory condition to undergo the procedure.                           After obtaining informed consent, the colonoscope              was passed under direct vision. Throughout the                            procedure, the patient's blood pressure, pulse, and                            oxygen saturations were monitored continuously. The                            [id number] scope was introduced through                            the anus and advanced to the the ileocolonic                            anastomosis. The colonoscopy was performed without                            difficulty. The patient tolerated the procedure                            well. The quality of the bowel preparation was                            good. The rectum ileocolonic anastamosis were                            photographed. Scope In: 1:43:13 PM Scope Out: 2:20:57 PM Scope Withdrawal Time: 0 hours 33 minutes 33 seconds  Total Procedure Duration: 0 hours 37 minutes 44 seconds  Findings:      The digital rectal exam findings include {skip}soft anal tags. Pertinent       negatives include normal sphincter tone.      There was evidence of a prior end-to-side colo-colonic anastomosis at       the hepatic flexure. This was patent and was characterized by healthy       appearing mucosa.      Three semi-sessile polyps were found in the proximal transverse colon.       The polyps were 5 to 7 mm in size. These polyps were removed with a cold       snare. Resection and  retrieval were complete. The pathology specimen was       placed into Bottle Number 1.      A 10 mm polyp was found in the proximal transverse colon. The polyp was       semi-sessile. The polyp was removed with a hot snare. Resection and       retrieval were complete. The pathology specimen was placed into Bottle       Number 1.      A 20 mm polyp was found in the proximal sigmoid colon. The polyp was       pedunculated. To prevent bleeding [Reason for Clip prior to polypectomy       polypectomy, one hemostatic clip was successfully placed (MR        conditional). The polyp was removed with a hot snare. Resection and       retrieval were complete. The pathology specimen was placed into Bottle       Number 2.      A 15 to 25 mm polyp was found in the recto-sigmoid colon. The polyp was       multi-lobulated and pedunculated. The polyp was removed with a hot       snare. Resection and retrieval were complete. To prevent bleeding       post-intervention, one hemostatic clip was successfully placed (MR       conditional). There was no bleeding during, or at the end, of the       procedure.      Multiple small and large-mouthed diverticula were found in the sigmoid       colon and descending colon.      The retroflexed view of the distal rectum and anal verge was normal and       showed no anal or rectal abnormalities.      Small lipoma noat proximal transverse colon. Impression:               - {skip}soft anal tags found on digital rectal exam.                           - Patent end-to-side colo-colonic anastomosis,                            characterized by healthy appearing mucosa.                           - Three 5 to 7 mm polyps in the proximal transverse                            colon, removed with a cold snare. Resected and                            retrieved.                           - One 10 mm polyp in the proximal transverse colon,                            removed with a hot snare. Resected and retrieved.                           -  One 20 mm polyp in the proximal sigmoid colon,                            removed with a hot snare. Resected and retrieved.                            Clip (MR conditional) was placed.                           - One 15 to 25 mm polyp at the recto-sigmoid colon,                            removed with a hot snare. Resected and retrieved.                            Clip (MR conditional) was placed.                           - Diverticulosis in the sigmoid colon and in the                             descending colon.                           - The distal rectum and anal verge are normal on                            retroflexion view. Moderate Sedation:      Moderate (conscious) sedation was administered by the endoscopy nurse       and supervised by the endoscopist. The following parameters were       monitored: oxygen saturation, heart rate, blood pressure, CO2       capnography and response to care. Total physician intraservice time was       43 minutes. Recommendation:           - Patient has a contact number available for                            emergencies. The signs and symptoms of potential                            delayed complications were discussed with the                            patient. Return to normal activities tomorrow.                            Written discharge instructions were provided to the                            patient.                           - High fiber diet today.                           -  Continue present medications.                           - Resume Coumadin (warfarin) at prior dose tomorrow.                           - Await pathology results.                           - Repeat colonoscopy in 1 year for surveillance. Procedure Code(s):        --- Professional ---                           [phone], Colonoscopy, flexible; with removal of                            tumor(s), polyp(s), or other lesion(s) by snare                            technique                           99152, Moderate sedation services provided by the                            same physician or other qualified health care                            professional performing the diagnostic or                            therapeutic service that the sedation supports,                            requiring the presence of an independent trained                            observer to assist in the monitoring of the                            patient's level of  consciousness and physiological                            status; initial 15 minutes of intraservice time,                            patient age 46 years or older                           [phone], Moderate sedation services; each additional                            15 minutes intraservice time                           99153, Moderate  sedation services; each additional                            15 minutes intraservice time Diagnosis Code(s):        --- Professional ---                           [phone], Personal history of other malignant                            neoplasm of large intestine                           Z98.0, Intestinal bypass and anastomosis status                           D12.3, Benign neoplasm of transverse colon (hepatic                            flexure or splenic flexure)                           D12.5, Benign neoplasm of sigmoid colon                           D12.7, Benign neoplasm of rectosigmoid junction                           K57.30, Diverticulosis of large intestine without                            perforation or abscess without bleeding CPT copyright 2016 [employer]. All rights reserved. The codes documented in this report are preliminary and upon coder review may  be revised to meet current compliance requirements. Hildred Laser, MD Hildred Laser, MD 11/05/2016 2:36:39 PM This report has been signed electronically. Number of Addenda: 0

## 2016-11-06 ENCOUNTER — Encounter (HOSPITAL_COMMUNITY): Payer: Self-pay | Admitting: Internal Medicine

## 2016-11-10 ENCOUNTER — Other Ambulatory Visit (HOSPITAL_COMMUNITY): Payer: Self-pay | Admitting: Oncology

## 2016-11-10 DIAGNOSIS — D500 Iron deficiency anemia secondary to blood loss (chronic): Secondary | ICD-10-CM

## 2016-11-10 DIAGNOSIS — C182 Malignant neoplasm of ascending colon: Secondary | ICD-10-CM

## 2016-12-05 ENCOUNTER — Other Ambulatory Visit (HOSPITAL_COMMUNITY): Payer: Self-pay | Admitting: Oncology

## 2016-12-05 DIAGNOSIS — C182 Malignant neoplasm of ascending colon: Secondary | ICD-10-CM

## 2016-12-05 DIAGNOSIS — D500 Iron deficiency anemia secondary to blood loss (chronic): Secondary | ICD-10-CM

## 2016-12-10 ENCOUNTER — Encounter (HOSPITAL_COMMUNITY): Payer: Medicare Other | Attending: Oncology

## 2016-12-10 DIAGNOSIS — C182 Malignant neoplasm of ascending colon: Secondary | ICD-10-CM | POA: Insufficient documentation

## 2016-12-10 DIAGNOSIS — D500 Iron deficiency anemia secondary to blood loss (chronic): Secondary | ICD-10-CM

## 2016-12-10 LAB — CBC WITH DIFFERENTIAL/PLATELET
Basophils Absolute: 0 10*3/uL (ref 0.0–0.1)
Basophils Relative: 0 %
Eosinophils Absolute: 0.2 10*3/uL (ref 0.0–0.7)
Eosinophils Relative: 2 %
HEMATOCRIT: 39.2 % (ref 36.0–46.0)
Hemoglobin: 11.9 g/dL — ABNORMAL LOW (ref 12.0–15.0)
LYMPHS PCT: 29 %
Lymphs Abs: 2.7 10*3/uL (ref 0.7–4.0)
MCH: 27.1 pg (ref 26.0–34.0)
MCHC: 30.4 g/dL (ref 30.0–36.0)
MCV: 89.3 fL (ref 78.0–100.0)
MONO ABS: 0.9 10*3/uL (ref 0.1–1.0)
Monocytes Relative: 9 %
NEUTROS ABS: 5.5 10*3/uL (ref 1.7–7.7)
Neutrophils Relative %: 60 %
Platelets: 280 10*3/uL (ref 150–400)
RBC: 4.39 MIL/uL (ref 3.87–5.11)
RDW: 14.6 % (ref 11.5–15.5)
WBC: 9.3 10*3/uL (ref 4.0–10.5)

## 2016-12-10 LAB — COMPREHENSIVE METABOLIC PANEL
ALK PHOS: 53 U/L (ref 38–126)
ALT: 29 U/L (ref 14–54)
AST: 30 U/L (ref 15–41)
Albumin: 4.1 g/dL (ref 3.5–5.0)
Anion gap: 8 (ref 5–15)
BILIRUBIN TOTAL: 0.6 mg/dL (ref 0.3–1.2)
BUN: 15 mg/dL (ref 6–20)
CALCIUM: 8.8 mg/dL — AB (ref 8.9–10.3)
CO2: 29 mmol/L (ref 22–32)
CREATININE: 0.67 mg/dL (ref 0.44–1.00)
Chloride: 99 mmol/L — ABNORMAL LOW (ref 101–111)
GFR calc Af Amer: >60 mL/min (ref >60)
Glucose, Bld: 167 mg/dL — ABNORMAL HIGH (ref 65–99)
Potassium: 4.3 mmol/L (ref 3.5–5.1)
Sodium: 136 mmol/L (ref 135–145)
TOTAL PROTEIN: 8 g/dL (ref 6.5–8.1)

## 2016-12-11 ENCOUNTER — Other Ambulatory Visit (HOSPITAL_COMMUNITY): Payer: Self-pay | Admitting: Oncology

## 2016-12-11 LAB — IRON AND TIBC
IRON: 44 ug/dL (ref 28–170)
Saturation Ratios: 9 % — ABNORMAL LOW (ref 10.4–31.8)
TIBC: 486 ug/dL — ABNORMAL HIGH (ref 250–450)
UIBC: 442 ug/dL

## 2016-12-11 LAB — FERRITIN: FERRITIN: 18 ng/mL (ref 11–307)

## 2016-12-11 LAB — CEA: CEA: 8.4 ng/mL — ABNORMAL HIGH (ref 0.0–4.7)

## 2016-12-16 ENCOUNTER — Ambulatory Visit (HOSPITAL_COMMUNITY): Payer: Medicare Other

## 2016-12-26 ENCOUNTER — Ambulatory Visit (HOSPITAL_COMMUNITY)
Admission: RE | Admit: 2016-12-26 | Discharge: 2016-12-26 | Disposition: A | Discharge status: Home / Self Care | Payer: Medicare Other | Source: Ambulatory Visit | Attending: Oncology | Admitting: Oncology

## 2016-12-26 DIAGNOSIS — I700 Atherosclerosis of aorta: Secondary | ICD-10-CM | POA: Diagnosis not present

## 2016-12-26 DIAGNOSIS — C182 Malignant neoplasm of ascending colon: Secondary | ICD-10-CM | POA: Insufficient documentation

## 2016-12-26 MED ORDER — IOPAMIDOL (ISOVUE-300) INJECTION 61%
100.0000 mL | Freq: Once | INTRAVENOUS | Status: AC | PRN
  Administered 2016-12-26: 100 mL via INTRAVENOUS

## 2017-01-06 ENCOUNTER — Other Ambulatory Visit (HOSPITAL_COMMUNITY): Payer: Medicare Other

## 2017-01-06 ENCOUNTER — Encounter (HOSPITAL_COMMUNITY): Payer: Medicare Other | Attending: Hematology & Oncology | Admitting: Oncology

## 2017-01-06 ENCOUNTER — Encounter (HOSPITAL_COMMUNITY): Payer: Self-pay | Admitting: Oncology

## 2017-01-06 DIAGNOSIS — D500 Iron deficiency anemia secondary to blood loss (chronic): Secondary | ICD-10-CM | POA: Insufficient documentation

## 2017-01-06 DIAGNOSIS — C182 Malignant neoplasm of ascending colon: Secondary | ICD-10-CM | POA: Insufficient documentation

## 2017-01-06 DIAGNOSIS — Z79899 Other long term (current) drug therapy: Secondary | ICD-10-CM | POA: Insufficient documentation

## 2017-01-06 DIAGNOSIS — Z87891 Personal history of nicotine dependence: Secondary | ICD-10-CM | POA: Insufficient documentation

## 2017-01-06 NOTE — Patient Instructions (Signed)
[place] at [facility] Discharge Instructions  RECOMMENDATIONS MADE BY THE CONSULTANT AND ANY TEST RESULTS WILL BE SENT TO YOUR REFERRING PHYSICIAN.  You were seen today by Kirby Crigler PA-C. Biopsy to be scheduled. Return in 2-3 weeks for follow up.  Thank you for choosing [place] at [facility] to provide your oncology and hematology care.  To afford each patient quality time with our provider, please arrive at least 15 minutes before your scheduled appointment time.    If you have a lab appointment with the [place] please come in thru the  Main Entrance and check in at the main information desk  You need to re-schedule your appointment should you arrive 10 or more minutes late.  We strive to give you quality time with our providers, and arriving late affects you and other patients whose appointments are after yours.  Also, if you no show three or more times for appointments you may be dismissed from the clinic at the providers discretion.     Again, thank you for choosing [facility].  Our hope is that these requests will decrease the amount of time that you wait before being seen by our physicians.       _____________________________________________________________  Should you have questions after your visit to [facility], please contact our office at [phone] between the hours of 8:30 a.m. and 4:30 p.m.  Voicemails left after 4:30 p.m. will not be returned until the following business day.  For prescription refill requests, have your pharmacy contact our office.       Resources For Cancer Patients and their Caregivers ? American Cancer Society: Can assist with transportation, wigs, general needs, runs Look Good Feel Better.        (947)250-2875 ? Cancer Care: Provides financial assistance, online support groups, medication/co-pay assistance.  1-800-813-HOPE (325) 305-9798) ? [place] Assists [place] cancer patients and their families through emotional , educational and financial support.  2098217195 ? [place] DSS Where to apply for food stamps, Medicaid and utility assistance. 4585372363 ? [employer]: Transportation to medical appointments. (445)832-6931 ? Social Security Administration: May apply for disability if have a Stage IV cancer. (647)610-6077 973-605-7337 ? [employer], Disability and Transit Services: Assists with nutrition, care and transit needs. [place] Support Programs: @10RELATIVEDAYS @ > Cancer Support Group  2nd Tuesday of the month 1pm-2pm, Journey Room  > Creative Journey  3rd Tuesday of the month 1130am-1pm, Journey Room  > Look Good Feel Better  1st Wednesday of the month 10am-12 noon, Journey Room (Call [place] to register [phone])

## 2017-01-06 NOTE — Assessment & Plan Note (Addendum)
Stage IIA (T3N0M0) adenocarcinoma of colon (right-sided) measuring 8.5 cm, grade 2, WITHOUT LVI or PNI, 0/14 lymph nodes with disease, negative margins, and MSI-Stable and preservation of expression of major and minor MMR proteins.  After review of risk factors (No LVI, no PNI, moderately differentiated, 14 negative lymph nodes, and T3 tumor, BUT with obstruction and high pre-operative CEA), good prognostic indicators, and low benefit to survival with adjuvant chemotherapy, patient opted to not pursue treatment.  Patients without high-risk features who have MSI-unstable (MSI-H)/dMMR tumors have a favorable prognosis and are not likely to derive significant benefit from adjuvant fluoropyrimidine-based therapy.   Oncology history updated.  I personally reviewed and went over radiographic studies with the patient.  The results are noted within this dictation.  CT scan was moved up due to increase in CEA marker.  CT scans demonstrate a new 2.4 left liver lesion concerning for metastatic disease and an irregular soft tissue in the cul-de-sac involving the anterior wall of rectum and posterior cervix, also suspicious for metastatic disease (drop lesion).  She is up-to-date on colonoscopy with last one being on 11/05/2016 by Dr. Laural Golden.  I have sent a message to Dr. Geroge Baseman about a biopsy of liver lesion.  We will need to perform molecular testing on specimen (KRAS, NRAS, BRAF).  We may need to pursue FoundationONE testing.  Dr. Geroge Baseman responded to my message and recommended ultrasound-guided biopsy of liver lesion.   Orders placed for ultrasound-guided biopsy.  This will be completed by interventional radiology.  Return in 2-3 weeks for follow-up (based upon her biopsy schedule).  Once biopsy is confirmatory for malignancy, she will need complete staging with CT of chest.  This will be ordered and scheduled at follow-up appointment.

## 2017-01-06 NOTE — Progress Notes (Signed)
Janet Hanson, [place] [id number]  Cancer of right colon [facility]) - Plan: US Biopsy  CURRENT THERAPY: Surveillance per NCCN guidelines  INTERVAL HISTORY: Janet Hanson 71 y.o. female returns for followup of Stage IIA (T3N0M0) adenocarcinoma of colon (right-sided) measuring 8.5 cm, grade 2, WITHOUT LVI or PNI, 0/14 lymph nodes with disease, negative margins, and MSI-Stable and preservation of expression of major and minor MMR proteins.  After review of risk factors (No LVI, no PNI, moderately differentiated, 14 negative lymph nodes, and T3 tumor, BUT with obstruction and high pre-operative CEA), good prognostic indicators, and low benefit to survival with adjuvant chemotherapy, patient opted to not pursue treatment.  Patients without high-risk features who have MSI-unstable (MSI-H)/dMMR tumors have a favorable prognosis and are not likely to derive significant benefit from adjuvant fluoropyrimidine-based therapy.  AND Iron deficiency anemia, secondary to chronic GI blood loss from colon cancer.  PO iron discontinued in December 2017 at patient's request due to increased anal flatulence, otherwise well tolerated.  Now needing IV iron replacement therapy periodically. AND B12 deficiency with negative intrinsic factor and anti-parietal cell antibody testing on 08/19/2016.    Cancer of right colon ([name])   06/24/2016 Imaging    CT C/A/P There is a large annular constricting mass involving the mid aspect of the ascending colon most compatible with colonic carcinoma. There is a small adjacent foci of gas and free fluid raising the possibility of micro perforation. There is marked dilatation of the cecum and small bowel with fluid and gas most compatible with proximal obstruction caused by the large ascending colonic mass. Additionally there is a 6 cm fatty mass involving the colon at the level the hepatic flexure, most suggestive of a  lipoma. Aortic vascular calcifications. Sigmoid colonic diverticulosis without evidence for acute diverticulitis.No specific findings identified to suggest metastatic disease to the chest.There is a 3 mm nodule identified within the left upper lobe, nonspecific.Bilateral pleural effusions with overlying compressive type atelectasis and consolidation       06/26/2016 Tumor Marker    CEA 39.3 ng/ml      06/27/2016 Surgery    Partial colectomy with primary linear stapled anastomosis.  Large bulky mid-ascending colonic mass extending into the colonic mesentery and retroperitoneum with a layer of not visibly/palpably involved fat between the colon and underlying visualized and exposed duodenum and Right kidney      06/27/2016 Pathology Results    COLONIC ADENOCARCINOMA (8.5 CM), GRADE 2 THE TUMOR INVADES THROUGH THE MUSCULARIS PROPRIA INTO PERICOLONIC TISSUE  ALL MARGINS OF RESECTION ARE NEGATIVE FOR CARCINOMA FOURTEEN BENIGN LYMPH NODES (0/14) TUBULAR ADENOMA (X3) UNREMARKABLE APPENDIX      06/30/2016 Imaging    CT angio chest Positive examination for right upper lobe and right lower lobe pulmonary emboli. No evidence of right heart strain. Again demonstrated are bilateral pleural effusions and basilar atelectasis, greater on the right.      07/30/2016 Tumor Marker      Ref. Range 07/30/2016 16:46  CEA Latest Ref Range: 0.0 - 4.7 ng/mL 4.0        07/30/2016 Miscellaneous    Medical Oncology consultation.  No adjuvant chemotherapy needed based upon risk factors and small benefit.  Informed decision made by patient to not pursue systemic chemotherapy in adjuvant setting.      11/05/2016 Procedure    Colonoscopy by Dr. Laural Golden.  soft anal tags found on digital rectal exam. - Patent end-to-side  colo-colonic anastomosis, characterized by healthy appearing mucosa. - Three 5 to 7 mm polyps in the proximal transverse colon, removed with a cold snare. Resected and retrieved. - One 10 mm  polyp in the proximal transverse colon, removed with a hot snare. Resected and retrieved. - One 20 mm polyp in the proximal sigmoid colon, removed with a hot snare. Resected and retrieved. Clip (MR conditional) was placed. - One 15 to 25 mm polyp at the recto-sigmoid colon, removed with a hot snare. Resected and retrieved. Clip (MR conditional) was placed. - Diverticulosis in the sigmoid colon and in the descending colon. - The distal rectum and anal verge are normal on retroflexion view.      11/07/2016 Pathology Results    1. Colon, polyp(s), transverse proximal - TUBULAR ADENOMA(S). - HIGH GRADE DYSPLASIA IS NOT IDENTIFIED. 2. Colon, polyp(s), proximal sigmoid - TUBULOVILLOUS ADENOMA WITH FOCAL HIGH GRADE DYSPLASIA (5%). 3. Rectosigmoid , polyp - TUBULOVILLOUS ADENOMA WITH HIGH GRADE DYSPLASIA (15%).      12/26/2016 Imaging    CT abd/pelvis-1. Interval development of a 2.4 cm rim enhancing lesion in the lateral segment left liver, highly suspicious for metastatic disease. 2. New abnormal irregular soft tissue in the cul-de-sac, involving the anterior wall of the rectum and posterior cervix. This is highly suspicious for metastatic involvement (drop lesion). 3. Interval Right hemicolectomy. 4.  Abdominal Aortic Atherosclerois (ICD10-170.0)       Patient is seen sooner than planned due to her recent imaging results.  She denies any complaints.  She denies any new abdominal pain.  She denies any change in her bowels.  She denies any change in her stools.  She denies any blood in her stools or dark stools.  Her appetite is stable and weight is stable.  She denies any oncology related complaints.  Review of Systems  Constitutional: Negative.  Negative for chills, fever and weight loss.  HENT: Negative.   Eyes: Negative.   Respiratory: Negative.  Negative for cough.   Cardiovascular: Negative.  Negative for chest pain.  Gastrointestinal: Negative.  Negative for blood in stool,  constipation, diarrhea, melena, nausea and vomiting.  Genitourinary: Negative.   Musculoskeletal: Negative.   Skin: Negative.   Neurological: Negative.  Negative for weakness.  Endo/Heme/Allergies: Negative.   Psychiatric/Behavioral: Negative.     Past Medical History:  Diagnosis Date  . Cancer of right colon ([place]) 07/30/2016  . Colon cancer ([place])   . Hypercholesterolemia   . Hypertension   . Iron deficiency anemia due to chronic blood loss 07/30/2016  . Vitamin B12 deficiency 08/13/2016    Past Surgical History:  Procedure Laterality Date  . COLONOSCOPY N/A 11/05/2016   Procedure: COLONOSCOPY;  Surgeon: Rogene Houston, MD;  Location: [facility] ENDO SUITE;  Service: Endoscopy;  Laterality: N/A;  1:20  . PARTIAL COLECTOMY N/A 06/27/2016   Procedure: PARTIAL COLECTOMY;  Surgeon: Vickie Epley, MD;  Location: [facility] ORS;  Service: General;  Laterality: N/A;    Family History  Problem Relation Age of Onset  . Hypertension Mother   . Other Father     some type of "stomach ailment"  . Suicidality Brother   . Stomach cancer Maternal Aunt   . Brain cancer Brother     Social History   Social History  . Marital status: Widowed    Spouse name: N/A  . Number of children: N/A  . Years of education: N/A   Social History Main Topics  . Smoking status: Former Smoker  Packs/day: 0.50    Years: 48.00    Types: Cigarettes    Quit date: 06/24/2016  . Smokeless tobacco: Never Used     Comment: quit smoking Sept 2017  . Alcohol use No     Comment: glass wine  twice per year  . Drug use: No  . Sexual activity: Not Asked     Comment: widowed- 1 daughter   Other Topics Concern  . None   Social History Narrative  . None     PHYSICAL EXAMINATION  ECOG PERFORMANCE STATUS: 1 - Symptomatic but completely ambulatory  Vitals:   01/06/17 1059  BP: 124/85  Pulse: (!) 119  Resp: 18  Temp: 98.3 F (36.8 C)    GENERAL:alert, no distress, well nourished, well developed, comfortable,  cooperative, obese, smiling and accompanied SKIN: skin color, texture, turgor are normal, no rashes or significant lesions HEAD: Normocephalic, No masses, lesions, tenderness or abnormalities EYES: normal, EOMI, Conjunctiva are pink and non-injected EARS: External ears normal OROPHARYNX:lips, buccal mucosa, and tongue normal and mucous membranes are moist  NECK: supple, trachea midline LYMPH:  no palpable lymphadenopathy BREAST:not examined LUNGS: not examined  HEART: not examined ABDOMEN: obese BACK: Back symmetric, no curvature. EXTREMITIES:less then 2 second capillary refill, no joint deformities, effusion, or inflammation, no skin discoloration, no cyanosis  NEURO: alert & oriented x 3 with fluent speech, no focal motor/sensory deficits, gait normal  LABORATORY DATA: CBC    Component Value Date/Time   WBC 9.3 12/10/2016 1451   RBC 4.39 12/10/2016 1451   HGB 11.9 (L) 12/10/2016 1451   HCT 39.2 12/10/2016 1451   PLT 280 12/10/2016 1451   MCV 89.3 12/10/2016 1451   MCH 27.1 12/10/2016 1451   MCHC 30.4 12/10/2016 1451   RDW 14.6 12/10/2016 1451   LYMPHSABS 2.7 12/10/2016 1451   MONOABS 0.9 12/10/2016 1451   EOSABS 0.2 12/10/2016 1451   BASOSABS 0.0 12/10/2016 1451      Chemistry      Component Value Date/Time   NA 136 12/10/2016 1451   K 4.3 12/10/2016 1451   CL 99 (L) 12/10/2016 1451   CO2 29 12/10/2016 1451   BUN 15 12/10/2016 1451   CREATININE 0.67 12/10/2016 1451      Component Value Date/Time   CALCIUM 8.8 (L) 12/10/2016 1451   ALKPHOS 53 12/10/2016 1451   AST 30 12/10/2016 1451   ALT 29 12/10/2016 1451   BILITOT 0.6 12/10/2016 1451     Lab Results  Component Value Date   IRON 44 12/10/2016   TIBC 486 (H) 12/10/2016   FERRITIN 18 12/10/2016   Lab Results  Component Value Date   ZSWFUXNA35 573 07/30/2016   Lab Results  Component Value Date   CEA 8.4 (H) 12/10/2016    PENDING LABS:   RADIOGRAPHIC STUDIES:  Ct Abdomen Pelvis W  Contrast  Result Date: 12/26/2016 CLINICAL DATA:  Stage II colon cancer. EXAM: CT ABDOMEN AND PELVIS WITH CONTRAST TECHNIQUE: Multidetector CT imaging of the abdomen and pelvis was performed using the standard protocol following bolus administration of intravenous contrast. CONTRAST:  136m ISOVUE-300 IOPAMIDOL (ISOVUE-300) INJECTION 61% COMPARISON:  06/24/2016 FINDINGS: Lower chest: 6 mm short axis paraesophageal lymph node seen on image 14 is unchanged in the interval. Hepatobiliary: There is a new 2.4 cm peripherally enhancing lesion identified in the lateral segment left liver (image 24 series 2 no other definite liver lesion is evident. There is no evidence for gallstones, gallbladder wall thickening, or pericholecystic fluid. No intrahepatic  or extrahepatic biliary dilation. Pancreas: No focal mass lesion. No dilatation of the main duct. No intraparenchymal cyst. No peripancreatic edema. Spleen: No splenomegaly. No focal mass lesion. Adrenals/Urinary Tract: No adrenal nodule or mass. Tiny hypoattenuating cortical lesions right kidney are stable and likely tiny cyst. Left kidney unremarkable. No evidence for hydroureter. The urinary bladder appears normal for the degree of distention. Stomach/Bowel: Stomach is nondistended. No gastric wall thickening. No evidence of outlet obstruction. Duodenum is normally positioned as is the ligament of Treitz. No small bowel wall thickening. No small bowel dilatation. Patient is status post right hemicolectomy. Diverticular changes are noted in the left colon without evidence of diverticulitis. Vascular/Lymphatic: There is abdominal aortic atherosclerosis without aneurysm. There is no gastrohepatic or hepatoduodenal ligament lymphadenopathy. No intraperitoneal or retroperitoneal lymphadenopathy. No pelvic sidewall lymphadenopathy. Reproductive: The uterus has normal CT imaging appearance. There is no adnexal mass. Other: No intraperitoneal free fluid. Abnormal soft tissue  is identified in the cul-de-sac, involving the anterior wall of the distal rectum and posterior cervix (see image 75 series 2). Musculoskeletal: Bone windows reveal no worrisome lytic or sclerotic osseous lesions. IMPRESSION: 1. Interval development of a 2.4 cm rim enhancing lesion in the lateral segment left liver, highly suspicious for metastatic disease. 2. New abnormal irregular soft tissue in the cul-de-sac, involving the anterior wall of the rectum and posterior cervix. This is highly suspicious for metastatic involvement (drop lesion). 3. Interval Right hemicolectomy. 4.  Abdominal Aortic Atherosclerois (ICD10-170.0) These results will be called to the ordering clinician or representative by the Radiologist Assistant, and communication documented in the PACS or zVision Dashboard. Electronically Signed   By: Misty Stanley M.D.   On: 12/26/2016 13:55     PATHOLOGY:    ASSESSMENT AND PLAN:  Cancer of right colon ([place]) Stage IIA (T3N0M0) adenocarcinoma of colon (right-sided) measuring 8.5 cm, grade 2, WITHOUT LVI or PNI, 0/14 lymph nodes with disease, negative margins, and MSI-Stable and preservation of expression of major and minor MMR proteins.  After review of risk factors (No LVI, no PNI, moderately differentiated, 14 negative lymph nodes, and T3 tumor, BUT with obstruction and high pre-operative CEA), good prognostic indicators, and low benefit to survival with adjuvant chemotherapy, patient opted to not pursue treatment.  Patients without high-risk features who have MSI-unstable (MSI-H)/dMMR tumors have a favorable prognosis and are not likely to derive significant benefit from adjuvant fluoropyrimidine-based therapy.   Oncology history updated.  I personally reviewed and went over radiographic studies with the patient.  The results are noted within this dictation.  CT scan was moved up due to increase in CEA marker.  CT scans demonstrate a new 2.4 left liver lesion concerning for metastatic  disease and an irregular soft tissue in the cul-de-sac involving the anterior wall of rectum and posterior cervix, also suspicious for metastatic disease (drop lesion).  She is up-to-date on colonoscopy with last one being on 11/05/2016 by Dr. Laural Golden.  I have sent a message to Dr. Geroge Baseman about a biopsy of liver lesion.  We will need to perform molecular testing on specimen (KRAS, NRAS, BRAF).  We may need to pursue FoundationONE testing.  Dr. Geroge Baseman responded to my message and recommended ultrasound-guided biopsy of liver lesion.   Orders placed for ultrasound-guided biopsy.  This will be completed by interventional radiology.  Return in 2-3 weeks for follow-up (based upon her biopsy schedule).  Once biopsy is confirmatory for malignancy, she will need complete staging with CT of chest.  This will be  ordered and scheduled at follow-up appointment.   ORDERS PLACED FOR THIS ENCOUNTER: Orders Placed This Encounter  Procedures  . US Biopsy    MEDICATIONS PRESCRIBED THIS ENCOUNTER: No orders of the defined types were placed in this encounter.   THERAPY PLAN:  NCCN guidelines for surveillance for Colon cancer are as follows (1.2017):  A. Stage I   1. Colonoscopy at year 1    A. If advanced adenoma, repeat in 1 year    B. If no advanced adenoma, repeat in 3 years, and then every 5 years.  B. Stage II, Stage III   1. H+P every 3-6 months x 2 years and then every 6 months for a total of 5 years    2. CEA every 3-6 months x 2 years and then every 6 months for a total of 5 years    3. CT CAP every 6-12 months (category 2B for frequency < 12 months) for a total of 5 years .   4.  Colonoscopy in 1 year except if no preoperative colonoscopy due to obstructing lesion, colonoscopy in 3-6 months.     A. If advanced adenoma, repeat in 1 year    B. If no advanced adenoma, repeat in 3 years, then every 5 years   5. PET/CT scan is not recommended.  C. Stage IV   1. H+P every 3-6 months x 2  years and then every 6 months for a total of 5 years    2. CEA every 3 months x 2 years and then every 6 months for a total of 3- 5 years    3. CT CAP every 3-6 months (category 2B for frequency < 6 months) x 2 years., then every 6-12 months for a total of 5 years .   4. Colonoscopy in 1 year except if no preoperative colonoscopy due to obstructing lesion, colonoscopy in 3-6 months.     A. If advanced adenoma, repeat in 1 year    B. If no advanced adenoma, repeat in 3 years, then every 5 years   All questions were answered. The patient knows to call the clinic with any problems, questions or concerns. We can certainly see the patient much sooner if necessary.  Patient and plan discussed with Dr. Twana First and she is in agreement with the aforementioned.   This note is electronically signed by: Doy Mince 01/06/2017 1:02 PM

## 2017-01-09 ENCOUNTER — Telehealth (HOSPITAL_COMMUNITY): Payer: Self-pay

## 2017-01-09 NOTE — Telephone Encounter (Signed)
Received message from Vivien Rota in scheduling [phone]) wanting to know when patient needs to stop her coumadin in preparation for her biopsy. She had sent a message to PA-C but had not received a response. Reviewed with Dr. Talbert Cage, who said to stop coumadin 5 days before her procedure and get INR checked 1 day before biopsy. Called Vivien Rota back and left message with this information and for her to call if any questions.

## 2017-01-15 ENCOUNTER — Other Ambulatory Visit: Payer: Self-pay | Admitting: General Surgery

## 2017-01-15 ENCOUNTER — Other Ambulatory Visit: Payer: Self-pay | Admitting: Radiology

## 2017-01-16 ENCOUNTER — Ambulatory Visit (HOSPITAL_COMMUNITY)
Admission: RE | Admit: 2017-01-16 | Discharge: 2017-01-16 | Disposition: A | Discharge status: Home / Self Care | Payer: Medicare Other | Source: Ambulatory Visit | Attending: Oncology | Admitting: Oncology

## 2017-01-16 DIAGNOSIS — C787 Secondary malignant neoplasm of liver and intrahepatic bile duct: Secondary | ICD-10-CM | POA: Diagnosis not present

## 2017-01-16 DIAGNOSIS — Z7901 Long term (current) use of anticoagulants: Secondary | ICD-10-CM | POA: Insufficient documentation

## 2017-01-16 DIAGNOSIS — I10 Essential (primary) hypertension: Secondary | ICD-10-CM | POA: Diagnosis not present

## 2017-01-16 DIAGNOSIS — Z87891 Personal history of nicotine dependence: Secondary | ICD-10-CM | POA: Diagnosis not present

## 2017-01-16 DIAGNOSIS — C182 Malignant neoplasm of ascending colon: Secondary | ICD-10-CM | POA: Diagnosis present

## 2017-01-16 DIAGNOSIS — Z79899 Other long term (current) drug therapy: Secondary | ICD-10-CM | POA: Diagnosis not present

## 2017-01-16 LAB — CBC
HCT: 38.2 % (ref 36.0–46.0)
HEMOGLOBIN: 12.1 g/dL (ref 12.0–15.0)
MCH: 28.1 pg (ref 26.0–34.0)
MCHC: 31.7 g/dL (ref 30.0–36.0)
MCV: 88.8 fL (ref 78.0–100.0)
Platelets: 274 10*3/uL (ref 150–400)
RBC: 4.3 MIL/uL (ref 3.87–5.11)
RDW: 15.5 % (ref 11.5–15.5)
WBC: 8.8 10*3/uL (ref 4.0–10.5)

## 2017-01-16 LAB — PROTIME-INR
INR: 1.24
PROTHROMBIN TIME: 15.6 s — AB (ref 11.4–15.2)

## 2017-01-16 LAB — APTT: aPTT: 29 seconds (ref 24–36)

## 2017-01-16 MED ORDER — FENTANYL CITRATE (PF) 100 MCG/2ML IJ SOLN
INTRAMUSCULAR | Status: AC | PRN
  Administered 2017-01-16 (×2): 50 ug via INTRAVENOUS

## 2017-01-16 MED ORDER — GELATIN ABSORBABLE 12-7 MM EX MISC
CUTANEOUS | Status: AC
  Filled 2017-01-16: qty 1

## 2017-01-16 MED ORDER — LIDOCAINE HCL 1 % IJ SOLN
INTRAMUSCULAR | Status: AC
  Filled 2017-01-16: qty 20

## 2017-01-16 MED ORDER — SODIUM CHLORIDE 0.9 % IV SOLN
INTRAVENOUS | Status: DC
  Administered 2017-01-16: 12:00:00 via INTRAVENOUS

## 2017-01-16 MED ORDER — MIDAZOLAM HCL 2 MG/2ML IJ SOLN
INTRAMUSCULAR | Status: AC | PRN
  Administered 2017-01-16 (×2): 1 mg via INTRAVENOUS

## 2017-01-16 MED ORDER — FENTANYL CITRATE (PF) 100 MCG/2ML IJ SOLN
INTRAMUSCULAR | Status: AC
  Filled 2017-01-16: qty 2

## 2017-01-16 MED ORDER — MIDAZOLAM HCL 2 MG/2ML IJ SOLN
INTRAMUSCULAR | Status: AC
  Filled 2017-01-16: qty 2

## 2017-01-16 NOTE — Procedures (Signed)
Interventional Radiology Procedure Note  Procedure: US guided liver bx.  18G cores.   Complications: None  Estimated Blood Loss: None  Recommendations: - Bedrest x 3 hrs - Path is sent - DC home  Signed,  Criselda Peaches, MD

## 2017-01-16 NOTE — H&P (Signed)
Chief Complaint: Patient was seen in consultation today for liver lesion  Referring Physician(s): Dr. Macario Carls  Supervising Physician: Jacqulynn Cadet  Patient Status: [facility] - Out-pt  History of Present Illness: Janet Hanson is a 71 y.o. female with history of HLD, HTN, IDA, and colon cancer who presents with complaint of liver lesion found on recent CT scan.   CT Abd/Pelvis: 1. Interval development of a 2.4 cm rim enhancing lesion in the lateral segment left liver, highly suspicious for metastatic disease. 2. New abnormal irregular soft tissue in the cul-de-sac, involving the anterior wall of the rectum and posterior cervix. This is highly suspicious for metastatic involvement (drop lesion). 3. Interval Right hemicolectomy.  IR consulted for liver lesion biopsy at the request of Hassell Halim, PA-C.  Case reviewed by Dr. Laurence Ferrari who approves patient for procedure.   She has been NPO. She has held her coumadin since Sunday.  She has been in her usual state of health.    Past Medical History:  Diagnosis Date  . Cancer of right colon ([place]) 07/30/2016  . Colon cancer ([place])   . Hypercholesterolemia   . Hypertension   . Iron deficiency anemia due to chronic blood loss 07/30/2016  . Vitamin B12 deficiency 08/13/2016    Past Surgical History:  Procedure Laterality Date  . COLONOSCOPY N/A 11/05/2016   Procedure: COLONOSCOPY;  Surgeon: Rogene Houston, MD;  Location: [facility] ENDO SUITE;  Service: Endoscopy;  Laterality: N/A;  1:20  . PARTIAL COLECTOMY N/A 06/27/2016   Procedure: PARTIAL COLECTOMY;  Surgeon: Vickie Epley, MD;  Location: [facility] ORS;  Service: General;  Laterality: N/A;    Allergies: Patient has no known allergies.  Medications: Prior to Admission medications   Medication Sig Start Date End Date Taking? Authorizing Provider  acetaminophen (TYLENOL) 325 MG tablet Take 325 mg by mouth every 6 (six) hours as needed for mild pain or moderate pain.    Yes Historical Provider, MD  amLODipine (NORVASC) 5 MG tablet Take 5 mg by mouth daily.   Yes Historical Provider, MD  Cyanocobalamin (B-12) 5000 MCG SUBL Place 1 tablet under the tongue daily. 10/29/16  Yes Manon Hilding Kefalas, PA-C  escitalopram (LEXAPRO) 10 MG tablet Take 10 mg by mouth daily.   Yes Historical Provider, MD  iron polysaccharides (NIFEREX) 150 MG capsule Take 1 capsule (150 mg total) by mouth daily. 08/14/16  Yes Patrici Ranks, MD  Omega-3 Fatty Acids (FISH OIL) 1200 MG CAPS Take 1 capsule by mouth daily.   Yes Historical Provider, MD  simvastatin (ZOCOR) 40 MG tablet Take 40 mg by mouth daily.   Yes Historical Provider, MD  warfarin (COUMADIN) 5 MG tablet Take 5 mg by mouth daily at 6 PM. 2.5 mg Monday and Thursday. 5 mg all other days 11/06/16  Yes Rogene Houston, MD     Family History  Problem Relation Age of Onset  . Hypertension Mother   . Other Father     some type of "stomach ailment"  . Suicidality Brother   . Stomach cancer Maternal Aunt   . Brain cancer Brother     Social History   Social History  . Marital status: Widowed    Spouse name: N/A  . Number of children: N/A  . Years of education: N/A   Social History Main Topics  . Smoking status: Former Smoker    Packs/day: 0.50    Years: 48.00    Types: Cigarettes    Quit  date: 06/24/2016  . Smokeless tobacco: Never Used     Comment: quit smoking Sept 2017  . Alcohol use No     Comment: glass wine  twice per year  . Drug use: No  . Sexual activity: Not on file     Comment: widowed- 1 daughter   Other Topics Concern  . Not on file   Social History Narrative  . No narrative on file     Review of Systems  Constitutional: Negative for fatigue and fever.  Respiratory: Negative for cough and shortness of breath.   Cardiovascular: Negative for chest pain.  Gastrointestinal: Negative for abdominal pain.  Psychiatric/Behavioral: Negative for behavioral problems and confusion.    Vital  Signs: BP (!) 141/91   Pulse (!) 120   Temp 98.7 F (37.1 C)   Resp 18   Ht 5\' 2"  (1.575 m)   Wt 198 lb (89.8 kg)   SpO2 95%   BMI 36.21 kg/m   Physical Exam  Constitutional: She is oriented to person, place, and time. She appears well-developed.  Cardiovascular: Normal rate, regular rhythm and normal heart sounds.   Pulmonary/Chest: Effort normal and breath sounds normal. No respiratory distress.  Abdominal: Soft.  Neurological: She is alert and oriented to person, place, and time.  Skin: Skin is warm and dry.  Psychiatric: She has a normal mood and affect. Her behavior is normal. Judgment and thought content normal.  Nursing note and vitals reviewed.   Mallampati Score:  MD Evaluation Airway: WNL Heart: WNL Abdomen: WNL Chest/ Lungs: WNL ASA  Classification: 3 Mallampati/Airway Score: Two  Imaging: Ct Abdomen Pelvis W Contrast  Result Date: 12/26/2016 CLINICAL DATA:  Stage II colon cancer. EXAM: CT ABDOMEN AND PELVIS WITH CONTRAST TECHNIQUE: Multidetector CT imaging of the abdomen and pelvis was performed using the standard protocol following bolus administration of intravenous contrast. CONTRAST:  139mL ISOVUE-300 IOPAMIDOL (ISOVUE-300) INJECTION 61% COMPARISON:  06/24/2016 FINDINGS: Lower chest: 6 mm short axis paraesophageal lymph node seen on image 14 is unchanged in the interval. Hepatobiliary: There is a new 2.4 cm peripherally enhancing lesion identified in the lateral segment left liver (image 24 series 2 no other definite liver lesion is evident. There is no evidence for gallstones, gallbladder wall thickening, or pericholecystic fluid. No intrahepatic or extrahepatic biliary dilation. Pancreas: No focal mass lesion. No dilatation of the main duct. No intraparenchymal cyst. No peripancreatic edema. Spleen: No splenomegaly. No focal mass lesion. Adrenals/Urinary Tract: No adrenal nodule or mass. Tiny hypoattenuating cortical lesions right kidney are stable and likely  tiny cyst. Left kidney unremarkable. No evidence for hydroureter. The urinary bladder appears normal for the degree of distention. Stomach/Bowel: Stomach is nondistended. No gastric wall thickening. No evidence of outlet obstruction. Duodenum is normally positioned as is the ligament of Treitz. No small bowel wall thickening. No small bowel dilatation. Patient is status post right hemicolectomy. Diverticular changes are noted in the left colon without evidence of diverticulitis. Vascular/Lymphatic: There is abdominal aortic atherosclerosis without aneurysm. There is no gastrohepatic or hepatoduodenal ligament lymphadenopathy. No intraperitoneal or retroperitoneal lymphadenopathy. No pelvic sidewall lymphadenopathy. Reproductive: The uterus has normal CT imaging appearance. There is no adnexal mass. Other: No intraperitoneal free fluid. Abnormal soft tissue is identified in the cul-de-sac, involving the anterior wall of the distal rectum and posterior cervix (see image 75 series 2). Musculoskeletal: Bone windows reveal no worrisome lytic or sclerotic osseous lesions. IMPRESSION: 1. Interval development of a 2.4 cm rim enhancing lesion in the lateral segment  left liver, highly suspicious for metastatic disease. 2. New abnormal irregular soft tissue in the cul-de-sac, involving the anterior wall of the rectum and posterior cervix. This is highly suspicious for metastatic involvement (drop lesion). 3. Interval Right hemicolectomy. 4.  Abdominal Aortic Atherosclerois (ICD10-170.0) These results will be called to the ordering clinician or representative by the Radiologist Assistant, and communication documented in the PACS or zVision Dashboard. Electronically Signed   By: Misty Stanley M.D.   On: 12/26/2016 13:55    Labs:  CBC:  Recent Labs  07/03/16 0626 07/30/16 1646 10/29/16 1230 12/10/16 1451  WBC 8.5 9.0 8.9 9.3  HGB 9.7* 10.6* 11.6* 11.9*  HCT 30.1* 34.5* 37.3 39.2  PLT 375 367 307 280     COAGS: No results for input(s): INR, APTT in the last 8760 hours.  BMP:  Recent Labs  07/02/16 0558 07/30/16 1646 10/29/16 1230 12/10/16 1451  NA 135 135 134* 136  K 3.7 3.9 4.4 4.3  CL 101 101 98* 99*  CO2 28 26 26 29   GLUCOSE 100* 119* 132* 167*  BUN 5* 15 18 15   CALCIUM 7.9* 8.9 9.1 8.8*  CREATININE 0.57 0.91 0.67 0.67  GFRNONAA >60 >60 >60 >60  GFRAA >60 >60 >60 >60    LIVER FUNCTION TESTS:  Recent Labs  06/24/16 1543 07/30/16 1646 10/29/16 1230 12/10/16 1451  BILITOT 0.9 0.6 0.5 0.6  AST 14* 23 23 30   ALT 10* 21 23 29   ALKPHOS 46 55 54 53  PROT 7.0 7.7 7.5 8.0  ALBUMIN 3.7 3.9 3.8 4.1    TUMOR MARKERS:  Recent Labs  06/26/16 1011 07/30/16 1646 10/29/16 1230 12/10/16 1451  CEA 39.3* 4.0 4.8* 8.4*    Assessment and Plan: Patient with history of colon cancer found to have enhancing liver lesion on recent CT Abd/Pelvis.  IR consulted for liver lesion biopsy at the request of Hassell Halim, PA-C.  Patient presents for procedure today.  She has been NPO.  She appropriately held her coumadin since Sunday.  She has been in her usual state of health. Risks and Benefits discussed with the patient including, but not limited to bleeding, infection, damage to adjacent structures or low yield requiring additional tests. All of the patient's questions were answered, patient is agreeable to proceed. Consent signed and in chart.  Thank you for this interesting consult.  I greatly enjoyed meeting Janet Hanson and look forward to participating in their care.  A copy of this report was sent to the requesting provider on this date.  Electronically Signed: Docia Barrier 01/16/2017, 11:27 AM   I spent a total of  30 Minutes   in face to face in clinical consultation, greater than 50% of which was counseling/coordinating care for liver lesion.

## 2017-01-16 NOTE — Discharge Instructions (Signed)
Liver Biopsy, Care After  These instructions give you information on caring for yourself after your procedure. Your doctor may also give you more specific instructions. Call your doctor if you have any problems or questions after your procedure.  Follow these instructions at home:  · Rest at home for 1-2 days or as told by your doctor.  · Have someone stay with you for at least 24 hours.  · Do not do these things in the first 24 hours:  ? Drive.  ? Use machinery.  ? Take care of other people.  ? Sign legal documents.  ? Take a bath or shower.  · There are many different ways to close and cover a cut (incision). For example, a cut can be closed with stitches, skin glue, or adhesive strips. Follow your doctor's instructions on:  ? Taking care of your cut.  ? Changing and removing your bandage (dressing).  ? Removing whatever was used to close your cut.  · Do not drink alcohol in the first week.  · Do not lift more than 5 pounds or play contact sports for the first 2 weeks.  · Take medicines only as told by your doctor. For 1 week, do not take medicine that has aspirin in it or medicines like ibuprofen.  · Get your test results.  Contact a doctor if:  · A cut bleeds and leaves more than just a small spot of blood.  · A cut is red, puffs up (swells), or hurts more than before.  · Fluid or something else comes from a cut.  · A cut smells bad.  · You have a fever or chills.  Get help right away if:  · You have swelling, bloating, or pain in your belly (abdomen).  · You get dizzy or faint.  · You have a rash.  · You feel sick to your stomach (nauseous) or throw up (vomit).  · You have trouble breathing, feel short of breath, or feel faint.  · Your chest hurts.  · You have problems talking or seeing.  · You have trouble balancing or moving your arms or legs.  This information is not intended to replace advice given to you by your health care provider. Make sure you discuss any questions you have with your health care  provider.  Document Released: 07/29/2008 Document Revised: 03/27/2016 Document Reviewed: 12/16/2013  [employer] Interactive Patient Education © 2017 [employer]

## 2017-01-21 ENCOUNTER — Encounter (HOSPITAL_BASED_OUTPATIENT_CLINIC_OR_DEPARTMENT_OTHER): Payer: Medicare Other | Admitting: Hematology

## 2017-01-21 ENCOUNTER — Encounter (HOSPITAL_COMMUNITY): Payer: Self-pay | Admitting: Hematology

## 2017-01-21 VITALS — BP 152/84 | HR 121 | Temp 98.5°F | Resp 18 | Wt 198.3 lb

## 2017-01-21 DIAGNOSIS — C787 Secondary malignant neoplasm of liver and intrahepatic bile duct: Secondary | ICD-10-CM | POA: Diagnosis not present

## 2017-01-21 DIAGNOSIS — D500 Iron deficiency anemia secondary to blood loss (chronic): Secondary | ICD-10-CM

## 2017-01-21 DIAGNOSIS — E538 Deficiency of other specified B group vitamins: Secondary | ICD-10-CM | POA: Diagnosis not present

## 2017-01-21 DIAGNOSIS — I2699 Other pulmonary embolism without acute cor pulmonale: Secondary | ICD-10-CM

## 2017-01-21 DIAGNOSIS — C189 Malignant neoplasm of colon, unspecified: Secondary | ICD-10-CM

## 2017-01-21 DIAGNOSIS — C182 Malignant neoplasm of ascending colon: Secondary | ICD-10-CM

## 2017-01-21 NOTE — Progress Notes (Signed)
Marland Kitchen  HEMATOLOGY ONCOLOGY PROGRESS NOTE  Date of service: .01/21/2017  Patient Care Team: Tempie Hoist, FNP as PCP - General (Family Medicine)  CC: f/u for colon cancer/f/u of liver biopsy results  Diagnosis: Metastatic colon cancer with liver metastasis and like with drop metastases in the cul-de-sac   Current Treatment: planning to do FOLFOX and consider liver lesion ablation  SUMMARY OF ONCOLOGIC HISTORY:   Cancer of right colon ([place])   06/24/2016 Imaging    CT C/A/P There is a large annular constricting mass involving the mid aspect of the ascending colon most compatible with colonic carcinoma. There is a small adjacent foci of gas and free fluid raising the possibility of micro perforation. There is marked dilatation of the cecum and small bowel with fluid and gas most compatible with proximal obstruction caused by the large ascending colonic mass. Additionally there is a 6 cm fatty mass involving the colon at the level the hepatic flexure, most suggestive of a lipoma. Aortic vascular calcifications. Sigmoid colonic diverticulosis without evidence for acute diverticulitis.No specific findings identified to suggest metastatic disease to the chest.There is a 3 mm nodule identified within the left upper lobe, nonspecific.Bilateral pleural effusions with overlying compressive type atelectasis and consolidation       06/26/2016 Tumor Marker    CEA 39.3 ng/ml      06/27/2016 Surgery    Partial colectomy with primary linear stapled anastomosis.  Large bulky mid-ascending colonic mass extending into the colonic mesentery and retroperitoneum with a layer of not visibly/palpably involved fat between the colon and underlying visualized and exposed duodenum and Right kidney      06/27/2016 Pathology Results    COLONIC ADENOCARCINOMA (8.5 CM), GRADE 2 THE TUMOR INVADES THROUGH THE MUSCULARIS PROPRIA INTO PERICOLONIC TISSUE  ALL MARGINS OF RESECTION ARE NEGATIVE FOR  CARCINOMA FOURTEEN BENIGN LYMPH NODES (0/14) TUBULAR ADENOMA (X3) UNREMARKABLE APPENDIX      06/30/2016 Imaging    CT angio chest Positive examination for right upper lobe and right lower lobe pulmonary emboli. No evidence of right heart strain. Again demonstrated are bilateral pleural effusions and basilar atelectasis, greater on the right.      07/30/2016 Tumor Marker      Ref. Range 07/30/2016 16:46  CEA Latest Ref Range: 0.0 - 4.7 ng/mL 4.0        07/30/2016 Miscellaneous    Medical Oncology consultation.  No adjuvant chemotherapy needed based upon risk factors and small benefit.  Informed decision made by patient to not pursue systemic chemotherapy in adjuvant setting.      11/05/2016 Procedure    Colonoscopy by Dr. Laural Golden.  soft anal tags found on digital rectal exam. - Patent end-to-side colo-colonic anastomosis, characterized by healthy appearing mucosa. - Three 5 to 7 mm polyps in the proximal transverse colon, removed with a cold snare. Resected and retrieved. - One 10 mm polyp in the proximal transverse colon, removed with a hot snare. Resected and retrieved. - One 20 mm polyp in the proximal sigmoid colon, removed with a hot snare. Resected and retrieved. Clip (MR conditional) was placed. - One 15 to 25 mm polyp at the recto-sigmoid colon, removed with a hot snare. Resected and retrieved. Clip (MR conditional) was placed. - Diverticulosis in the sigmoid colon and in the descending colon. - The distal rectum and anal verge are normal on retroflexion view.      11/07/2016 Pathology Results    1. Colon, polyp(s), transverse proximal - TUBULAR ADENOMA(S). - HIGH GRADE DYSPLASIA  IS NOT IDENTIFIED. 2. Colon, polyp(s), proximal sigmoid - TUBULOVILLOUS ADENOMA WITH FOCAL HIGH GRADE DYSPLASIA (5%). 3. Rectosigmoid , polyp - TUBULOVILLOUS ADENOMA WITH HIGH GRADE DYSPLASIA (15%).      12/26/2016 Imaging    CT abd/pelvis-1. Interval development of a 2.4 cm rim enhancing  lesion in the lateral segment left liver, highly suspicious for metastatic disease. 2. New abnormal irregular soft tissue in the cul-de-sac, involving the anterior wall of the rectum and posterior cervix. This is highly suspicious for metastatic involvement (drop lesion). 3. Interval Right hemicolectomy. 4.  Abdominal Aortic Atherosclerois (ICD10-170.0)      01/16/2017 Procedure    IR biopsy of hepatic lesion      01/19/2017 Pathology Results    Liver, needle/core biopsy, Left Lobe - ADENOCARCINOMA Microscopic Comment The morphology is consistent with metastatic colorectal adenocarcinoma. There is sufficient tissue for additional studies. Called to Kirby Crigler on 01/19/17       INTERVAL HISTORY:  Patient was diagnosed in August 2017 with Stage IIA (T3N0M0) adenocarcinoma of colon (right-sided) measuring 8.5 cm, grade 2, WITHOUT LVI or PNI, 0/14 lymph nodes with disease, negative margins, and MSI-Stable and preservation of expression of major and minor MMR proteins.  After review of risk factors (No LVI, no PNI, moderately differentiated, 14 negative lymph nodes, and T3 tumor, BUT with obstruction and high pre-operative CEA), good prognostic indicators, and low benefit to survival with adjuvant chemotherapy, patient opted to not pursue treatment.  Imaging studies with CT of the abdomen and pelvis on 12/26/2016 showed interval development of a 2.4 cm liver lesion and concern for drop metastases in the cul-de-sac. Patient has had an ultrasound-guided liver mass biopsy on 01/16/2017 which unfortunately is consistent with metastatic colorectal adenocarcinoma.  We discussed the findings and the fact that this represents an incurable condition do treatment would seek to control the disease for a while. Treatment goals would be palliative.  Patient is agreeable for Port-A-Cath placement .  We discussed treatment options and she is agreeable to proceed with FOLFOX chemotherapy .  She has had  issues with acute PE in August 2017 and therefore we will hold off on the option for Avastin.   REVIEW OF SYSTEMS:    10 Point review of systems of done and is negative except as noted above.  . Past Medical History:  Diagnosis Date  . Cancer of right colon ([place]) 07/30/2016  . Colon cancer ([place])   . Hypercholesterolemia   . Hypertension   . Iron deficiency anemia due to chronic blood loss 07/30/2016  . Vitamin B12 deficiency 08/13/2016   . Patient Active Problem List   Diagnosis Date Noted  . Vitamin B12 deficiency 08/13/2016  . Elevated CEA 07/31/2016  . Cancer of right colon ([place]) 07/30/2016  . Iron deficiency anemia due to chronic blood loss 07/30/2016  . Acute pulmonary embolism ([place]) 07/01/2016  . Colonic mass 06/24/2016  . Anxiety 06/24/2016  . HTN (hypertension) 06/24/2016     . Past Surgical History:  Procedure Laterality Date  . COLONOSCOPY N/A 11/05/2016   Procedure: COLONOSCOPY;  Surgeon: Rogene Houston, MD;  Location: [facility];  Service: Endoscopy;  Laterality: N/A;  1:20  . PARTIAL COLECTOMY N/A 06/27/2016   Procedure: PARTIAL COLECTOMY;  Surgeon: Vickie Epley, MD;  Location: [facility] ORS;  Service: General;  Laterality: N/A;    . Social History  Substance Use Topics  . Smoking status: Former Smoker    Packs/day: 0.50    Years: 48.00  Types: Cigarettes    Quit date: 06/24/2016  . Smokeless tobacco: Never Used     Comment: quit smoking Sept 2017  . Alcohol use No     Comment: glass wine  twice per year    ALLERGIES:  has No Known Allergies.  MEDICATIONS:  Current Outpatient Prescriptions  Medication Sig Dispense Refill  . acetaminophen (TYLENOL) 325 MG tablet Take 325 mg by mouth every 6 (six) hours as needed for mild pain or moderate pain.    Marland Kitchen amLODipine (NORVASC) 5 MG tablet Take 5 mg by mouth daily.    . Cyanocobalamin (B-12) 5000 MCG SUBL Place 1 tablet under the tongue daily. 30 tablet 11  . escitalopram (LEXAPRO) 10 MG tablet Take 10 mg  by mouth daily.    . iron polysaccharides (NIFEREX) 150 MG capsule Take 1 capsule (150 mg total) by mouth daily. 30 capsule 6  . Omega-3 Fatty Acids (FISH OIL) 1200 MG CAPS Take 1 capsule by mouth daily.    . simvastatin (ZOCOR) 40 MG tablet Take 40 mg by mouth daily.    Marland Kitchen warfarin (COUMADIN) 1 MG tablet     . warfarin (COUMADIN) 5 MG tablet Take 5 mg by mouth daily at 6 PM. 2.5 mg Monday and Thursday. 5 mg all other days     No current facility-administered medications for this visit.     PHYSICAL EXAMINATION: ECOG PERFORMANCE STATUS: 2 - Symptomatic, <50% confined to bed  . Vitals:   01/21/17 1105  BP: (!) 152/84  Pulse: (!) 121  Resp: 18  Temp: 98.5 F (36.9 C)    Filed Weights   01/21/17 1105  Weight: 198 lb 4.8 oz (89.9 kg)   .Body mass index is 36.27 kg/m.  GENERAL:alert, in no acute distress and comfortable SKIN: no acute rashes, no significant lesions EYES: conjunctiva are pink and non-injected, sclera anicteric OROPHARYNX: MMM, no exudates, no oropharyngeal erythema or ulceration NECK: supple, no JVD LYMPH:  no palpable lymphadenopathy in the cervical, axillary or inguinal regions LUNGS: clear to auscultation b/l with normal respiratory effort HEART: regular rate & rhythm ABDOMEN:  normoactive bowel sounds , non tender, not distended. Extremity: no pedal edema PSYCH: alert & oriented x 3 with fluent speech NEURO: no focal motor/sensory deficits   LABORATORY DATA:   I have reviewed the data as listed  . CBC Latest Ref Rng & Units 01/16/2017 12/10/2016 10/29/2016  WBC 4.0 - 10.5 K/uL 8.8 9.3 8.9  Hemoglobin 12.0 - 15.0 g/dL 12.1 11.9(L) 11.6(L)  Hematocrit 36.0 - 46.0 % 38.2 39.2 37.3  Platelets 150 - 400 K/uL 274 280 307    . CMP Latest Ref Rng & Units 12/10/2016 10/29/2016 07/30/2016  Glucose 65 - 99 mg/dL 167(H) 132(H) 119(H)  BUN 6 - 20 mg/dL 15 18 15   Creatinine 0.44 - 1.00 mg/dL 0.67 0.67 0.91  Sodium 135 - 145 mmol/L 136 134(L) 135  Potassium 3.5 -  5.1 mmol/L 4.3 4.4 3.9  Chloride 101 - 111 mmol/L 99(L) 98(L) 101  CO2 22 - 32 mmol/L 29 26 26   Calcium 8.9 - 10.3 mg/dL 8.8(L) 9.1 8.9  Total Protein 6.5 - 8.1 g/dL 8.0 7.5 7.7  Total Bilirubin 0.3 - 1.2 mg/dL 0.6 0.5 0.6  Alkaline Phos 38 - 126 U/L 53 54 55  AST 15 - 41 U/L 30 23 23   ALT 14 - 54 U/L 29 23 21      RADIOGRAPHIC STUDIES: I have personally reviewed the radiological images as listed and agreed with  the findings in the report. Ct Abdomen Pelvis W Contrast  Result Date: 12/26/2016 CLINICAL DATA:  Stage II colon cancer. EXAM: CT ABDOMEN AND PELVIS WITH CONTRAST TECHNIQUE: Multidetector CT imaging of the abdomen and pelvis was performed using the standard protocol following bolus administration of intravenous contrast. CONTRAST:  172m ISOVUE-300 IOPAMIDOL (ISOVUE-300) INJECTION 61% COMPARISON:  06/24/2016 FINDINGS: Lower chest: 6 mm short axis paraesophageal lymph node seen on image 14 is unchanged in the interval. Hepatobiliary: There is a new 2.4 cm peripherally enhancing lesion identified in the lateral segment left liver (image 24 series 2 no other definite liver lesion is evident. There is no evidence for gallstones, gallbladder wall thickening, or pericholecystic fluid. No intrahepatic or extrahepatic biliary dilation. Pancreas: No focal mass lesion. No dilatation of the main duct. No intraparenchymal cyst. No peripancreatic edema. Spleen: No splenomegaly. No focal mass lesion. Adrenals/Urinary Tract: No adrenal nodule or mass. Tiny hypoattenuating cortical lesions right kidney are stable and likely tiny cyst. Left kidney unremarkable. No evidence for hydroureter. The urinary bladder appears normal for the degree of distention. Stomach/Bowel: Stomach is nondistended. No gastric wall thickening. No evidence of outlet obstruction. Duodenum is normally positioned as is the ligament of Treitz. No small bowel wall thickening. No small bowel dilatation. Patient is status post right  hemicolectomy. Diverticular changes are noted in the left colon without evidence of diverticulitis. Vascular/Lymphatic: There is abdominal aortic atherosclerosis without aneurysm. There is no gastrohepatic or hepatoduodenal ligament lymphadenopathy. No intraperitoneal or retroperitoneal lymphadenopathy. No pelvic sidewall lymphadenopathy. Reproductive: The uterus has normal CT imaging appearance. There is no adnexal mass. Other: No intraperitoneal free fluid. Abnormal soft tissue is identified in the cul-de-sac, involving the anterior wall of the distal rectum and posterior cervix (see image 75 series 2). Musculoskeletal: Bone windows reveal no worrisome lytic or sclerotic osseous lesions. IMPRESSION: 1. Interval development of a 2.4 cm rim enhancing lesion in the lateral segment left liver, highly suspicious for metastatic disease. 2. New abnormal irregular soft tissue in the cul-de-sac, involving the anterior wall of the rectum and posterior cervix. This is highly suspicious for metastatic involvement (drop lesion). 3. Interval Right hemicolectomy. 4.  Abdominal Aortic Atherosclerois (ICD10-170.0) These results will be called to the ordering clinician or representative by the Radiologist Assistant, and communication documented in the PACS or zVision Dashboard. Electronically Signed   By: EMisty StanleyM.D.   On: 12/26/2016 13:55   [place]  Result Date: 01/16/2017 INDICATION: 71year old female with a history of right-sided colorectal carcinoma and a new hepatic lesion concerning for metastatic disease. EXAM: Ultrasound-guided biopsy liver lesion MEDICATIONS: None. ANESTHESIA/SEDATION: Moderate (conscious) sedation was employed during this procedure. A total of Versed 2 mg and Fentanyl 100 mcg was administered intravenously. Moderate Sedation Time: 11 minutes. The patient's level of consciousness and vital signs were monitored continuously by radiology nursing throughout the procedure under my direct  supervision. FLUOROSCOPY TIME:  Fluoroscopy Time: 0 minutes 0 seconds (0 mGy). COMPLICATIONS: None immediate. PROCEDURE: Informed written consent was obtained from the patient after a thorough discussion of the procedural risks, benefits and alternatives. All questions were addressed. Maximal Sterile Barrier Technique was utilized including caps, mask, sterile gowns, sterile gloves, sterile drape, hand hygiene and skin antiseptic. A timeout was performed prior to the initiation of the procedure. The mid epigastric region was interrogated with ultrasound. 82.9 by 3.8 cm hypoechoic mass can be identified at anteriorly within the left hepatic lobe. The background hepatic parenchyma is extremely echogenic with coarsening of the echotexture  consistent with fatty liver. A suitable skin entry site was selected and marked. Local anesthesia was attained by infiltration with 1% lidocaine. A small dermatotomy was made. Under real-time sonographic guidance, a 17 gauge introducer needle was advanced through the liver and positioned at the margin of the mass. Multiple 18 gauge core biopsies were then coaxially obtained using the bio Pince automated biopsy device. Biopsy specimens were placed in formalin and delivered to pathology for further analysis. As the introducer needle was removed the biopsy tract was embolized with a Gel-Foam slurry. Post embolization ultrasound imaging demonstrates no active bleeding or complication. The patient tolerated the procedure well. IMPRESSION: Technically successful ultrasound-guided core biopsy of left hepatic lesion. Signed, Criselda Peaches, MD [facility] Electronically Signed   By: Jacqulynn Cadet M.D.   On: 01/16/2017 18:40    ASSESSMENT & PLAN:   71 year old female with   #1 Metastatic Cancer of right colon ([place])  First diagnosed in 06/2016 with Stage IIA (T3N0M0) adenocarcinoma of colon (right-sided) measuring 8.5  cm, grade 2, WITHOUT LVI or PNI, 0/14 lymph nodes with disease, negative margins, and MSI-Stable and preservation of expression of major and minor MMR proteins.  After review of risk factors (No LVI, no PNI, moderately differentiated, 14 negative lymph nodes, and T3 tumor, BUT with obstruction and high pre-operative CEA), good prognostic indicators, and low benefit to survival with adjuvant chemotherapy, patient opted to not pursue treatment.    Now with metastatic disease to the liver and drop metastases to the cul-de-sac. Isolated liver lesion biopsied and proven to be colorectal adenocarcinoma. Plan -CT chest with contrast to complete staging of her now metastatic colorectal cancer. -We'll refer the patient for a port placement. -Will need to hold Coumadin 5 days before port placement and start the patient on Lovenox. Last dose of Lovenox 24 hours before port. 24 hours after port placement if no issues with bleeding would recommend transitioning to a NOAC in the setting of malignancy as opposed to Coumadin. -Would plan to treat with palliative FOLFOX chemotherapy. -Would hold off on Avastin at this time due to multifocal PE noted in 2017. -Would need to follow-up on molecular studies/Foundation one to determine other treatment options. -will need pre-chemotherapy CEA level on f/u labs  #2 Iron deficiency likely due to chronic GI losses -Continue by mouth iron replacement  #3 B12 deficiency negative intrinsic factor and anti-parietal cell antibody testing on 08/19/2016.   -Continue B12 replacement po  #4 history of pulmonary embolism August 2017. -will need continued anticoagulation in the setting of metastatic malignancy. Will need to hold Coumadin 5 days before port placement and start the patient on Lovenox. Last dose of Lovenox 24 hours before port. 24 hours after port placement if no issues with bleeding would recommend transitioning to a NOAC in the setting of malignancy as opposed to  Coumadin.   I spent 30 minutes counseling the patient face to face. The total time spent in the appointment was 40 minutes and more than 50% was on counseling and direct patient cares.    Sullivan Lone MD [place] AAHIVMS [facility] Hematology/Oncology Physician [facility]  (Office):       [phone] (Work cell):  [phone] (Fax):           [phone]

## 2017-01-21 NOTE — Patient Instructions (Addendum)
[place] at [facility] Discharge Instructions  RECOMMENDATIONS MADE BY THE CONSULTANT AND ANY TEST RESULTS WILL BE SENT TO YOUR REFERRING PHYSICIAN.  You were seen today by Dr. Irene Limbo We will refer you to Dr. Arnoldo Morale for port a cath placement [facility] or [facility] will be in contact with you for chemotherapy teaching Follow up in 2 weeks with first chemotherapy treatment See Amy up front for appointments   Thank you for choosing [place] at [facility] to provide your oncology and hematology care.  To afford each patient quality time with our provider, please arrive at least 15 minutes before your scheduled appointment time.    If you have a lab appointment with the Portal please come in thru the  Main Entrance and check in at the main information desk  You need to re-schedule your appointment should you arrive 10 or more minutes late.  We strive to give you quality time with our providers, and arriving late affects you and other patients whose appointments are after yours.  Also, if you no show three or more times for appointments you may be dismissed from the clinic at the providers discretion.     Again, thank you for choosing [facility].  Our hope is that these requests will decrease the amount of time that you wait before being seen by our physicians.       _____________________________________________________________  Should you have questions after your visit to [facility], please contact our office at [phone] between the hours of 8:30 a.m. and 4:30 p.m.  Voicemails left after 4:30 p.m. will not be returned until the following business day.  For prescription refill requests, have your pharmacy contact our office.       Resources For Cancer Patients and their Caregivers ? American Cancer Society: Can assist with transportation, wigs, general needs, runs Look Good Feel Better.         ([phone] ? Cancer Care: Provides financial assistance, online support groups, medication/co-pay assistance.  1-800-813-HOPE (587)410-1778) ? [place] Assists [place] cancer patients and their families through emotional , educational and financial support.  315-268-7552 ? [place] DSS Where to apply for food stamps, Medicaid and utility assistance. (570)191-1477 ? [facility]: Transportation to medical appointments. 540-347-0656 ? Social Security Administration: May apply for disability if have a Stage IV cancer. 4328540769 (919)471-7283 ? [employer], Disability and Transit Services: Assists with nutrition, care and transit needs. [place] Support Programs: @10RELATIVEDAYS @ > Cancer Support Group  2nd Tuesday of the month 1pm-2pm, Journey Room  > Creative Journey  3rd Tuesday of the month 1130am-1pm, Journey Room  > Look Good Feel Better  1st Wednesday of the month 10am-12 noon, Journey Room (Call [place] to register [phone])

## 2017-01-25 NOTE — Progress Notes (Signed)
Patient on plan of care prior to pathways. 

## 2017-01-25 NOTE — Progress Notes (Signed)
START ON PATHWAY REGIMEN - Colorectal     A cycle is every 14 days:     Oxaliplatin      Leucovorin      5-Fluorouracil      5-Fluorouracil      Bevacizumab   **Always confirm dose/schedule in your pharmacy ordering system**    Patient Characteristics: Metastatic Colorectal, First Line, Potentially Resectable, KRAS Mutation Positive/Unknown, BRAF Wild-Type/Unknown Current evidence of distant metastases? Yes AJCC T Category: TX AJCC N Category: NX AJCC M Category: M1b AJCC 8 Stage Grouping: IVB BRAF Mutation Status: Awaiting Test Results KRAS/NRAS Mutation Status: Awaiting Test Results Line of therapy: First Line Would you be surprised if this patient died  in the next year? I would be surprised if this patient died in the next year  Intent of Therapy: Non-Curative / Palliative Intent, Discussed with Patient

## 2017-01-26 ENCOUNTER — Other Ambulatory Visit (HOSPITAL_COMMUNITY): Payer: Self-pay | Admitting: Oncology

## 2017-01-26 DIAGNOSIS — I2692 Saddle embolus of pulmonary artery without acute cor pulmonale: Secondary | ICD-10-CM

## 2017-01-26 MED ORDER — ENOXAPARIN SODIUM 30 MG/0.3ML ~~LOC~~ SOLN: 135.0000 mg | Freq: Two times a day (BID) | SUBCUTANEOUS | 1 refills | Status: DC

## 2017-01-27 ENCOUNTER — Other Ambulatory Visit (HOSPITAL_COMMUNITY): Payer: Self-pay | Admitting: Oncology

## 2017-01-27 ENCOUNTER — Encounter: Payer: Self-pay | Admitting: General Surgery

## 2017-01-27 ENCOUNTER — Ambulatory Visit (INDEPENDENT_AMBULATORY_CARE_PROVIDER_SITE_OTHER): Payer: Medicare Other | Admitting: General Surgery

## 2017-01-27 ENCOUNTER — Ambulatory Visit (HOSPITAL_COMMUNITY): Payer: Medicare Other | Admitting: Oncology

## 2017-01-27 ENCOUNTER — Other Ambulatory Visit (HOSPITAL_COMMUNITY): Payer: Medicare Other

## 2017-01-27 ENCOUNTER — Telehealth (HOSPITAL_COMMUNITY): Payer: Self-pay | Admitting: *Deleted

## 2017-01-27 ENCOUNTER — Other Ambulatory Visit (HOSPITAL_COMMUNITY): Payer: Self-pay | Admitting: Pharmacist

## 2017-01-27 VITALS — BP 152/93 | HR 100 | Temp 97.5°F | Resp 18 | Ht 62.0 in | Wt 198.0 lb

## 2017-01-27 DIAGNOSIS — C787 Secondary malignant neoplasm of liver and intrahepatic bile duct: Secondary | ICD-10-CM | POA: Diagnosis not present

## 2017-01-27 DIAGNOSIS — C189 Malignant neoplasm of colon, unspecified: Secondary | ICD-10-CM | POA: Diagnosis not present

## 2017-01-27 DIAGNOSIS — I2692 Saddle embolus of pulmonary artery without acute cor pulmonale: Secondary | ICD-10-CM

## 2017-01-27 MED ORDER — RIVAROXABAN 20 MG PO TABS: 20.0000 mg | ORAL_TABLET | Freq: Every day | ORAL | 3 refills | Status: DC

## 2017-01-27 NOTE — Telephone Encounter (Signed)
Pt aware of medication instructions.Per Kirby Crigler PA-C, pt should hold her coumadin 5 days before her port placement, Start Lovenox daily while coumadin held. Hold Lovenox 24 hours before port placement, restart Lovenox 24 hours after port placement. Take Lovenox for two days on the third day start Xarelto 20mg  PO daily with supper.  Xarelto sent to her pharmacy. Pt states that last year she was put on coumadin because she could not afford to pay $1,000.00 a month for the Xarelto.  I advised the pt to call her pharmacy and see what her cost would be and let us know if she would not be able to afford the medication. Pt verbalized understanding.

## 2017-01-27 NOTE — H&P (Signed)
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 She is about to undergo chemotherapy and needs central venous access. She has no pain. She is referred by oncology. She has been referred for scheduling purposes. She is on Coumadin due to her metastatic colon cancer.     Past Medical History:  Diagnosis Date  . Cancer of right colon ([place]) 07/30/2016  . Colon cancer ([place])   . Hypercholesterolemia   . Hypertension   . Iron deficiency anemia due to chronic blood loss 07/30/2016  . Vitamin B12 deficiency 08/13/2016    Past Surgical History:  Procedure Laterality Date  . COLONOSCOPY N/A 11/05/2016   Procedure: COLONOSCOPY;  Surgeon: Rogene Houston, MD;  Location: [facility];  Service: Endoscopy;  Laterality: N/A;  1:20  . PARTIAL COLECTOMY N/A 06/27/2016   Procedure: PARTIAL COLECTOMY;  Surgeon: Vickie Epley, MD;  Location: [facility];  Service: General;  Laterality: N/A;          Family History  Problem Relation Age of Onset  . Hypertension Mother   . Other Father     some type of "stomach ailment"  . Suicidality Brother   . Stomach cancer Maternal Aunt   . Brain cancer Brother           Current Outpatient Prescriptions on File Prior to Visit  Medication Sig Dispense Refill  . acetaminophen (TYLENOL) 325 MG tablet Take 325 mg by mouth every 6 (six) hours as needed for mild pain or moderate pain.    Marland Kitchen amLODipine (NORVASC) 5 MG tablet Take 5 mg by mouth daily.    . Cyanocobalamin (B-12) 5000 MCG SUBL Place 1 tablet under the tongue daily. 30 tablet 11  . enoxaparin (LOVENOX) 30 MG/0.3ML injection Inject 1.4 mLs (140 mg total) into the skin every 12 (twelve) hours. Hold Coumadin 5 days prior to port placement and start Lovenox injection every evening.  HOLD Lovenox 24 hours prior to port placement.  Restart Lovenox 24 hours after  port placement. 10 Syringe 1  . escitalopram (LEXAPRO) 10 MG tablet Take 10 mg by mouth daily.    . iron polysaccharides (NIFEREX) 150 MG capsule Take 1 capsule (150 mg total) by mouth daily. 30 capsule 6  . Omega-3 Fatty Acids (FISH OIL) 1200 MG CAPS Take 1 capsule by mouth daily.    . simvastatin (ZOCOR) 40 MG tablet Take 40 mg by mouth daily.     No current facility-administered medications on file prior to visit.     No Known Allergies       History  Alcohol Use No    Comment: glass wine  twice per year          History  Smoking Status  . Former Smoker  . Packs/day: 0.50  . Years: 48.00  . Types: Cigarettes  . Quit date: 06/24/2016  Smokeless Tobacco  . Never Used    Comment: quit smoking Sept 2017    Review of Systems  Constitutional: Negative.   HENT: Negative.   Eyes: Negative.   Respiratory: Negative.   Cardiovascular: Negative.   Gastrointestinal: Negative.   Genitourinary: Negative.   Musculoskeletal: Negative.   Skin: Negative.   Neurological: Negative.   Endo/Heme/Allergies: Bruises/bleeds easily.  Psychiatric/Behavioral: Negative.     Objective      Vitals:   01/27/17 1014  BP: (!) 152/93  Pulse: 100  Resp: 18  Temp: 97.5 F (36.4 C)    Physical Exam  Constitutional: She is oriented to person, place, and time and well-developed, well-nourished, and in no distress.  HENT:  Head: Normocephalic and atraumatic.  Neck: Normal range of motion. Neck supple.  Cardiovascular: Normal rate, regular rhythm and normal heart sounds.   Pulmonary/Chest: Effort normal and breath sounds normal. She has no wheezes.  Abdominal: Soft. Bowel sounds are normal. She exhibits no distension. There is no tenderness.  Neurological: She is alert and oriented to person, place, and time.  Skin: Skin is warm and dry.  Vitals reviewed.   Assessment   Metastatic colon cancer to liver, need for central venous access, anticoagulation  Plan    Patient scheduled for Port-A-Cath insertion on 02/09/2017. The risks and benefits of the procedure including bleeding, infection, and pneumothorax were fully explained to the patient, who gave informed consent. She is to stop her Coumadin on 02/04/2017. She is to start her Lovenox injections as prescribed by oncology on 02/04/2017. She is not to inject herself on 02/09/2017.

## 2017-01-27 NOTE — Patient Instructions (Addendum)
Stop coumadin on 02/04/17, start lovenox injections as prescribed on 02/04/17.  Do not inject lovenox on 02/09/17.      Implanted [facility] Guide An implanted port is a type of central line that is placed under the skin. Central lines are used to provide IV access when treatment or nutrition needs to be given through a person's veins. Implanted ports are used for long-term IV access. An implanted port may be placed because:  You need IV medicine that would be irritating to the small veins in your hands or arms.  You need long-term IV medicines, such as antibiotics.  You need IV nutrition for a long period.  You need frequent blood draws for lab tests.  You need dialysis. Implanted ports are usually placed in the chest area, but they can also be placed in the upper arm, the abdomen, or the leg. An implanted port has two main parts:  Reservoir. The reservoir is round and will appear as a small, raised area under your skin. The reservoir is the part where a needle is inserted to give medicines or draw blood.  Catheter. The catheter is a thin, flexible tube that extends from the reservoir. The catheter is placed into a large vein. Medicine that is inserted into the reservoir goes into the catheter and then into the vein. How will I care for my incision site? Do not get the incision site wet. Bathe or shower as directed by your health care provider. How is my port accessed? Special steps must be taken to access the port:  Before the port is accessed, a numbing cream can be placed on the skin. This helps numb the skin over the port site.  Your health care provider uses a sterile technique to access the port.  Your health care provider must put on a mask and sterile gloves.  The skin over your port is cleaned carefully with an antiseptic and allowed to dry.  The port is gently pinched between sterile gloves, and a needle is inserted into the port.  Only "non-coring" port needles should be  used to access the port. Once the port is accessed, a blood return should be checked. This helps ensure that the port is in the vein and is not clogged.  If your port needs to remain accessed for a constant infusion, a clear (transparent) bandage will be placed over the needle site. The bandage and needle will need to be changed every week, or as directed by your health care provider.  Keep the bandage covering the needle clean and dry. Do not get it wet. Follow your health care provider's instructions on how to take a shower or bath while the port is accessed.  If your port does not need to stay accessed, no bandage is needed over the port. What is flushing? Flushing helps keep the port from getting clogged. Follow your health care provider's instructions on how and when to flush the port. Ports are usually flushed with saline solution or a medicine called heparin. The need for flushing will depend on how the port is used.  If the port is used for intermittent medicines or blood draws, the port will need to be flushed:  After medicines have been given.  After blood has been drawn.  As part of routine maintenance.  If a constant infusion is running, the port may not need to be flushed. How long will my port stay implanted? The port can stay in for as long as your health  care provider thinks it is needed. When it is time for the port to come out, surgery will be done to remove it. The procedure is similar to the one performed when the port was put in. When should I seek immediate medical care? When you have an implanted port, you should seek immediate medical care if:  You notice a bad smell coming from the incision site.  You have swelling, redness, or drainage at the incision site.  You have more swelling or pain at the port site or the surrounding area.  You have a fever that is not controlled with medicine. This information is not intended to replace advice given to you by your  health care provider. Make sure you discuss any questions you have with your health care provider. Document Released: 10/20/2005 Document Revised: 03/27/2016 Document Reviewed: 06/27/2013 Elsevier Interactive Patient Education  2017 [employer].

## 2017-01-27 NOTE — Progress Notes (Signed)
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 She is about to undergo chemotherapy and needs central venous access. She has no pain. She is referred by oncology. She has been referred for scheduling purposes. She is on Coumadin due to her metastatic colon cancer. Past Medical History:  Diagnosis Date  . Cancer of right colon ([place]) 07/30/2016  . Colon cancer ([place])   . Hypercholesterolemia   . Hypertension   . Iron deficiency anemia due to chronic blood loss 07/30/2016  . Vitamin B12 deficiency 08/13/2016    Past Surgical History:  Procedure Laterality Date  . COLONOSCOPY N/A 11/05/2016   Procedure: COLONOSCOPY;  Surgeon: Rogene Houston, MD;  [facility];  Service: Endoscopy;  Laterality: N/A;  1:20  . PARTIAL COLECTOMY N/A 06/27/2016   Procedure: PARTIAL COLECTOMY;  Surgeon: Vickie Epley, MD;  Location: [facility];  Service: General;  Laterality: N/A;    Family History  Problem Relation Age of Onset  . Hypertension Mother   . Other Father     some type of "stomach ailment"  . Suicidality Brother   . Stomach cancer Maternal Aunt   . Brain cancer Brother     Current Outpatient Prescriptions on File Prior to Visit  Medication Sig Dispense Refill  . acetaminophen (TYLENOL) 325 MG tablet Take 325 mg by mouth every 6 (six) hours as needed for mild pain or moderate pain.    Marland Kitchen amLODipine (NORVASC) 5 MG tablet Take 5 mg by mouth daily.    . Cyanocobalamin (B-12) 5000 MCG SUBL Place 1 tablet under the tongue daily. 30 tablet 11  . enoxaparin (LOVENOX) 30 MG/0.3ML injection Inject 1.4 mLs (140 mg total) into the skin every 12 (twelve) hours. Hold Coumadin 5 days prior to port placement and start Lovenox injection every evening.  HOLD Lovenox 24 hours prior to port placement.  Restart Lovenox 24 hours after port placement. 10 Syringe 1  .  escitalopram (LEXAPRO) 10 MG tablet Take 10 mg by mouth daily.    . iron polysaccharides (NIFEREX) 150 MG capsule Take 1 capsule (150 mg total) by mouth daily. 30 capsule 6  . Omega-3 Fatty Acids (FISH OIL) 1200 MG CAPS Take 1 capsule by mouth daily.    . simvastatin (ZOCOR) 40 MG tablet Take 40 mg by mouth daily.     No current facility-administered medications on file prior to visit.     No Known Allergies  History  Alcohol Use No    Comment: glass wine  twice per year    History  Smoking Status  . Former Smoker  . Packs/day: 0.50  . Years: 48.00  . Types: Cigarettes  . Quit date: 06/24/2016  Smokeless Tobacco  . Never Used    Comment: quit smoking Sept 2017    Review of Systems  Constitutional: Negative.   HENT: Negative.   Eyes: Negative.   Respiratory: Negative.   Cardiovascular: Negative.   Gastrointestinal: Negative.   Genitourinary: Negative.   Musculoskeletal: Negative.   Skin: Negative.   Neurological: Negative.   Endo/Heme/Allergies: Bruises/bleeds easily.  Psychiatric/Behavioral: Negative.     Objective   Vitals:   01/27/17 1014  BP: (!) 152/93  Pulse: 100  Resp: 18  Temp: 97.5 F (36.4 C)    Physical Exam  Constitutional: She is oriented to person, place, and time and well-developed, well-nourished, and in no  distress.  HENT:  Head: Normocephalic and atraumatic.  Neck: Normal range of motion. Neck supple.  Cardiovascular: Normal rate, regular rhythm and normal heart sounds.   Pulmonary/Chest: Effort normal and breath sounds normal. She has no wheezes.  Abdominal: Soft. Bowel sounds are normal. She exhibits no distension. There is no tenderness.  Neurological: She is alert and oriented to person, place, and time.  Skin: Skin is warm and dry.  Vitals reviewed.   Assessment   Metastatic colon cancer to liver, need for central venous access, anticoagulation  Plan   Patient scheduled for Port-A-Cath insertion on 02/09/2017. The risks and  benefits of the procedure including bleeding, infection, and pneumothorax were fully explained to the patient, who gave informed consent. She is to stop her Coumadin on 02/04/2017. She is to start her Lovenox injections as prescribed by oncology on 02/04/2017. She is not to inject herself on 02/09/2017.

## 2017-01-28 ENCOUNTER — Other Ambulatory Visit (HOSPITAL_COMMUNITY): Payer: Self-pay | Admitting: Pharmacist

## 2017-02-03 ENCOUNTER — Telehealth (HOSPITAL_COMMUNITY): Payer: Self-pay | Admitting: *Deleted

## 2017-02-03 ENCOUNTER — Other Ambulatory Visit (HOSPITAL_COMMUNITY): Payer: Self-pay | Admitting: Oncology

## 2017-02-03 DIAGNOSIS — I2692 Saddle embolus of pulmonary artery without acute cor pulmonale: Secondary | ICD-10-CM

## 2017-02-03 MED ORDER — DEXAMETHASONE 4 MG PO TABS: 8.0000 mg | ORAL_TABLET | Freq: Every day | ORAL | 1 refills | Status: AC

## 2017-02-03 MED ORDER — PROCHLORPERAZINE MALEATE 10 MG PO TABS: 10.0000 mg | ORAL_TABLET | Freq: Four times a day (QID) | ORAL | 1 refills | Status: DC | PRN

## 2017-02-03 MED ORDER — ENOXAPARIN SODIUM 30 MG/0.3ML ~~LOC~~ SOLN: 135.0000 mg | Freq: Two times a day (BID) | SUBCUTANEOUS | 1 refills | Status: DC

## 2017-02-03 MED ORDER — ENOXAPARIN SODIUM 30 MG/0.3ML ~~LOC~~ SOLN: 140.0000 mg | SUBCUTANEOUS | 1 refills | Status: DC

## 2017-02-03 MED ORDER — LIDOCAINE-PRILOCAINE 2.5-2.5 % EX CREA: TOPICAL_CREAM | CUTANEOUS | 3 refills | Status: AC

## 2017-02-03 MED ORDER — ONDANSETRON HCL 8 MG PO TABS: 8.0000 mg | ORAL_TABLET | Freq: Two times a day (BID) | ORAL | 1 refills | Status: AC | PRN

## 2017-02-03 NOTE — Patient Instructions (Signed)
Janet Hanson  02/03/2017     @PREFPERIOPPHARMACY @   Your procedure is scheduled on  02/09/2017   Report to [facility] at  13  A.M.  Call this number if you have problems the morning of surgery:  [phone]   Remember:  Do not eat food or drink liquids after midnight.  Take these medicines the morning of surgery with A SIP OF WATER  norvasc, lexapro.   Do not wear jewelry, make-up or nail polish.  Do not wear lotions, powders, or perfumes, or deoderant.  Do not shave 48 hours prior to surgery.  Men may shave face and neck.  Do not bring valuables to the hospital.  [facility] is not responsible for any belongings or valuables.  Contacts, dentures or bridgework may not be worn into surgery.  Leave your suitcase in the car.  After surgery it may be brought to your room.  For patients admitted to the hospital, discharge time will be determined by your treatment team.  Patients discharged the day of surgery will not be allowed to drive home.   Name and phone number of your driver:   family Special instructions:  None  Please read over the following fact sheets that you were given. Anesthesia Post-op Instructions and Care and Recovery After Surgery       Implanted Port Insertion Implanted port insertion is a procedure to put in a port and catheter. The port is a device with an injectable disk that can be accessed by your health care provider. The port is connected to a vein in the chest or neck by a small flexible tube (catheter). There are different types of ports. The implanted port may be used as a long-term IV access for:  Medicines, such as chemotherapy.  Fluids.  Liquid nutrition, such as total parenteral nutrition (TPN).  Blood samples. Having a port means that your health care provider will not need to use the veins in your arms for these procedures. Tell a health care provider about:  Any allergies you have.  All medicines you are taking,  especially blood thinners, as well as any vitamins, herbs, eye drops, creams, over-the-counter medicines, and steroids.  Any problems you or family members have had with anesthetic medicines.  Any blood disorders you have.  Any surgeries you have had.  Any medical conditions you have, including diabetes or kidney problems.  Whether you are pregnant or may be pregnant. What are the risks? Generally, this is a safe procedure. However, problems may occur, including:  Allergic reactions to medicines or dyes.  Damage to other structures or organs.  Infection.  Damage to the blood vessel, bruising, or bleeding at the puncture site.  Blood clot.  Breakdown of the skin over the port.  A collection of air in the chest that can cause one of the lungs to collapse (pneumothorax). This is rare. What happens before the procedure? Staying hydrated  Follow instructions from your health care provider about hydration, which may include:  Up to 2 hours before the procedure - you may continue to drink clear liquids, such as water, clear fruit juice, black coffee, and plain tea. Eating and drinking restrictions   Follow instructions from your health care provider about eating and drinking, which may include:  8 hours before the procedure - stop eating heavy meals or foods such as meat, fried foods, or fatty foods.  6 hours before the procedure - stop  eating light meals or foods, such as toast or cereal.  6 hours before the procedure - stop drinking milk or drinks that contain milk.  2 hours before the procedure - stop drinking clear liquids. Medicines   Ask your health care provider about:  Changing or stopping your regular medicines. This is especially important if you are taking diabetes medicines or blood thinners.  Taking medicines such as aspirin and ibuprofen. These medicines can thin your blood. Do not take these medicines before your procedure if your health care provider  instructs you not to.  You may be given antibiotic medicine to help prevent infection. General instructions   Plan to have someone take you home from the hospital or clinic.  If you will be going home right after the procedure, plan to have someone with you for 24 hours.  You may have blood tests.  You may be asked to shower with a germ-killing soap. What happens during the procedure?  To lower your risk of infection:  Your health care team will wash or sanitize their hands.  Your skin will be washed with soap.  Hair may be removed from the surgical area.  An IV tube will be inserted into one of your veins.  You will be given one or more of the following:  A medicine to help you relax (sedative).  A medicine to numb the area (local anesthetic).  Two small cuts (incisions) will be made to insert the port.  One incision will be made in your neck to get access to the vein where the catheter will lie.  The other incision will be made in the upper chest. This is where the port will lie.  The procedure may be done using continuous X-ray (fluoroscopy) or other imaging tools for guidance.  The port and catheter will be placed. There may be a small, raised area where the port is.  The port will be flushed with a salt solution (saline), and blood will be drawn to make sure that it is working correctly.  The incisions will be closed.  Bandages (dressings) may be placed over the incisions. The procedure may vary among health care providers and hospitals. What happens after the procedure?  Your blood pressure, heart rate, breathing rate, and blood oxygen level will be monitored until the medicines you were given have worn off.  Do not drive for 24 hours if you were given a sedative.  You will be given a manufacturer's information card for the type of port that you have. Keep this with you.  Your port will need to be flushed and checked as told by your health care provider,  usually every few weeks.  A chest X-ray will be done to:  Check the placement of the port.  Make sure there is no injury to your lung. Summary  Implanted port insertion is a procedure to put in a port and catheter.  The implanted port is used as a long-term IV access.  The port will need to be flushed and checked as told by your health care provider, usually every few weeks.  Keep your manufacturer's information card with you at all times. This information is not intended to replace advice given to you by your health care provider. Make sure you discuss any questions you have with your health care provider. Document Released: 08/10/2013 Document Revised: 09/10/2016 Document Reviewed: 09/10/2016 Elsevier Interactive Patient Education  2017 [place] Insertion, Care After This sheet gives you information  about how to care for yourself after your procedure. Your health care provider may also give you more specific instructions. If you have problems or questions, contact your health care provider. What can I expect after the procedure? After your procedure, it is common to have:  Discomfort at the port insertion site.  Bruising on the skin over the port. This should improve over 3-4 days. Follow these instructions at home: [facility] care   After your port is placed, you will get a manufacturer's information card. The card has information about your port. Keep this card with you at all times.  Take care of the port as told by your health care provider. Ask your health care provider if you or a family member can get training for taking care of the port at home. A home health care nurse may also take care of the port.  Make sure to remember what type of port you have. Incision care   Follow instructions from your health care provider about how to take care of your port insertion site. Make sure you:  Wash your hands with soap and water before you change your bandage  (dressing). If soap and water are not available, use hand sanitizer.  Change your dressing as told by your health care provider.  Leave stitches (sutures), skin glue, or adhesive strips in place. These skin closures may need to stay in place for 2 weeks or longer. If adhesive strip edges start to loosen and curl up, you may trim the loose edges. Do not remove adhesive strips completely unless your health care provider tells you to do that.  Check your port insertion site every day for signs of infection. Check for:  More redness, swelling, or pain.  More fluid or blood.  Warmth.  Pus or a bad smell. General instructions   Do not take baths, swim, or use a hot tub until your health care provider approves.  Do not lift anything that is heavier than 10 lb (4.5 kg) for a week, or as told by your health care provider.  Ask your health care provider when it is okay to:  Return to work or school.  Resume usual physical activities or sports.  Do not drive for 24 hours if you were given a medicine to help you relax (sedative).  Take over-the-counter and prescription medicines only as told by your health care provider.  Wear a medical alert bracelet in case of an emergency. This will tell any health care providers that you have a port.  Keep all follow-up visits as told by your health care provider. This is important. Contact a health care provider if:  You cannot flush your port with saline as directed, or you cannot draw blood from the port.  You have a fever or chills.  You have more redness, swelling, or pain around your port insertion site.  You have more fluid or blood coming from your port insertion site.  Your port insertion site feels warm to the touch.  You have pus or a bad smell coming from the port insertion site. Get help right away if:  You have chest pain or shortness of breath.  You have bleeding from your port that you cannot control. Summary  Take care  of the port as told by your health care provider.  Change your dressing as told by your health care provider.  Keep all follow-up visits as told by your health care provider. This information is not intended to replace  advice given to you by your health care provider. Make sure you discuss any questions you have with your health care provider. Document Released: 08/10/2013 Document Revised: 09/10/2016 Document Reviewed: 09/10/2016 Elsevier Interactive Patient Education  2017 [place] An implanted port is a type of central line that is placed under the skin. Central lines are used to provide IV access when treatment or nutrition needs to be given through a person's veins. Implanted ports are used for long-term IV access. An implanted port may be placed because:  You need IV medicine that would be irritating to the small veins in your hands or arms.  You need long-term IV medicines, such as antibiotics.  You need IV nutrition for a long period.  You need frequent blood draws for lab tests.  You need dialysis. Implanted ports are usually placed in the chest area, but they can also be placed in the upper arm, the abdomen, or the leg. An implanted port has two main parts:  Reservoir. The reservoir is round and will appear as a small, raised area under your skin. The reservoir is the part where a needle is inserted to give medicines or draw blood.  Catheter. The catheter is a thin, flexible tube that extends from the reservoir. The catheter is placed into a large vein. Medicine that is inserted into the reservoir goes into the catheter and then into the vein. How will I care for my incision site? Do not get the incision site wet. Bathe or shower as directed by your health care provider. How is my port accessed? Special steps must be taken to access the port:  Before the port is accessed, a numbing cream can be placed on the skin. This helps numb the skin over  the port site.  Your health care provider uses a sterile technique to access the port.  Your health care provider must put on a mask and sterile gloves.  The skin over your port is cleaned carefully with an antiseptic and allowed to dry.  The port is gently pinched between sterile gloves, and a needle is inserted into the port.  Only "non-coring" port needles should be used to access the port. Once the port is accessed, a blood return should be checked. This helps ensure that the port is in the vein and is not clogged.  If your port needs to remain accessed for a constant infusion, a clear (transparent) bandage will be placed over the needle site. The bandage and needle will need to be changed every week, or as directed by your health care provider.  Keep the bandage covering the needle clean and dry. Do not get it wet. Follow your health care provider's instructions on how to take a shower or bath while the port is accessed.  If your port does not need to stay accessed, no bandage is needed over the port. What is flushing? Flushing helps keep the port from getting clogged. Follow your health care provider's instructions on how and when to flush the port. Ports are usually flushed with saline solution or a medicine called heparin. The need for flushing will depend on how the port is used.  If the port is used for intermittent medicines or blood draws, the port will need to be flushed:  After medicines have been given.  After blood has been drawn.  As part of routine maintenance.  If a constant infusion is running, the port may not need to be flushed. How  long will my port stay implanted? The port can stay in for as long as your health care provider thinks it is needed. When it is time for the port to come out, surgery will be done to remove it. The procedure is similar to the one performed when the port was put in. When should I seek immediate medical care? When you have an implanted  port, you should seek immediate medical care if:  You notice a bad smell coming from the incision site.  You have swelling, redness, or drainage at the incision site.  You have more swelling or pain at the port site or the surrounding area.  You have a fever that is not controlled with medicine. This information is not intended to replace advice given to you by your health care provider. Make sure you discuss any questions you have with your health care provider. Document Released: 10/20/2005 Document Revised: 03/27/2016 Document Reviewed: 06/27/2013 Elsevier Interactive Patient Education  2017 [employer] PATIENT INSTRUCTIONS POST-ANESTHESIA  IMMEDIATELY FOLLOWING SURGERY:  Do not drive or operate machinery for the first twenty four hours after surgery.  Do not make any important decisions for twenty four hours after surgery or while taking narcotic pain medications or sedatives.  If you develop intractable nausea and vomiting or a severe headache please notify your doctor immediately.  FOLLOW-UP:  Please make an appointment with your surgeon as instructed. You do not need to follow up with anesthesia unless specifically instructed to do so.  WOUND CARE INSTRUCTIONS (if applicable):  Keep a dry clean dressing on the anesthesia/puncture wound site if there is drainage.  Once the wound has quit draining you may leave it open to air.  Generally you should leave the bandage intact for twenty four hours unless there is drainage.  If the epidural site drains for more than 36-48 hours please call the anesthesia department.  QUESTIONS?:  Please feel free to call your physician or the hospital operator if you have any questions, and they will be happy to assist you.

## 2017-02-03 NOTE — Patient Instructions (Addendum)
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  We are going to treat you with the regimen FOLFOX with avastin.  This will be given every 2 weeks.  You will have a pump that you take home home for 48 hours.  This treatment is with palliative intent, which means you are treatable but not curable.  You will see the doctor regularly throughout treatment.  We monitor your lab work prior to every treatment.  The doctor monitors your response to treatment by the way you are feeling, your blood work, and scans periodically.   POTENTIAL SIDE EFFECTS OF TREATMENT:  5-Fluorouracil (Adrucil)  About This Drug Fluorouracil is used to treat cancer. It is given in the vein (IV).  Possible Side Effects . Hair loss. Hair loss is often temporary, although with certain medicine, hair loss can sometimes be permanent. Hair loss may happen suddenly or gradually. If you lose hair, you may lose it from your head, face, armpits, pubic area, chest, and/or legs. You may also notice your hair getting thin. . Changes in your nail color, nail loss and/or brittle nail . Darkening of the skin, or changes to the color of your skin and/or veins used for infusion . Rash, itching . Nausea and throwing up (vomiting) . Loose bowel movements (diarrhea) . Ulcers - sores that may cause pain or bleeding in your digestive tract, which includes your mouth, esophagus, stomach, small/large intestines and rectum . Soreness of the mouth and throat. You may have red areas, white patches, or sores that hurt. . Decreased appetite (decreased hunger) . Changes in the tissue of the heart and/or heart attack. Some changes may happen that can cause your heart to have less ability to pump blood. . Bone marrow depression. This is a decrease in the number of white blood cells, red blood cells, and platelets. This may raise your risk of infection, make you  tired and weak (fatigue), and raise your risk of bleeding . Sensitivity to light (photosensitivity). Photosensitivity means that you may become more sensitive to the sun and/or light. Your eyes may water more, mostly in bright light. . Allergic reaction: Allergic reactions, including anaphylaxis are rare but may happen in some patients. Signs of allergic reaction to this drug may be swelling of the face, feeling like your tongue or throat are swelling, trouble breathing, rash, itching, fever, chills, feeling dizzy, and/or feeling that your heart is beating in a fast or not normal way. If this happens, do not take another dose of this drug. You should get urgent medical treatment. . Blurred vision or other changes in eyesight Note: Not all possible side effects are included above.  Warnings and Precautions . Hand-and-foot syndrome. The palms of your hands or soles of your feet may tingle, become numb, painful, swollen, or red. . Changes in your central nervous system can happen. The central nervous system is made up of your brain and spinal cord. You could feel extreme tiredness, agitation, confusion, hallucinations (see or hear things that are not there), trouble understanding or speaking, loss of control of your bowels or bladder, eyesight changes, numbness or lack of strength to your arms, legs, face, or body, and coma. If you start to have any of these symptoms let your doctor know right away. . Side effects of this drug may be unexpectedly severe in some patients Note: Some of the side effects above are very rare. If you have  concerns and/or questions, please discuss them with your medical team.  Important Information . This drug may be present in the saliva, tears, sweat, urine, stool, vomit, semen, and vaginal secretions. Talk to your doctor and/or your nurse about the necessary precautions to take during this time.  Treating Side Effects . To help with hair loss, wash with a mild  shampoo and avoid washing your hair every day. . Avoid rubbing your scalp, pat your hair or scalp dry. . Avoid coloring your hair. . Limit your use of hair spray, electric curlers, blow dryers, and curling irons. . If you are interested in getting a wig, talk to your nurse. You can also call the [facility] at 800-ACS-2345 to find out information about the "Look Good, Feel Better" program close to where you live. It is a free program where women getting chemotherapy can learn about wigs, turbans and scarves as well as makeup techniques and skin and nail care. Marland Kitchen Keeping your nails moisturized may help with brittleness. . To help with itching, moisturize your skin several times day. . Avoid sun exposure and apply sunscreen routinely when outdoors. . If you get a rash do not put anything on it unless your doctor or nurse says you may. Keep the area around the rash clean and dry. Ask your doctor for medicine if your rash bothers you. . To help with decreased appetite, eat small, frequent meals. . Eat high caloric food such as pudding, ice cream, yogurt and milkshakes. . Drink plenty of fluids (a minimum of eight glasses per day is recommended). . If you throw up or have loose bowel movements, you should drink more fluids so that you do not become dehydrated (lack water in the body from losing too much fluid). . To help with nausea and vomiting, eat small, frequent meals instead of three large meals a day. Choose foods and drinks that are at room temperature. Ask your nurse or doctor about other helpful tips and medicine that is available to help or stop lessen these symptoms. . If you get diarrhea, eat low-fiber foods that are high in protein and calories and avoid foods that can irritate your digestive tracts or lead to cramping. . Ask your nurse or doctor about medicine that can lessen or stop your diarrhea. . Mouth care is very important. Your mouth care should consist of routine,  gentle cleaning of your teeth or dentures and rinsing your mouth with a mixture of 1/2 teaspoon of salt in 8 ounces of water or  teaspoon of baking soda in 8 ounces of water. This should be done at least after each meal and at bedtime. . If you have mouth sores, avoid mouthwash that has alcohol. Also avoid alcohol and smoking because they can bother your mouth and throat. . Manage tiredness by pacing your activities for the day. . Be sure to include periods of rest between energy-draining activities. . To help decrease your risk of infections, wash your hands regularly. . Avoid close contact with people who have a cold, the flu, or other infections. . Use a soft toothbrush. Check with your nurse before using dental floss. . Be very careful when using knives or tools. . Use an electric shaver instead of a razor.  Food and Drug Interactions . There are no known interactions of fluorouracil with food. . Check with your doctor or pharmacist about all other prescription medicines and dietary supplements you are taking before starting this medicine as there are a lot  of known drug interactions with fluorouracil. Also, check with your doctor or pharmacist before starting any new prescription or over-the-counter medicines, or dietary supplement to make sure that there are no interactions.  When to Call the Doctor Call your doctor or nurse if you have any of these symptoms and/or any new or unusual symptoms: . Fever of 100.5 F (38 C) or higher . Chills . Easy bleeding or bruising . Trouble breathing . Feeling dizzy or lightheaded . Feeling that your heart is beating in a fast or not normal way (palpitations) . Chest pain or symptoms of a heart attack. Most heart attacks involve pain in the center of the chest that lasts more than a few minutes. The pain may go away and come back or it can be constant. It can feel like pressure, squeezing, fullness, or pain. Sometimes pain is felt in one or  both arms, the back, neck, jaw, or stomach. If any of these symptoms last 2 minutes, call 911. Marland Kitchen Confusion and/or agitation . Hallucinations . Trouble understanding or speaking . Blurry vision or changes in your eyesight . Numbness or lack of strength to your arms, legs, face, or body . Nausea that stops you from eating or drinking and/or is not relieved by prescribed medicines . Throwing up more than 3 times a day . Loose bowel movements (diarrhea) 4 times a day or loose bowel movements with lack of strength or a feeling of being dizzy . Lasting loss of appetite or rapid weight loss of five pounds in a week . Pain in your mouth or throat that makes it hard to eat or drink . Pain along the digestive tract - especially if worse after eating . Blood in your vomit (bright red or coffee-ground) and/or stools (bright red, or black/tarry) . Coughing up blood . Fatigue that interferes with your daily activities . Painful, red, or swollen areas on your hands or feet . Numbness and/or tingling of your hands and/or feet . Signs of allergic reaction: swelling of the face, feeling like your tongue or throat are swelling, trouble breathing, rash, itching, fever, chills, feeling dizzy, and/or feeling that your heart is beating in a fast or not normal way . If you think you are pregnant or may have impregnated your partner  Reproduction Warnings . Pregnancy warning: This drug may have harmful effects on the unborn baby. Women of child bearing potential should use effective methods of birth control during your cancer treatment. Let your doctor know right away if you think you may be pregnant. . Breastfeeding warning: It is not known if this drug passes into breast milk. For this reason, women should talk to their doctor about the risks and benefits of breast feeding during treatment with this drug because this drug may enter the breast milk and cause harm to a breast feeding baby. . Fertility warning:  In men and women both, this drug may affect your ability to have children in the future. Talk with your doctor or nurse if you plan to have children. Ask for information on sperm or egg banking.    Bevacizumab (Avastin)  About This Drug Bevacizumab is used to treat cancer. It is given in the vein (IV).  Possible Side Effects . Nosebleed . Headache . Teary eyes . Runny/stuffy nose . Changes in the way food and drinks taste . Bleeding in your rectum . Protein in your urine, which can affect how your kidneys work . Dry skin . A red skin rash which  can be peeling or scaling . Back pain . High Blood Pressure Note: Each of the side effects above was reported in 10% or greater of patients treated with bevacizumab. Not all possible side effects are included above.  Warnings and Precautions . Perforation or fistula- an abnormal hole in your stomach, intestine, esophagus, or other organ, which may be life-threatening . Slow wound healing . Abnormal bleeding which may be life-threatening - symptoms may be coughing up blood, throwing up blood (may look like coffee grounds), red or black tarry bowel movements, abnormally heavy menstrual flow, nosebleeds or any other unusual bleeding. . Blood clots and events such as stroke and heart attack. A blood clot in your leg may cause your leg to swell, appear red and warm, and/or cause pain. A blood clot in your lungs may cause trouble breathing, pain when breathing, and/or chest pain. . Severe high blood pressure . Changes in your central nervous system can happen. The central nervous system is made up of your brain and spinal cord. You could feel extreme tiredness, agitation, confusion, hallucinations (see or hear things that are not there), trouble understanding or speaking, loss of control of your bowels or bladder, eyesight changes, numbness or lack of strength to your arms, legs, face, or body, and coma. If you start to have any of these  symptoms let your doctor know right away. . Proteins in your urine, which can rarely cause kidney failure . While you are getting this drug in your vein (IV), you may have a reaction to the drug. Sometimes you may be given medication to stop or lessen these side effects. Your nurse will check you closely for these signs: fever or shaking chills, flushing, facial swelling, feeling dizzy, headache, trouble breathing, rash, itching, chest tightness, or chest pain. These reactions may happen for 24 hours after your infusion. If this happens, call 911 for emergency care. . In women, changes in your ovaries may happen that may cause menstrual bleeding to become irregular or stop, and may impair fertility Note: Some of the side effects above are very rare. If you have concerns and/or questions, please discuss them with your medical team.  Important Information . Bevacizumab may cause slow wound healing. It should not be given within 28 days of surgery or any test or procedure that needs conscious sedation. If you must have emergency surgery or have an accident that results in a wound, tell the doctor that you are on bevacizumab. Call your cancer doctor as soon as possible for further orders. . This drug may be present in the saliva, tears, sweat, urine, stool, vomit, semen, and vaginal secretions. Talk to your doctor and/or your nurse about the necessary precautions to take during this time.  Treating Side Effects . Keeping your pain under control is important to your well-being. Please tell your doctor or nurse if you are experiencing pain . Taking good care of your mouth may help food taste better and improve your appetite . If you have a nose bleed, sit with your head tipped slightly forward. Apply pressure by lightly pinching the bridge of your nose between your thumb and forefinger. Call your doctor if you feel dizzy or faint or if the bleeding doesn't stop after 10 to 15 minutes. . To help  with dry skin, moisturize your skin several times day . Avoid sun exposure and apply sunscreen routinely when outdoors . If you get a rash do not put anything on it unless your doctor or nurse says  you may. Keep the area around the rash clean and dry. Ask your doctor for medicine if your rash bothers you. . Infusion reactions may happen for 24 hours after your infusion. If this happens, call 911 for emergency care.  Food and Drug Interactions . There are no known interactions of bevacizumab with food . This drug may interact with other medicines. Tell your doctor and pharmacist about all the medicines and dietary supplements (vitamins, minerals, herbs and others) that you are taking at this time. The safety and use of dietary supplements and alternative diets are often not known. Using these might affect your cancer or interfere with your treatment. Until more is known, you should not use dietary supplements or alternative diets without your cancer doctor's help.  When to Call the Doctor Call your doctor or nurse if you have any of these symptoms and/or any new or unusual symptoms: . Fever of 100.5 F (38 C) or higher . Chills . Wheezing and/or trouble breathing . Difficulty swallowing . Easy bleeding or bruising . Blood in your urine, vomit (bright red or coffee-ground) and/or stools ( bright red, or black/tarry) . Coughing up blood . Confusion and/or agitation . Hallucinations . Trouble understanding or speaking . Headache that does not go away . Nose bleed that doesn't stop bleeding after 10 -15 minutes . Feeling dizzy or lightheaded . Blurry vision or changes in your eyesight . Numbness or lack of strength to your arms, legs, face, or body . Nausea that stops you from eating or drinking or relieved by prescribed medicine . Throwing up more than 3 times a day . Pain in your abdomen that does not go away . Signs of infusion reaction: fever or shaking chills, flushing, facial  swelling, feeling dizzy, headache, trouble breathing, rash, itching, chest tightness, or chest pain. . Pain that does not go away or is not relieved by prescribed medicine . Your leg or arm is swollen, red, warm and/or painful . Chest pain or symptoms of a heart attack. Most heart attacks involve pain in the center of the chest that lasts more than a few minutes. The pain may go away and come back. It can feel like pressure, squeezing, fullness, or pain. Sometimes pain is felt in one or both arms, the back, neck, jaw, or stomach. If any of these symptoms last 2 minutes, call 911. Marland Kitchen Symptoms of a stroke such as sudden numbness or weakness of your face, arm, or leg, mostly on one side of your body; sudden confusion, trouble speaking or understanding; sudden trouble seeing in one or both eyes; sudden trouble walking, feeling dizzy, loss of balance or coordination; or sudden, bad headache with no known cause. If you have any of these symptoms for 2 minutes, call 911. . If you think you may be pregnant  Reproduction Warnings . Pregnancy warning: This drug can have harmful effects on the unborn baby. Women of child bearing potential should use effective methods of birth control during your cancer treatment and for at least 6 months after treatment. Let your doctor know right away if you think you may be pregnant . Breastfeeding warning: Women should not breast feed during treatment because this drug could enter the breast milk and cause harm to a breast feeding baby. . Fertility warning: This drug may affect your ability to have children in the future. Talk with your doctor or nurse if you plan to have children. Ask for information on egg banking.   Leucovorin Calcium (Generic Name)  Other Names: Leucovorin, Wellcovorin, Folinic Acid, Citrovorum Factor  About This Drug Leucovorin is a vitamin. It is used in combination with other cancer fighting drugs such as 5-fluorouracil and methotrexate.  Leucovorin helps 5-fluorouracil kill the cancer cells. It also helps to reduce the side effects of methotrexate. Leucovorin is given in the vein (IV) or by mouth (orally).  Possible Side Effects . Rash . Wheezing Leucovorin by itself has very few side effects. Side effects you may have can be caused by the other drugs you are taking, such as 5-fluorouracil or methotrexate.  Allergic Reactions . Allergic reactions to this drug are rare. While you are getting this drug by in your vein (IV), please tell your nurse right away if you have any of these symptoms of an allergic reaction: . Trouble catching your breath . Feeling like your tongue or throat are swelling . Feeling your heart beat quickly or in a not normal way (palpitations) . Feeling dizzy or lightheaded . Flushing, itching, rash, and/or hives . If you are taking the oral form of leucovorin and you have these symptoms, do not take another dose of this drug and get urgent medical treatment.  Treating Side Effects If you get a rash do not put anything on it unless your doctor or nurse says you may. Keep the area around the rash clean and dry. Ask your doctor for medicine if your rash bothers you.  Food and Drug Interactions There are no known interactions of leucovorin with food. This drug may interact with other medicines. Tell your doctor and pharmacist about all the medicines and dietary supplements (vitamins, minerals, herbs and others) that you are taking at this time. The safety and use of dietary supplements and alternative diets are often not known. Using these might affect your cancer or interfere with your treatment. Until more is known, you should not use dietary supplements or alternative diets without your cancer doctor's help.  When to Call the Doctor . Call your doctor or nurse right away if you have any of these symptoms: . Wheezing or trouble breathing . Rash with or without itching, redness, or hives . If you  take leucovorin by mouth, please also call your doctor right away if you have: Marland Kitchen Throwing up (vomiting) . Loose bowel movements (diarrhea)  Reproduction Concerns . Pregnancy warning: It is not known if this drug may harm an unborn child. For this reason, be sure to talk with your doctor if you are pregnant or planning to become pregnant while getting this drug. . Genetic counseling is available for you to talk about the effects of this drug therapy on future pregnancies. Also, a genetic counselor can look at the possible risk of problems in the unborn baby due to this medicine if an exposure happens during pregnancy. . Breast feeding warning: It is not known if this drug passes into breast milk. For this reason, women should talk to their doctor about the risks and benefits of breast feeding during treatment with this drug because this drug may enter the breast milk and badly harm a breast feeding baby.   Oxaliplatin (Generic Name) Other Names: EloxatinTM  About This Drug Oxaliplatin is used to treat cancer. It is given in the vein (IV).  Possible Side Effects (More Common) . Effects on the nerves are called peripheral neuropathy. You may feel numbness, tingling, or pain in your hands and feet. Cold temperatures can make it worse. Avoid cold drinks with ice. Dress warmly and cover the skin  if you go out in the cold. Wear socks or gloves if you have to touch cold objects like cold flooring or items in the refrigerator or freezer. . It may be hard for you to button your clothes, open jars, or walk as usual. The effect on the nerves may get worse with more doses of the drug. These effects get better in some people after the drug is stopped but it does not get better in all people . Bone marrow depression: This is a decrease in the number of white blood cells, red blood cells, and platelets. It may increase your risk for infection, make you tired and weak (fatigue), and raise your risk of  bleeding. . Nausea and throwing up (vomiting). These symptoms may happen within a few hours after your treatment and may last up to 72 hours. Medicines are available to stop or lessen these side effects. . Electrolyte changes. Your blood will be checked for electrolyte changes as needed.  Possible Side Effects (Less Common) . Skin and tissue irritation: This may involve redness, pain, warmth, or swelling at the IV site. This happens if the drug leaks out of the vein and into nearby tissue. . Serious allergic reactions including anaphylaxis are rare. While you are getting this drug in your vein (IV), tell your nurse right away if you have any of these symptoms of an allergic reaction: . Trouble catching your breath. . Feeling like your tongue or throat are swelling. . Feeling your heart beat quickly or in a not normal way (palpitations). . Feeling dizzy or lightheaded. . Flushing, itching, rash, or hives. . Changes in your liver function. Your doctor will check your liver function as needed.  Treating Side Effects Oxaliplatin (Generic Name) continued Page 12 of 14 . Do not drink cold drinks or use ice cubes in drinks. Drink fluids at room temperature or warmer. Drink through a straw. . Wear gloves to touch cold objects. Be aware that most metals are cold to the touch, especially in winter. Examples are your car door handle and your mail box latch. . Wear warm clothing in cold weather at all times. Cover your mouth and nose with a scarf or a pulldown cap (ski cap) to warm the air that goes to your lungs. . Ask your doctor or nurse about medicine to treat nausea, throwing up, headache, or loose bowel movements. . While you are getting this drug in your vein, tell your nurse right away if you have any pain, redness, or swelling at the site of the IV infusion. . Mouth care is very important. Your mouth care should consist of routine, gentle cleaning of your teeth or dentures and rinsing your  mouth with a mixture of 1/2 teaspoon of salt in 8 ounces of water or  teaspoon of baking soda in 8 ounces of water. This should be done at least after each meal and at bedtime. . Drink 6-8 cups of fluids each day unless your doctor has told you to limit your fluid intake due to some other health problem. A cup is 8 ounces of fluid. If you throw up or have loose bowel movements you should drink more fluids so that you do not become dehydrated (lack water in the body due to losing too much fluid).  Food and Drug Interactions There are no known interactions of oxaliplatin with food. This drug may interact with other medicines. Tell your doctor and pharmacist about all the medicines and dietary supplements (vitamins, minerals, herbs and  others) that you are taking at this time. The safety and use of dietary supplements and alternative diets are often not known. Using these might affect your cancer or interfere with your treatment. Until more is known, you should not use dietary supplements or alternative diets without your cancer doctor's help.  When to Call the Doctor Call your doctor or nurse right away if you have any of these symptoms: . Fever of 100.5 F (38 C) or above . Chills . Easy bruising or bleeding . Wheezing or trouble breathing . Rash or itching . Feeling dizzy or lightheaded . Feeling that your heart is beating in a fast or not normal way (palpitations) . Loose bowel movements (diarrhea) more than 4 times a day or diarrhea with weakness or feeling lightheaded . Blurred vision or other changes in eyesight . Pain when passing urine; blood in urine . Pain in your lower back or side . Feeling confused or agitated . Nausea that stops you from eating or drinking . Throwing up more than 3 times a day . Chest pain or symptoms of a heart attack. Most heart attacks involve pain in the center of the chest that lasts more than a few minutes. The pain may go away and come back. It  can feel like pressure, squeezing, fullness, or pain. Sometimes pain is felt in one or both arms, the back, neck, jaw, or stomach. If any of these symptoms last 2 minutes, call 911. Marland Kitchen Symptoms of a stroke such as sudden numbness or weakness of your face, arm, or leg, mostly on one side of your body; sudden confusion, trouble speaking or understanding; sudden trouble seeing in one or both eyes; sudden trouble walking, feeling dizzy, loss of balance or coordination; or sudden, bad headache with no known cause. If you have any of these symptoms for 2 minutes, call 911. . Signs of liver problems: dark urine, pale bowel movements, bad stomach pain, feeling very tired and weak, itching, or yellowing of the eyes or skin. Call your doctor or nurse as soon as possible if any of these symptoms happen: . Change in hearing, ringing in the ears . Decreased urine . Unusual thirst or passing urine often . Pain in your mouth or throat that makes it hard to eat or drink . Nausea that is not relieved by prescribed medicines . Rash that is not relieved by prescribed medicines . Heavy menstrual period that lasts longer than normal . Numbness, tingling, decreased feeling or weakness in fingers, toes, arms, or legs . Trouble walking or changes in the way you walk, feeling clumsy when buttoning clothes, opening jars, or other routine hand motions . Swelling of legs, ankles, or feet . Weight gain of 5 pounds in one week (fluid retention) . Lasting loss of appetite or rapid weight loss of five pounds in a week . Fatigue that interferes with your daily activities . Headache that does not go away . Painful, red, or swollen areas on your hands or feet. . No bowel movement for 3 days or you feel uncomfortable . Extreme weakness that interferes with normal activities  Reproduction Concerns Infertility Warning: Sexual problems and reproduction concerns may happen. In both men and women, this drug may affect your  ability to have children. This cannot be determined before your treatment. Talk with your doctor or nurse if you plan to have children. Ask for information on sperm or egg banking. In men, this drug may interfere with your ability to make sperm, but  it should not change your ability to have sexual relations. In women, menstrual bleeding may become irregular or stop while you are getting this drug. Do not assume that you cannot become pregnant if you do not have a menstrual period. Women may go through signs of menopause (change of life) like vaginal dryness or itching. Vaginal lubricants can be used to lessen vaginal dryness, itching, and pain during sexual relations. Genetic counseling is available for you to talk about the effects of this drug therapy on future pregnancies. Also, a genetic counselor can look at the possible risk of problems in the unborn baby due to this medicine if an exposure happens during pregnancy. Pregnancy warning: This drug may have harmful effects on the unborn child, so effective methods of birth control should be used during your cancer treatment. Breast feeding warning: It is not known if this drug passes into breast milk. For this reason, women should talk to their doctor about the risks and benefits of breast feeding during treatment with this drug because this drug may enter the breast milk and badly harm a breast feeding baby.   SELF CARE ACTIVITIES WHILE ON CHEMOTHERAPY: Hydration Increase your fluid intake 48 hours prior to treatment and drink at least 8 to 12 cups (64 ounces) of water/decaff beverages per day after treatment. You can still have your cup of coffee or soda but these beverages do not count as part of your 8 to 12 cups that you need to drink daily. No alcohol intake.  Medications Continue taking your normal prescription medication as prescribed.  If you start any new herbal or new supplements please let us know first to make sure it is  safe.  Mouth Care Have teeth cleaned professionally before starting treatment. Keep dentures and partial plates clean. Use soft toothbrush and do not use mouthwashes that contain alcohol. Biotene is a good mouthwash that is available at most pharmacies or may be ordered by calling [phone]. Use warm salt water gargles (1 teaspoon salt per 1 quart warm water) before and after meals and at bedtime. Or you may rinse with 2 tablespoons of three-percent hydrogen peroxide mixed in eight ounces of water. If you are still having problems with your mouth or sores in your mouth please call the clinic. If you need dental work, please let the doctor know before you go for your appointment so that we can coordinate the best possible time for you in regards to your chemo regimen. You need to also let your dentist know that you are actively taking chemo. We may need to do labs prior to your dental appointment.   Skin Care Always use sunscreen that has not expired and with SPF (Sun Protection Factor) of 50 or higher. Wear hats to protect your head from the sun. Remember to use sunscreen on your hands, ears, face, & feet.  Use good moisturizing lotions such as udder cream, eucerin, or even Vaseline. Some chemotherapies can cause dry skin, color changes in your skin and nails.    . Avoid long, hot showers or baths. . Use gentle, fragrance-free soaps and laundry detergent. . Use moisturizers, preferably creams or ointments rather than lotions because the thicker consistency is better at preventing skin dehydration. Apply the cream or ointment within 15 minutes of showering. Reapply moisturizer at night, and moisturize your hands every time after you wash them.  Hair Loss (if your doctor says your hair will fall out)  . If your doctor says that your hair  is likely to fall out, decide before you begin chemo whether you want to wear a wig. You may want to shop before treatment to match your hair color. . Hats,  turbans, and scarves can also camouflage hair loss, although some people prefer to leave their heads uncovered. If you go bare-headed outdoors, be sure to use sunscreen on your scalp. . Cut your hair short. It eases the inconvenience of shedding lots of hair, but it also can reduce the emotional impact of watching your hair fall out. . Don't perm or color your hair during chemotherapy. Those chemical treatments are already damaging to hair and can enhance hair loss. Once your chemo treatments are done and your hair has grown back, it's OK to resume dyeing or perming hair. With chemotherapy, hair loss is almost always temporary. But when it grows back, it may be a different color or texture. In older adults who still had hair color before chemotherapy, the new growth may be completely gray.  Often, new hair is very fine and soft.  Infection Prevention Please wash your hands for at least 30 seconds using warm soapy water. Handwashing is the #1 way to prevent the spread of germs. Stay away from sick people or people who are getting over a cold. If you develop respiratory systems such as green/yellow mucus production or productive cough or persistent cough let us know and we will see if you need an antibiotic. It is a good idea to keep a pair of gloves on when going into grocery stores/[employer] to decrease your risk of coming into contact with germs on the carts, etc. Carry alcohol hand gel with you at all times and use it frequently if out in public. If your temperature reaches 100.5 or higher please call the clinic and let us know.  If it is after hours or on the weekend please go to the ER if your temperature is over 100.5.  Please have your own personal thermometer at home to use.    Sex and bodily fluids If you are going to have sex, a condom must be used to protect the person that isn't taking chemotherapy. Chemo can decrease your libido (sex drive). For a few days after chemotherapy, chemotherapy can be  excreted through your bodily fluids.  When using the toilet please close the lid and flush the toilet twice.  Do this for a few day after you have had chemotherapy.     Effects of chemotherapy on your sex life Some changes are simple and won't last long. They won't affect your sex life permanently. Sometimes you may feel: . too tired . not strong enough to be very active . sick or sore  . not in the mood . anxious or low Your anxiety might not seem related to sex. For example, you may be worried about the cancer and how your treatment is going. Or you may be worried about money, or about how you family are coping with your illness. These things can cause stress, which can affect your interest in sex. It's important to talk to your partner about how you feel. Remember - the changes to your sex life don't usually last long. There's usually no medical reason to stop having sex during chemo. The drugs won't have any long term physical effects on your performance or enjoyment of sex. Cancer can't be passed on to your partner during sex  Contraception It's important to use reliable contraception during treatment. Avoid getting pregnant while you or  your partner are having chemotherapy. This is because the drugs may harm the baby. Sometimes chemotherapy drugs can leave a man or woman infertile.  This means you would not be able to have children in the future. You might want to talk to someone about permanent infertility. It can be very difficult to learn that you may no longer be able to have children. Some people find counselling helpful. There might be ways to preserve your fertility, although this is easier for men than for women. You may want to speak to a fertility expert. You can talk about sperm banking or harvesting your eggs. You can also ask about other fertility options, such as donor eggs. If you have or have had breast cancer, your doctor might advise you not to take the contraceptive pill.  This is because the hormones in it might affect the cancer.  It is not known for sure whether or not chemotherapy drugs can be passed on through semen or secretions from the vagina. Because of this some doctors advise people to use a barrier method if you have sex during treatment. This applies to vaginal, anal or oral sex. Generally, doctors advise a barrier method only for the time you are actually having the treatment and for about a week after your treatment. Advice like this can be worrying, but this does not mean that you have to avoid being intimate with your partner. You can still have close contact with your partner and continue to enjoy sex.  Animals If you have cats or birds we just ask that you not change the litter or change the cage.  Please have someone else do this for you while you are on chemotherapy.   Food Safety During and After Cancer Treatment Food safety is important for people both during and after cancer treatment. Cancer and cancer treatments, such as chemotherapy, radiation therapy, and stem cell/bone marrow transplantation, often weaken the immune system. This makes it harder for your body to protect itself from foodborne illness, also called food poisoning. Foodborne illness is caused by eating food that contains harmful bacteria, parasites, or viruses.  Foods to avoid Some foods have a higher risk of becoming tainted with bacteria. These include: Marland Kitchen Unwashed fresh fruit and vegetables, especially leafy vegetables that can hide dirt and other contaminants . Raw sprouts, such as alfalfa sprouts . Raw or undercooked beef, especially ground beef, or other raw or undercooked meat and poultry . Fatty, fried, or spicy foods immediately before or after treatment.  These can sit heavy on your stomach and make you feel nauseous. . Raw or undercooked shellfish, such as oysters. . Sushi and sashimi, which often contain raw fish.  . Unpasteurized beverages, such as unpasteurized  fruit juices, raw milk, raw yogurt, or cider . Undercooked eggs, such as soft boiled, over easy, and poached; raw, unpasteurized eggs; or foods made with raw egg, such as homemade raw cookie dough and homemade mayonnaise Simple steps for food safety Shop smart. . Do not buy food stored or displayed in an unclean area. . Do not buy bruised or damaged fruits or vegetables. . Do not buy cans that have cracks, dents, or bulges. . Pick up foods that can spoil at the end of your shopping trip and store them in a cooler on the way home. Prepare and clean up foods carefully. . Rinse all fresh fruits and vegetables under running water, and dry them with a clean towel or paper towel. . Clean the top of  cans before opening them. . After preparing food, wash your hands for 20 seconds with hot water and soap. Pay special attention to areas between fingers and under nails. . Clean your utensils and dishes with hot water and soap. Marland Kitchen Disinfect your kitchen and cutting boards using 1 teaspoon of liquid, unscented bleach mixed into 1 quart of water.   Dispose of old food. . Eat canned and packaged food before its expiration date (the "use by" or "best before" date). . Consume refrigerated leftovers within 3 to 4 days. After that time, throw out the food. Even if the food does not smell or look spoiled, it still may be unsafe. Some bacteria, such as Listeria, can grow even on foods stored in the refrigerator if they are kept for too long. Take precautions when eating out. . At restaurants, avoid buffets and salad bars where food sits out for a long time and comes in contact with many people. Food can become contaminated when someone with a virus, often a norovirus, or another "bug" handles it. . Put any leftover food in a "to-go" container yourself, rather than having the server do it. And, refrigerate leftovers as soon as you get home. . Choose restaurants that are clean and that are willing to prepare your food  as you order it cooked.    MEDICATIONS: Dexamethasone 4mg  tablet.  Take 2 tablets (8 mg total) by mouth daily. Start the day after chemotherapy for 2 days. Take with food.  Take with food.                                                                                                                                                               Zofran/Ondansetron 8mg  tablet. Take 1 tablet every 8 hours as needed for nausea/vomiting. (#1 nausea med to take, this can constipate)  Compazine/Prochlorperazine 10mg  tablet. Take 1 tablet every 6 hours as needed for nausea/vomiting. (#2 nausea med to take, this can make you sleepy)   EMLA cream. Apply a quarter size amount to port site 1 hour prior to chemo. Do not rub in. Cover with plastic wrap.   Over-the-Counter Meds:  Miralax 1 capful in 8 oz of fluid daily. May increase to two times a day if needed. This is a stool softener. If this doesn't work proceed you can add:  Senokot S-start with 1 tablet two times a day and increase to 4 tablets two times a day if needed. (total of 8 tablets in a 24 hour period). This is a stimulant laxative.   Call us if this does not help your bowels move.   Imodium 2mg  capsule. Take 2 capsules after the 1st loose stool and then 1 capsule every 2 hours until you go a total of 12 hours without having a  loose stool. Call the [facility] if loose stools continue. If diarrhea occurs @ bedtime, take 2 capsules @ bedtime. Then take 2 capsules every 4 hours until morning. Call [facility].     Constipation Sheet *Miralax in 8 oz of fluid daily.  May increase to two times a day if needed.  This is a stool softener.  If this not enough to keep your bowel regular:  You can add:  *Senokot S, start with one tablet twice a day and can increase to 4 tablets twice a day if needed.  This is a stimulant laxative.   Sometimes when you take pain medication you need BOTH a medicine to keep your stool soft and a medicine  to help your bowel push it out!  Please call if the above does not work for you.   Do not go more than 2 days without a bowel movement.  It is very important that you do not become constipated.  It will make you feel sick to your stomach (nausea) and can cause abdominal pain and vomiting.    Diarrhea Sheet  If you are having loose stools/diarrhea, please purchase Imodium and begin taking as outlined:  At the first sign of poorly formed or loose stools you should begin taking Imodium(loperamide) 2 mg capsules.  Take two caplets (4mg ) followed by one caplet (2mg ) every 2 hours until you have had no diarrhea for 12 hours.  During the night take two caplets (4mg ) at bedtime and continue every 4 hours during the night until the morning.  Stop taking Imodium only after there is no sign of diarrhea for 12 hours.    Always call the [facility] if you are having loose stools/diarrhea that you can't get under control.  Loose stools/disrrhea leads to dehydration (loss of water) in your body.  We have other options of trying to get the loose stools/diarrhea to stopped but you must let us know!    Nausea Sheet  Zofran/Ondansetron 8mg  tablet. Take 1 tablet every 8 hours as needed for nausea/vomiting. (#1 nausea med to take, this can constipate)  Compazine/Prochlorperazine 10mg  tablet. Take 1 tablet every 6 hours as needed for nausea/vomiting. (#2 nausea med to take, this can make you sleepy)  You can take these medications together or separately.  We would first like for you to try the Ondansetron by itself and then take the Prochloperizine if needed. But you are allowed to take both medications at the same time if your nausea is that severe.  If you are having persistent nausea (nausea that does not stop) please take these medications on a staggered schedule so that the nausea medication stays in your body.  Please call the [facility] and let us know the amount of nausea that you are experiencing.  If  you begin to vomit, you need to call the [facility] and if it is the weekend and you have vomited more than one time and cant get it to stop-go to the Emergency Room.  Persistent nausea/vomiting can lead to dehydration (loss of fluid in your body) and will make you feel terrible.   Ice chips, sips of clear liquids, foods that are @ room temperature, crackers, and toast tend to be better tolerated.     SYMPTOMS TO REPORT AS SOON AS POSSIBLE AFTER TREATMENT:  FEVER GREATER THAN 100.5 F  CHILLS WITH OR WITHOUT FEVER  NAUSEA AND VOMITING THAT IS NOT CONTROLLED WITH YOUR NAUSEA MEDICATION  UNUSUAL SHORTNESS OF BREATH  UNUSUAL BRUISING OR BLEEDING  TENDERNESS IN MOUTH AND THROAT WITH OR WITHOUT PRESENCE OF ULCERS  URINARY PROBLEMS  BOWEL PROBLEMS  UNUSUAL RASH    Wear comfortable clothing and clothing appropriate for easy access to any Portacath or PICC line. Let us know if there is anything that we can do to make your therapy better!     What to do if you need assistance after hours or on the weekends: CALL [phone].  HOLD on the line, do not hang up.  You will hear multiple messages but at the end you will be connected with a nurse triage line.  They will contact the doctor if necessary.  Most of the time they will be able to assist you.  Do not call the hospital operator.       I have been informed and understand all of the instructions given to me and have received a copy. I have been instructed to call the clinic [phone] or my family physician as soon as possible for continued medical care, if indicated. I do not have any more questions at this time but understand that I may call the [facility] or the Patient Navigator at [phone] during office hours should I have questions or need assistance in obtaining follow-up care.

## 2017-02-04 ENCOUNTER — Other Ambulatory Visit (HOSPITAL_COMMUNITY): Payer: Self-pay

## 2017-02-04 ENCOUNTER — Telehealth (HOSPITAL_COMMUNITY): Payer: Self-pay | Admitting: Emergency Medicine

## 2017-02-04 ENCOUNTER — Encounter (HOSPITAL_COMMUNITY): Payer: Self-pay | Admitting: Emergency Medicine

## 2017-02-04 DIAGNOSIS — I2692 Saddle embolus of pulmonary artery without acute cor pulmonale: Secondary | ICD-10-CM

## 2017-02-04 MED ORDER — ENOXAPARIN SODIUM 150 MG/ML ~~LOC~~ SOLN: 140.0000 mg | SUBCUTANEOUS | 1 refills | Status: DC

## 2017-02-04 NOTE — Telephone Encounter (Signed)
Attempted to call back 4/3 PM and phone line was busy. Attempted to call 4/4 am and was unable to speak with a live person. Left message on the provider line for them to call back if they still had questions.

## 2017-02-04 NOTE — Telephone Encounter (Signed)
Pt called and stated that Xarelto was $412 and she could not afford that.  Now the lovenox is $500 she can not afford that.  [facility] to [facility] it all.  She does not understand why she can not just stop taking the coumadin 5 days prior and then restart it after like she has with the colonoscopy and biopsy.  Spoke with Kirby Crigler PA and pt to stop coumadin 5 days prior to port placement and restart 1 days after since she refuses any other options.

## 2017-02-04 NOTE — Progress Notes (Signed)
Chemotherapy teaching pulled together and appts made. 

## 2017-02-05 ENCOUNTER — Encounter (HOSPITAL_COMMUNITY): Payer: Medicare Other | Attending: Hematology & Oncology

## 2017-02-05 ENCOUNTER — Encounter (HOSPITAL_COMMUNITY): Payer: Self-pay

## 2017-02-05 ENCOUNTER — Encounter (HOSPITAL_COMMUNITY)
Admission: RE | Admit: 2017-02-05 | Discharge: 2017-02-05 | Disposition: A | Discharge status: Home / Self Care | Payer: Medicare Other | Source: Ambulatory Visit | Attending: General Surgery | Admitting: General Surgery

## 2017-02-05 DIAGNOSIS — D500 Iron deficiency anemia secondary to blood loss (chronic): Secondary | ICD-10-CM | POA: Insufficient documentation

## 2017-02-05 DIAGNOSIS — C182 Malignant neoplasm of ascending colon: Secondary | ICD-10-CM | POA: Insufficient documentation

## 2017-02-05 DIAGNOSIS — C189 Malignant neoplasm of colon, unspecified: Secondary | ICD-10-CM | POA: Diagnosis present

## 2017-02-05 DIAGNOSIS — Z87891 Personal history of nicotine dependence: Secondary | ICD-10-CM | POA: Insufficient documentation

## 2017-02-05 DIAGNOSIS — C787 Secondary malignant neoplasm of liver and intrahepatic bile duct: Secondary | ICD-10-CM | POA: Diagnosis present

## 2017-02-05 DIAGNOSIS — Z79899 Other long term (current) drug therapy: Secondary | ICD-10-CM | POA: Insufficient documentation

## 2017-02-05 HISTORY — DX: Depression, unspecified: F32.A

## 2017-02-05 HISTORY — DX: Major depressive disorder, single episode, unspecified: F32.9

## 2017-02-05 LAB — CBC WITH DIFFERENTIAL/PLATELET
BASOS ABS: 0 10*3/uL (ref 0.0–0.1)
BASOS PCT: 0 %
Eosinophils Absolute: 0.1 10*3/uL (ref 0.0–0.7)
Eosinophils Relative: 1 %
HEMATOCRIT: 39.5 % (ref 36.0–46.0)
HEMOGLOBIN: 12.5 g/dL (ref 12.0–15.0)
Lymphocytes Relative: 20 %
Lymphs Abs: 1.9 10*3/uL (ref 0.7–4.0)
MCH: 28.3 pg (ref 26.0–34.0)
MCHC: 31.6 g/dL (ref 30.0–36.0)
MCV: 89.4 fL (ref 78.0–100.0)
MONO ABS: 0.8 10*3/uL (ref 0.1–1.0)
Monocytes Relative: 8 %
NEUTROS ABS: 6.6 10*3/uL (ref 1.7–7.7)
NEUTROS PCT: 71 %
Platelets: 286 10*3/uL (ref 150–400)
RBC: 4.42 MIL/uL (ref 3.87–5.11)
RDW: 14.9 % (ref 11.5–15.5)
WBC: 9.3 10*3/uL (ref 4.0–10.5)

## 2017-02-05 LAB — BASIC METABOLIC PANEL
ANION GAP: 12 (ref 5–15)
BUN: 14 mg/dL (ref 6–20)
CALCIUM: 8.9 mg/dL (ref 8.9–10.3)
CO2: 24 mmol/L (ref 22–32)
Chloride: 98 mmol/L — ABNORMAL LOW (ref 101–111)
Creatinine, Ser: 0.62 mg/dL (ref 0.44–1.00)
Glucose, Bld: 303 mg/dL — ABNORMAL HIGH (ref 65–99)
POTASSIUM: 4 mmol/L (ref 3.5–5.1)
SODIUM: 134 mmol/L — AB (ref 135–145)

## 2017-02-05 LAB — PROTIME-INR
INR: 2.22
Prothrombin Time: 25 seconds — ABNORMAL HIGH (ref 11.4–15.2)

## 2017-02-05 NOTE — Progress Notes (Signed)
Consent signed. Chemotherapy teaching complete.  Extensive teaching packet given.

## 2017-02-06 ENCOUNTER — Ambulatory Visit (HOSPITAL_COMMUNITY)
Admission: RE | Admit: 2017-02-06 | Discharge: 2017-02-06 | Disposition: A | Discharge status: Home / Self Care | Payer: Medicare Other | Source: Ambulatory Visit | Attending: Hematology | Admitting: Hematology

## 2017-02-06 DIAGNOSIS — C787 Secondary malignant neoplasm of liver and intrahepatic bile duct: Secondary | ICD-10-CM | POA: Insufficient documentation

## 2017-02-06 DIAGNOSIS — C189 Malignant neoplasm of colon, unspecified: Secondary | ICD-10-CM

## 2017-02-06 MED ORDER — IOPAMIDOL (ISOVUE-300) INJECTION 61%
75.0000 mL | Freq: Once | INTRAVENOUS | Status: AC | PRN
  Administered 2017-02-06: 75 mL via INTRAVENOUS

## 2017-02-06 NOTE — Pre-Procedure Instructions (Signed)
PT/INR and glucose sown to Dr Patsey Berthold. We will repeat PT/INR and met 7 on arrival 02/09/2017.

## 2017-02-09 ENCOUNTER — Ambulatory Visit (HOSPITAL_COMMUNITY): Payer: Medicare Other | Admitting: Anesthesiology

## 2017-02-09 ENCOUNTER — Ambulatory Visit (HOSPITAL_COMMUNITY): Payer: Medicare Other

## 2017-02-09 ENCOUNTER — Ambulatory Visit (HOSPITAL_COMMUNITY)
Admission: RE | Admit: 2017-02-09 | Discharge: 2017-02-09 | Disposition: A | Discharge status: Home / Self Care | Payer: Medicare Other | Source: Ambulatory Visit | Attending: General Surgery | Admitting: General Surgery

## 2017-02-09 ENCOUNTER — Encounter (HOSPITAL_COMMUNITY)
Admission: RE | Disposition: A | Discharge status: Home / Self Care | Payer: Self-pay | Source: Ambulatory Visit | Attending: General Surgery

## 2017-02-09 ENCOUNTER — Encounter (HOSPITAL_COMMUNITY): Payer: Self-pay

## 2017-02-09 DIAGNOSIS — Z8249 Family history of ischemic heart disease and other diseases of the circulatory system: Secondary | ICD-10-CM | POA: Diagnosis not present

## 2017-02-09 DIAGNOSIS — Z7901 Long term (current) use of anticoagulants: Secondary | ICD-10-CM | POA: Diagnosis not present

## 2017-02-09 DIAGNOSIS — Z808 Family history of malignant neoplasm of other organs or systems: Secondary | ICD-10-CM | POA: Insufficient documentation

## 2017-02-09 DIAGNOSIS — I700 Atherosclerosis of aorta: Secondary | ICD-10-CM | POA: Insufficient documentation

## 2017-02-09 DIAGNOSIS — C787 Secondary malignant neoplasm of liver and intrahepatic bile duct: Secondary | ICD-10-CM | POA: Diagnosis not present

## 2017-02-09 DIAGNOSIS — Z818 Family history of other mental and behavioral disorders: Secondary | ICD-10-CM | POA: Insufficient documentation

## 2017-02-09 DIAGNOSIS — Z9889 Other specified postprocedural states: Secondary | ICD-10-CM | POA: Insufficient documentation

## 2017-02-09 DIAGNOSIS — E7800 Pure hypercholesterolemia, unspecified: Secondary | ICD-10-CM | POA: Diagnosis not present

## 2017-02-09 DIAGNOSIS — I10 Essential (primary) hypertension: Secondary | ICD-10-CM | POA: Diagnosis not present

## 2017-02-09 DIAGNOSIS — Z87891 Personal history of nicotine dependence: Secondary | ICD-10-CM | POA: Insufficient documentation

## 2017-02-09 DIAGNOSIS — Z800 Family history of malignant neoplasm of digestive organs: Secondary | ICD-10-CM | POA: Insufficient documentation

## 2017-02-09 DIAGNOSIS — C189 Malignant neoplasm of colon, unspecified: Secondary | ICD-10-CM | POA: Insufficient documentation

## 2017-02-09 DIAGNOSIS — Z9049 Acquired absence of other specified parts of digestive tract: Secondary | ICD-10-CM | POA: Insufficient documentation

## 2017-02-09 DIAGNOSIS — Z95828 Presence of other vascular implants and grafts: Secondary | ICD-10-CM

## 2017-02-09 DIAGNOSIS — C182 Malignant neoplasm of ascending colon: Secondary | ICD-10-CM | POA: Diagnosis not present

## 2017-02-09 DIAGNOSIS — J449 Chronic obstructive pulmonary disease, unspecified: Secondary | ICD-10-CM | POA: Insufficient documentation

## 2017-02-09 DIAGNOSIS — Z79899 Other long term (current) drug therapy: Secondary | ICD-10-CM | POA: Insufficient documentation

## 2017-02-09 DIAGNOSIS — D509 Iron deficiency anemia, unspecified: Secondary | ICD-10-CM | POA: Diagnosis not present

## 2017-02-09 HISTORY — PX: PORTACATH PLACEMENT: SHX2246

## 2017-02-09 LAB — BASIC METABOLIC PANEL
Anion gap: 12 (ref 5–15)
BUN: 13 mg/dL (ref 6–20)
CALCIUM: 9 mg/dL (ref 8.9–10.3)
CO2: 26 mmol/L (ref 22–32)
CREATININE: 0.71 mg/dL (ref 0.44–1.00)
Chloride: 98 mmol/L — ABNORMAL LOW (ref 101–111)
GFR calc Af Amer: >60 mL/min (ref >60)
GFR calc non Af Amer: >60 mL/min (ref >60)
GLUCOSE: 215 mg/dL — AB (ref 65–99)
Potassium: 4.2 mmol/L (ref 3.5–5.1)
Sodium: 136 mmol/L (ref 135–145)

## 2017-02-09 SURGERY — INSERTION, TUNNELED CENTRAL VENOUS DEVICE, WITH PORT
Anesthesia: Monitor Anesthesia Care | Site: Chest | Laterality: Right

## 2017-02-09 MED ORDER — LIDOCAINE HCL (PF) 1 % IJ SOLN
INTRAMUSCULAR | Status: DC | PRN
  Administered 2017-02-09: 19 mL

## 2017-02-09 MED ORDER — MIDAZOLAM HCL 2 MG/2ML IJ SOLN
1.0000 mg | INTRAMUSCULAR | Status: AC
Start: 2017-02-09 — End: 2017-02-09
  Administered 2017-02-09 (×2): 2 mg via INTRAVENOUS
  Filled 2017-02-09: qty 2

## 2017-02-09 MED ORDER — LACTATED RINGERS IV SOLN
INTRAVENOUS | Status: DC
  Administered 2017-02-09: 09:00:00 via INTRAVENOUS

## 2017-02-09 MED ORDER — MIDAZOLAM HCL 2 MG/2ML IJ SOLN
INTRAMUSCULAR | Status: AC
  Filled 2017-02-09: qty 2

## 2017-02-09 MED ORDER — PROPOFOL 500 MG/50ML IV EMUL
INTRAVENOUS | Status: DC | PRN
  Administered 2017-02-09: 50 ug/kg/min via INTRAVENOUS

## 2017-02-09 MED ORDER — HEPARIN SOD (PORK) LOCK FLUSH 100 UNIT/ML IV SOLN
INTRAVENOUS | Status: DC | PRN
  Administered 2017-02-09: 5 [IU] via INTRAVENOUS

## 2017-02-09 MED ORDER — ONDANSETRON HCL 4 MG/2ML IJ SOLN
INTRAMUSCULAR | Status: AC
  Filled 2017-02-09: qty 2

## 2017-02-09 MED ORDER — KETOROLAC TROMETHAMINE 30 MG/ML IJ SOLN
30.0000 mg | Freq: Once | INTRAMUSCULAR | Status: AC
  Administered 2017-02-09: 30 mg via INTRAVENOUS
  Filled 2017-02-09: qty 1

## 2017-02-09 MED ORDER — CHLORHEXIDINE GLUCONATE CLOTH 2 % EX PADS: 6.0000 | MEDICATED_PAD | Freq: Once | CUTANEOUS | Status: DC

## 2017-02-09 MED ORDER — SODIUM CHLORIDE 0.9 % IV SOLN
INTRAVENOUS | Status: DC | PRN
  Administered 2017-02-09: 100 mL via INTRAMUSCULAR

## 2017-02-09 MED ORDER — CEFAZOLIN SODIUM-DEXTROSE 2-4 GM/100ML-% IV SOLN
INTRAVENOUS | Status: AC
  Filled 2017-02-09: qty 100

## 2017-02-09 MED ORDER — HYDROCODONE-ACETAMINOPHEN 5-325 MG PO TABS: 1.0000 | ORAL_TABLET | Freq: Four times a day (QID) | ORAL | 0 refills | Status: DC | PRN

## 2017-02-09 MED ORDER — FENTANYL CITRATE (PF) 100 MCG/2ML IJ SOLN
INTRAMUSCULAR | Status: AC
  Filled 2017-02-09: qty 2

## 2017-02-09 MED ORDER — CEFAZOLIN SODIUM-DEXTROSE 2-4 GM/100ML-% IV SOLN
2.0000 g | INTRAVENOUS | Status: AC
  Administered 2017-02-09: 2 g via INTRAVENOUS

## 2017-02-09 MED ORDER — HEPARIN SOD (PORK) LOCK FLUSH 100 UNIT/ML IV SOLN
INTRAVENOUS | Status: AC
  Filled 2017-02-09: qty 5

## 2017-02-09 MED ORDER — FENTANYL CITRATE (PF) 100 MCG/2ML IJ SOLN
25.0000 ug | INTRAMUSCULAR | Status: AC
  Administered 2017-02-09: 25 ug via INTRAVENOUS

## 2017-02-09 MED ORDER — LIDOCAINE HCL (PF) 1 % IJ SOLN
INTRAMUSCULAR | Status: AC
  Filled 2017-02-09: qty 30

## 2017-02-09 MED ORDER — MIDAZOLAM HCL 5 MG/5ML IJ SOLN
INTRAMUSCULAR | Status: DC | PRN
  Administered 2017-02-09: 2 mg via INTRAVENOUS

## 2017-02-09 MED ORDER — LACTATED RINGERS IV SOLN
INTRAVENOUS | Status: DC | PRN
  Administered 2017-02-09: 08:00:00 via INTRAVENOUS

## 2017-02-09 SURGICAL SUPPLY — 36 items
APPLIER CLIP 9.375 SM OPEN (CLIP)
BAG DECANTER FOR FLEXI CONT (MISCELLANEOUS) ×3 IMPLANT
BAG HAMPER (MISCELLANEOUS) ×3 IMPLANT
CATH HICKMAN DUAL 12.0 (CATHETERS) ×3 IMPLANT
CHLORAPREP W/TINT 10.5 ML (MISCELLANEOUS) ×3 IMPLANT
CLIP APPLIE 9.375 SM OPEN (CLIP) IMPLANT
CLOTH BEACON ORANGE TIMEOUT ST (SAFETY) ×3 IMPLANT
COVER FOOT RIGID SOLE IMPAD (MISCELLANEOUS) IMPLANT
COVER LIGHT HANDLE STERIS (MISCELLANEOUS) ×6 IMPLANT
DECANTER SPIKE VIAL GLASS SM (MISCELLANEOUS) ×3 IMPLANT
DERMABOND ADVANCED (GAUZE/BANDAGES/DRESSINGS) ×2
DERMABOND ADVANCED .7 DNX12 (GAUZE/BANDAGES/DRESSINGS) ×1 IMPLANT
DRAPE C-ARM FOLDED MOBILE STRL (DRAPES) ×3 IMPLANT
ELECT REM PT RETURN 9FT ADLT (ELECTROSURGICAL) ×3
ELECTRODE REM PT RTRN 9FT ADLT (ELECTROSURGICAL) ×1 IMPLANT
GLOVE BIOGEL PI IND STRL 7.0 (GLOVE) ×1 IMPLANT
GLOVE BIOGEL PI INDICATOR 7.0 (GLOVE) ×2
GLOVE SURG SS PI 7.5 STRL IVOR (GLOVE) ×12 IMPLANT
GOWN STRL REUS W/TWL LRG LVL3 (GOWN DISPOSABLE) ×6 IMPLANT
IV NS 500ML (IV SOLUTION) ×2
IV NS 500ML BAXH (IV SOLUTION) ×1 IMPLANT
KIT PORT POWER 8FR ISP MRI (Port) ×3 IMPLANT
KIT ROOM TURNOVER APOR (KITS) ×3 IMPLANT
MANIFOLD NEPTUNE II (INSTRUMENTS) ×3 IMPLANT
NEEDLE HYPO 25X1 1.5 SAFETY (NEEDLE) ×3 IMPLANT
PACK MINOR (CUSTOM PROCEDURE TRAY) ×3 IMPLANT
PAD ARMBOARD 7.5X6 YLW CONV (MISCELLANEOUS) ×3 IMPLANT
SET BASIN LINEN APH (SET/KITS/TRAYS/PACK) ×3 IMPLANT
SET INTRODUCER 12FR PACEMAKER (SHEATH) IMPLANT
SHEATH COOK PEEL AWAY SET 8F (SHEATH) IMPLANT
SUT PROLENE 3 0 PS 2 (SUTURE) IMPLANT
SUT VIC AB 3-0 SH 27 (SUTURE) ×2
SUT VIC AB 3-0 SH 27X BRD (SUTURE) ×1 IMPLANT
SUT VIC AB 4-0 PS2 27 (SUTURE) ×3 IMPLANT
SYR 20CC LL (SYRINGE) ×3 IMPLANT
SYR CONTROL 10ML LL (SYRINGE) ×3 IMPLANT

## 2017-02-09 NOTE — Anesthesia Postprocedure Evaluation (Signed)
Anesthesia Post Note  Patient: Janet Hanson  Procedure(s) Performed: Procedure(s) (LRB): INSERTION PORT-A-CATH (N/A)  Patient location during evaluation: PACU Anesthesia Type: MAC Level of consciousness: awake and alert Pain management: pain level controlled Vital Signs Assessment: post-procedure vital signs reviewed and stable Respiratory status: spontaneous breathing Cardiovascular status: stable Postop Assessment: no signs of nausea or vomiting Anesthetic complications: no     Last Vitals:  Vitals:   02/09/17 0955 02/09/17 1000  BP:  (!) 145/91  Pulse:    Resp: (!) 24 (!) 21  Temp:      Last Pain:  Vitals:   02/09/17 0823  TempSrc: Oral  PainSc: 0-No pain                 ADAMS, AMY A

## 2017-02-09 NOTE — Anesthesia Preprocedure Evaluation (Signed)
Anesthesia Evaluation  Patient identified by MRN, date of birth, ID band Patient awake    Reviewed: Allergy & Precautions, NPO status , Patient's Chart, lab work & pertinent test results  Airway Mallampati: III  TM Distance: >3 FB Neck ROM: Full    Dental  (+) Missing, Poor Dentition   Pulmonary former smoker,    breath sounds clear to auscultation (-) decreased breath sounds      Cardiovascular hypertension, Pt. on medications  Rhythm:Regular Rate:Normal     Neuro/Psych PSYCHIATRIC DISORDERS Anxiety Depression negative neurological ROS     GI/Hepatic Neg liver ROS, Metastatic colon cancer    Endo/Other  Hypercholesterolemia  Renal/GU negative Renal ROS     Musculoskeletal   Abdominal   Peds  Hematology   Anesthesia Other Findings   Reproductive/Obstetrics                             Anesthesia Physical Anesthesia Plan  ASA: III  Anesthesia Plan: MAC   Post-op Pain Management:    Induction: Intravenous  Airway Management Planned: Simple Face Mask  Additional Equipment:   Intra-op Plan:   Post-operative Plan:   Informed Consent: I have reviewed the patients History and Physical, chart, labs and discussed the procedure including the risks, benefits and alternatives for the proposed anesthesia with the patient or authorized representative who has indicated his/her understanding and acceptance.     Plan Discussed with:   Anesthesia Plan Comments:         Anesthesia Quick Evaluation

## 2017-02-09 NOTE — Anesthesia Procedure Notes (Signed)
Procedure Name: MAC Date/Time: 02/09/2017 10:34 AM Performed by: Andree Elk, AMY A Pre-anesthesia Checklist: Patient identified, Timeout performed, Emergency Drugs available, Suction available and Patient being monitored Oxygen Delivery Method: Simple face mask

## 2017-02-09 NOTE — Discharge Instructions (Signed)
Implanted Port Home Guide °An implanted port is a type of central line that is placed under the skin. Central lines are used to provide IV access when treatment or nutrition needs to be given through a person’s veins. Implanted ports are used for long-term IV access. An implanted port may be placed because: °· You need IV medicine that would be irritating to the small veins in your hands or arms. °· You need long-term IV medicines, such as antibiotics. °· You need IV nutrition for a long period. °· You need frequent blood draws for lab tests. °· You need dialysis. ° °Implanted ports are usually placed in the chest area, but they can also be placed in the upper arm, the abdomen, or the leg. An implanted port has two main parts: °· Reservoir. The reservoir is round and will appear as a small, raised area under your skin. The reservoir is the part where a needle is inserted to give medicines or draw blood. °· Catheter. The catheter is a thin, flexible tube that extends from the reservoir. The catheter is placed into a large vein. Medicine that is inserted into the reservoir goes into the catheter and then into the vein. ° °How will I care for my incision site? °Do not get the incision site wet. Bathe or shower as directed by your health care provider. °How is my port accessed? °Special steps must be taken to access the port: °· Before the port is accessed, a numbing cream can be placed on the skin. This helps numb the skin over the port site. °· Your health care provider uses a sterile technique to access the port. °? Your health care provider must put on a mask and sterile gloves. °? The skin over your port is cleaned carefully with an antiseptic and allowed to dry. °? The port is gently pinched between sterile gloves, and a needle is inserted into the port. °· Only "non-coring" port needles should be used to access the port. Once the port is accessed, a blood return should be checked. This helps ensure that the port  is in the vein and is not clogged. °· If your port needs to remain accessed for a constant infusion, a clear (transparent) bandage will be placed over the needle site. The bandage and needle will need to be changed every week, or as directed by your health care provider. °· Keep the bandage covering the needle clean and dry. Do not get it wet. Follow your health care provider’s instructions on how to take a shower or bath while the port is accessed. °· If your port does not need to stay accessed, no bandage is needed over the port. ° °What is flushing? °Flushing helps keep the port from getting clogged. Follow your health care provider’s instructions on how and when to flush the port. Ports are usually flushed with saline solution or a medicine called heparin. The need for flushing will depend on how the port is used. °· If the port is used for intermittent medicines or blood draws, the port will need to be flushed: °? After medicines have been given. °? After blood has been drawn. °? As part of routine maintenance. °· If a constant infusion is running, the port may not need to be flushed. ° °How long will my port stay implanted? °The port can stay in for as long as your health care provider thinks it is needed. When it is time for the port to come out, surgery will be   done to remove it. The procedure is similar to the one performed when the port was put in. °When should I seek immediate medical care? °When you have an implanted port, you should seek immediate medical care if: °· You notice a bad smell coming from the incision site. °· You have swelling, redness, or drainage at the incision site. °· You have more swelling or pain at the port site or the surrounding area. °· You have a fever that is not controlled with medicine. ° °This information is not intended to replace advice given to you by your health care provider. Make sure you discuss any questions you have with your health care provider. °Document  Released: 10/20/2005 Document Revised: 03/27/2016 Document Reviewed: 06/27/2013 °[employer]vier Interactive Patient Education © 2017 [employer] °Implanted Port Insertion, Care After °This sheet gives you information about how to care for yourself after your procedure. Your health care provider may also give you more specific instructions. If you have problems or questions, contact your health care provider. °What can I expect after the procedure? °After your procedure, it is common to have: °· Discomfort at the port insertion site. °· Bruising on the skin over the port. This should improve over 3-4 days. ° °Follow these instructions at home: °Port care °· After your port is placed, you will get a manufacturer's information card. The card has information about your port. Keep this card with you at all times. °· Take care of the port as told by your health care provider. Ask your health care provider if you or a family member can get training for taking care of the port at home. A home health care nurse may also take care of the port. °· Make sure to remember what type of port you have. °Incision care °· Follow instructions from your health care provider about how to take care of your port insertion site. Make sure you: °? Wash your hands with soap and water before you change your bandage (dressing). If soap and water are not available, use hand sanitizer. °? Change your dressing as told by your health care provider. °? Leave stitches (sutures), skin glue, or adhesive strips in place. These skin closures may need to stay in place for 2 weeks or longer. If adhesive strip edges start to loosen and curl up, you may trim the loose edges. Do not remove adhesive strips completely unless your health care provider tells you to do that. °· Check your port insertion site every day for signs of infection. Check for: °? More redness, swelling, or pain. °? More fluid or blood. °? Warmth. °? Pus or a bad smell. °General  instructions °· Do not take baths, swim, or use a hot tub until your health care provider approves. °· Do not lift anything that is heavier than 10 lb (4.5 kg) for a week, or as told by your health care provider. °· Ask your health care provider when it is okay to: °? Return to work or school. °? Resume usual physical activities or sports. °· Do not drive for 24 hours if you were given a medicine to help you relax (sedative). °· Take over-the-counter and prescription medicines only as told by your health care provider. °· Wear a medical alert bracelet in case of an emergency. This will tell any health care providers that you have a port. °· Keep all follow-up visits as told by your health care provider. This is important. °Contact a health care provider if: °· You cannot   flush your port with saline as directed, or you cannot draw blood from the port. °· You have a fever or chills. °· You have more redness, swelling, or pain around your port insertion site. °· You have more fluid or blood coming from your port insertion site. °· Your port insertion site feels warm to the touch. °· You have pus or a bad smell coming from the port insertion site. °Get help right away if: °· You have chest pain or shortness of breath. °· You have bleeding from your port that you cannot control. °Summary °· Take care of the port as told by your health care provider. °· Change your dressing as told by your health care provider. °· Keep all follow-up visits as told by your health care provider. °This information is not intended to replace advice given to you by your health care provider. Make sure you discuss any questions you have with your health care provider. °Document Released: 08/10/2013 Document Revised: 09/10/2016 Document Reviewed: 09/10/2016 °Elsevier Interactive Patient Education © 2017 [employer] ° °

## 2017-02-09 NOTE — Interval H&P Note (Signed)
History and Physical Interval Note:  02/09/2017 9:45 AM  Janet Hanson  has presented today for surgery, with the diagnosis of colon cancer to liver cancer  The various methods of treatment have been discussed with the patient and family. After consideration of risks, benefits and other options for treatment, the patient has consented to  Procedure(s): INSERTION PORT-A-CATH (N/A) as a surgical intervention .  The patient's history has been reviewed, patient examined, no change in status, stable for surgery.  I have reviewed the patient's chart and labs.  Questions were answered to the patient's satisfaction.     Aviva Signs

## 2017-02-09 NOTE — Op Note (Signed)
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  Anes:  Mac  Indications:  Patient is a 71 year old white female with metastatic colon carcinoma to the liver who is about to undergo chemotherapy. The risks and benefits of the procedure including bleeding, infection, and pneumothorax were fully explained to the patient, who gave informed consent.  Procedure note:  The patient was placed in the Trendelenburg position after the right upper chest was prepped and draped using usual sterile technique with DuraPrep. Surgical site confirmation was performed. One percent Xylocaine was used for local anesthesia.  An incision was made below the right clavicle. Subcutaneous pocket was formed. The needles advanced into the right subclavian vein using the Seldinger technique without difficulty. A guidewire was then advanced into the right atrium under fluoroscopic guidance. An introducer and peel-away sheath were placed over the guidewire. The catheter was then inserted through the peel-away sheath and the peel-away sheath was removed. The catheter was then attached to the port and the port placed in subcutaneous pocket. Adequate positioning was confirmed by fluoroscopy. Good blood flow of blood was noted on aspiration of the port. The port was flushed with heparin flush. The subcutaneous layer was reapproximated using a 4-0 Vicryl interrupted suture. The skin was closed using a 4-0 Vicryl subcuticular suture. Dermabond was then applied.  All tape and needle counts were correct at the end of the procedure. Patient was transferred to PACU in stable condition. A chest x-ray will be performed at that time.  Complications:  None  EBL:  Minimal  Specimen:  None

## 2017-02-09 NOTE — Transfer of Care (Signed)
Immediate Anesthesia Transfer of Care Note  Patient: Janet Hanson  Procedure(s) Performed: Procedure(s): INSERTION PORT-A-CATH (N/A)  Patient Location: PACU  Anesthesia Type:MAC  Level of Consciousness: awake, oriented and patient cooperative  Airway & Oxygen Therapy: Patient Spontanous Breathing and Patient connected to nasal cannula oxygen  Post-op Assessment: Report given to RN and Post -op Vital signs reviewed and stable  Post vital signs: Reviewed and stable  Last Vitals:  Vitals:   02/09/17 0955 02/09/17 1000  BP:  (!) 145/91  Pulse:    Resp: (!) 24 (!) 21  Temp:      Last Pain:  Vitals:   02/09/17 0823  TempSrc: Oral  PainSc: 0-No pain         Complications: No apparent anesthesia complications

## 2017-02-10 ENCOUNTER — Encounter (HOSPITAL_COMMUNITY): Payer: Self-pay

## 2017-02-10 ENCOUNTER — Encounter: Payer: Self-pay | Admitting: *Deleted

## 2017-02-10 ENCOUNTER — Encounter (HOSPITAL_BASED_OUTPATIENT_CLINIC_OR_DEPARTMENT_OTHER): Payer: Medicare Other

## 2017-02-10 ENCOUNTER — Encounter (HOSPITAL_BASED_OUTPATIENT_CLINIC_OR_DEPARTMENT_OTHER): Payer: Medicare Other | Admitting: Oncology

## 2017-02-10 ENCOUNTER — Inpatient Hospital Stay (HOSPITAL_COMMUNITY): Payer: Medicare Other

## 2017-02-10 VITALS — BP 154/85 | HR 113 | Temp 98.6°F | Resp 20

## 2017-02-10 VITALS — BP 153/92 | HR 106 | Temp 98.0°F | Resp 18 | Wt 199.8 lb

## 2017-02-10 DIAGNOSIS — Z86711 Personal history of pulmonary embolism: Secondary | ICD-10-CM

## 2017-02-10 DIAGNOSIS — Z5111 Encounter for antineoplastic chemotherapy: Secondary | ICD-10-CM

## 2017-02-10 DIAGNOSIS — C182 Malignant neoplasm of ascending colon: Secondary | ICD-10-CM

## 2017-02-10 DIAGNOSIS — C189 Malignant neoplasm of colon, unspecified: Secondary | ICD-10-CM

## 2017-02-10 DIAGNOSIS — Z7901 Long term (current) use of anticoagulants: Secondary | ICD-10-CM | POA: Diagnosis not present

## 2017-02-10 DIAGNOSIS — C787 Secondary malignant neoplasm of liver and intrahepatic bile duct: Secondary | ICD-10-CM

## 2017-02-10 DIAGNOSIS — Z79899 Other long term (current) drug therapy: Secondary | ICD-10-CM | POA: Diagnosis not present

## 2017-02-10 DIAGNOSIS — D500 Iron deficiency anemia secondary to blood loss (chronic): Secondary | ICD-10-CM | POA: Diagnosis present

## 2017-02-10 DIAGNOSIS — Z87891 Personal history of nicotine dependence: Secondary | ICD-10-CM | POA: Diagnosis not present

## 2017-02-10 LAB — COMPREHENSIVE METABOLIC PANEL
ALT: 19 U/L (ref 14–54)
ANION GAP: 8 (ref 5–15)
AST: 26 U/L (ref 15–41)
Albumin: 3.7 g/dL (ref 3.5–5.0)
Alkaline Phosphatase: 59 U/L (ref 38–126)
BUN: 12 mg/dL (ref 6–20)
CHLORIDE: 97 mmol/L — AB (ref 101–111)
CO2: 29 mmol/L (ref 22–32)
Calcium: 8.7 mg/dL — ABNORMAL LOW (ref 8.9–10.3)
Creatinine, Ser: 0.59 mg/dL (ref 0.44–1.00)
GFR calc Af Amer: >60 mL/min (ref >60)
GFR calc non Af Amer: >60 mL/min (ref >60)
Glucose, Bld: 262 mg/dL — ABNORMAL HIGH (ref 65–99)
POTASSIUM: 3.8 mmol/L (ref 3.5–5.1)
SODIUM: 134 mmol/L — AB (ref 135–145)
Total Bilirubin: 0.5 mg/dL (ref 0.3–1.2)
Total Protein: 7.3 g/dL (ref 6.5–8.1)

## 2017-02-10 LAB — CBC WITH DIFFERENTIAL/PLATELET
Basophils Absolute: 0 10*3/uL (ref 0.0–0.1)
Basophils Relative: 0 %
EOS ABS: 0.1 10*3/uL (ref 0.0–0.7)
Eosinophils Relative: 1 %
HCT: 36.9 % (ref 36.0–46.0)
HEMOGLOBIN: 11.7 g/dL — AB (ref 12.0–15.0)
LYMPHS ABS: 2.2 10*3/uL (ref 0.7–4.0)
LYMPHS PCT: 21 %
MCH: 28.6 pg (ref 26.0–34.0)
MCHC: 31.7 g/dL (ref 30.0–36.0)
MCV: 90.2 fL (ref 78.0–100.0)
Monocytes Absolute: 0.7 10*3/uL (ref 0.1–1.0)
Monocytes Relative: 7 %
NEUTROS PCT: 71 %
Neutro Abs: 7.6 10*3/uL (ref 1.7–7.7)
Platelets: 300 10*3/uL (ref 150–400)
RBC: 4.09 MIL/uL (ref 3.87–5.11)
RDW: 14.8 % (ref 11.5–15.5)
WBC: 10.6 10*3/uL — AB (ref 4.0–10.5)

## 2017-02-10 LAB — IRON AND TIBC
Iron: 38 ug/dL (ref 28–170)
SATURATION RATIOS: 9 % — AB (ref 10.4–31.8)
TIBC: 440 ug/dL (ref 250–450)
UIBC: 402 ug/dL

## 2017-02-10 LAB — FERRITIN: FERRITIN: 26 ng/mL (ref 11–307)

## 2017-02-10 MED ORDER — OXALIPLATIN CHEMO INJECTION 100 MG/20ML
85.0000 mg/m2 | Freq: Once | INTRAVENOUS | Status: AC
  Administered 2017-02-10: 170 mg via INTRAVENOUS
  Filled 2017-02-10: qty 34

## 2017-02-10 MED ORDER — DEXTROSE 5 % IV SOLN
Freq: Once | INTRAVENOUS | Status: AC
  Administered 2017-02-10: 11:00:00 via INTRAVENOUS

## 2017-02-10 MED ORDER — PALONOSETRON HCL INJECTION 0.25 MG/5ML
0.2500 mg | Freq: Once | INTRAVENOUS | Status: AC
  Administered 2017-02-10: 0.25 mg via INTRAVENOUS
  Filled 2017-02-10: qty 5

## 2017-02-10 MED ORDER — SODIUM CHLORIDE 0.9 % IV SOLN
2400.0000 mg/m2 | INTRAVENOUS | Status: DC
  Administered 2017-02-10: 4750 mg via INTRAVENOUS
  Filled 2017-02-10: qty 95

## 2017-02-10 MED ORDER — DEXTROSE 5 % IV SOLN
404.0000 mg/m2 | Freq: Once | INTRAVENOUS | Status: AC
  Administered 2017-02-10: 800 mg via INTRAVENOUS
  Filled 2017-02-10: qty 35

## 2017-02-10 MED ORDER — DEXAMETHASONE SODIUM PHOSPHATE 10 MG/ML IJ SOLN
10.0000 mg | Freq: Once | INTRAMUSCULAR | Status: AC
  Administered 2017-02-10: 10 mg via INTRAVENOUS
  Filled 2017-02-10: qty 1

## 2017-02-10 MED ORDER — SODIUM CHLORIDE 0.9 % IV SOLN: 10.0000 mg | Freq: Once | INTRAVENOUS | Status: DC

## 2017-02-10 MED ORDER — FLUOROURACIL CHEMO INJECTION 2.5 GM/50ML
400.0000 mg/m2 | Freq: Once | INTRAVENOUS | Status: AC
  Administered 2017-02-10: 800 mg via INTRAVENOUS
  Filled 2017-02-10: qty 16

## 2017-02-10 NOTE — Progress Notes (Signed)
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 There is a small adjacent foci of gas and free fluid raising the possibility of micro perforation. There is marked dilatation of the cecum and small bowel with fluid and gas most compatible with proximal obstruction caused by the large ascending colonic mass. Additionally there is a 6 cm fatty mass involving the colon at the level the hepatic flexure, most suggestive of a lipoma. Aortic vascular calcifications. Sigmoid colonic diverticulosis without evidence for acute diverticulitis.No specific findings identified to suggest metastatic disease to the chest.There is a 3 mm nodule identified within the left upper lobe, nonspecific.Bilateral pleural effusions with overlying compressive type atelectasis and consolidation       06/26/2016 Tumor Marker    CEA 39.3 ng/ml      06/27/2016 Surgery    Partial colectomy with primary linear stapled anastomosis.  Large bulky mid-ascending colonic mass extending into the colonic mesentery and retroperitoneum with a layer of not visibly/palpably involved fat between the colon and underlying visualized and exposed duodenum and Right kidney      06/27/2016 Pathology Results    COLONIC ADENOCARCINOMA (8.5 CM), GRADE 2 THE TUMOR INVADES THROUGH THE MUSCULARIS PROPRIA INTO PERICOLONIC TISSUE  ALL MARGINS OF RESECTION ARE NEGATIVE FOR CARCINOMA FOURTEEN BENIGN LYMPH NODES (0/14) TUBULAR ADENOMA (X3) UNREMARKABLE APPENDIX      06/30/2016 Imaging    CT angio chest Positive examination for right upper  lobe and right lower lobe pulmonary emboli. No evidence of right heart strain. Again demonstrated are bilateral pleural effusions and basilar atelectasis, greater on the right.      07/30/2016 Tumor Marker      Ref. Range 07/30/2016 16:46  CEA Latest Ref Range: 0.0 - 4.7 ng/mL 4.0        07/30/2016 Miscellaneous    Medical Oncology consultation.  No adjuvant chemotherapy needed based upon risk factors and small benefit.  Informed decision made by patient to not pursue systemic chemotherapy in adjuvant setting.      11/05/2016 Procedure    Colonoscopy by Dr. Laural Golden.  soft anal tags found on digital rectal exam. - Patent end-to-side colo-colonic anastomosis, characterized by healthy appearing mucosa. - Three 5 to 7 mm polyps in the proximal transverse colon, removed with a cold snare. Resected and retrieved. - One 10 mm polyp in the proximal transverse colon, removed with a hot snare. Resected and retrieved. - One 20 mm polyp in the proximal sigmoid colon, removed with a hot snare. Resected and retrieved. Clip (MR conditional) was placed. - One 15 to 25 mm polyp at the recto-sigmoid colon, removed with a hot snare. Resected and retrieved. Clip (MR conditional) was placed. - Diverticulosis in the sigmoid colon and in the descending colon. - The distal rectum and anal verge are normal on retroflexion view.      11/07/2016 Pathology Results    1. Colon, polyp(s), transverse proximal - TUBULAR ADENOMA(S). - HIGH GRADE DYSPLASIA IS NOT IDENTIFIED. 2. Colon, polyp(s), proximal sigmoid - TUBULOVILLOUS ADENOMA WITH FOCAL HIGH GRADE DYSPLASIA (5%). 3. Rectosigmoid , polyp - TUBULOVILLOUS  ADENOMA WITH HIGH GRADE DYSPLASIA (15%).      12/26/2016 Imaging    CT abd/pelvis-1. Interval development of a 2.4 cm rim enhancing lesion in the lateral segment left liver, highly suspicious for metastatic disease. 2. New abnormal irregular soft tissue in the cul-de-sac, involving the anterior wall of  the rectum and posterior cervix. This is highly suspicious for metastatic involvement (drop lesion). 3. Interval Right hemicolectomy. 4.  Abdominal Aortic Atherosclerois (ICD10-170.0)      01/16/2017 Procedure    IR biopsy of hepatic lesion      01/19/2017 Pathology Results    Liver, needle/core biopsy, Left Lobe - ADENOCARCINOMA Microscopic Comment The morphology is consistent with metastatic colorectal adenocarcinoma. There is sufficient tissue for additional studies. Called to Jenita Seashore on 01/19/17      HISTORY OF PRESENTING ILLNESS:  Janet Hanson 71 y.o. female is here for follow up of stage IV, T3 N0 M1 ascending colonic adenocarcinoma with liver metastasis.   She is doing well today. She is starting FOLFOX today. She had her port placed yesterday. It is sore, but otherwise fine. Pt is very anxious about treatment today. She didn't take her Coumadin today, but will take it tomorrow. Denies abdominal pain, or any other concerns.   MEDICAL HISTORY:  Past Medical History:  Diagnosis Date  . Cancer of right colon (HCC) 07/30/2016  . Colon cancer (HCC)   . Depression   . Hypercholesterolemia   . Hypertension   . Iron deficiency anemia due to chronic blood loss 07/30/2016  . Vitamin B12 deficiency 08/13/2016    SURGICAL HISTORY: Past Surgical History:  Procedure Laterality Date  . COLONOSCOPY N/A 11/05/2016   Procedure: COLONOSCOPY;  Surgeon: Malissa Hippo, MD;  Location: [facility] SUITE;  Service: Endoscopy;  Laterality: N/A;  1:20  . PARTIAL COLECTOMY N/A 06/27/2016   Procedure: PARTIAL COLECTOMY;  Surgeon: Ancil Linsey, MD;  Location: [facility] ORS;  Service: General;  Laterality: N/A;    SOCIAL HISTORY: Social History   Social History  . Marital status: Widowed    Spouse name: N/A  . Number of children: N/A  . Years of education: N/A   Occupational History  . Not on file.   Social History Main Topics  . Smoking status: Former Smoker    Packs/day: 0.50     Years: 48.00    Types: Cigarettes    Quit date: 06/24/2016  . Smokeless tobacco: Never Used     Comment: quit smoking Sept 2017  . Alcohol use No     Comment: glass wine  twice per year  . Drug use: No  . Sexual activity: No     Comment: widowed- 1 daughter   Other Topics Concern  . Not on file   Social History Narrative  . No narrative on file   Widowed for 6 years 1 child 1 grandchild Ex smoker, quit June 24, 2016. Smoked since 71 years old so about 40 years total She is retired. Worked in [place] and [place]. 0 pets  FAMILY HISTORY: Family History  Problem Relation Age of Onset  . Hypertension Mother   . Other Father     some type of "stomach ailment"  . Suicidality Brother   . Stomach cancer Maternal Aunt   . Brain cancer Brother    Mother deceased at 46 yo. 2 years ago. She had high blood pressure. She fell and broke her hip. Didn't have any disease "as far as I know" Father  deceased in his 29s. She is not sure what of. He ate Ex Lax all the time for constipation. 2 brothers. The patient is the middle child. Older brother was 81 years-old and committed suicide Youngest brother had brain cancer and died last year  ALLERGIES:  has No Known Allergies.  MEDICATIONS:  Current Outpatient Prescriptions  Medication Sig Dispense Refill  . acetaminophen (TYLENOL) 325 MG tablet Take 325 mg by mouth every 6 (six) hours as needed for mild pain or moderate pain.    Marland Kitchen amLODipine (NORVASC) 5 MG tablet Take 5 mg by mouth daily.    . Ascorbic Acid (VITAMIN C) 1000 MG tablet Take 1,000 mg by mouth daily.    . Cyanocobalamin (B-12) 5000 MCG SUBL Place 1 tablet under the tongue daily. 30 tablet 11  . dexamethasone (DECADRON) 4 MG tablet Take 2 tablets (8 mg total) by mouth daily. Start the day after chemotherapy for 2 days. Take with food. 30 tablet 1  . escitalopram (LEXAPRO) 10 MG tablet Take 10 mg by mouth daily.    . fluorouracil CALGB 28413 in sodium chloride 0.9 %  150 mL Inject into the vein over 48 hr.    . HYDROcodone-acetaminophen (NORCO) 5-325 MG tablet Take 1 tablet by mouth every 6 (six) hours as needed for moderate pain. 30 tablet 0  . iron polysaccharides (NIFEREX) 150 MG capsule Take 1 capsule (150 mg total) by mouth daily. 30 capsule 6  . LEUCOVORIN CALCIUM IV Inject into the vein. Every 2 weeks    . lidocaine-prilocaine (EMLA) cream Apply to affected area once 30 g 3  . Omega-3 Fatty Acids (FISH OIL) 1200 MG CAPS Take 1,200 capsules by mouth daily.     . ondansetron (ZOFRAN) 8 MG tablet Take 1 tablet (8 mg total) by mouth 2 (two) times daily as needed for refractory nausea / vomiting. Start on day 3 after chemotherapy. 30 tablet 1  . OXALIPLATIN IV Inject into the vein. Every 2 weeks    . prochlorperazine (COMPAZINE) 10 MG tablet Take 1 tablet (10 mg total) by mouth every 6 (six) hours as needed (Nausea or vomiting). 30 tablet 1  . simvastatin (ZOCOR) 40 MG tablet Take 40 mg by mouth daily.    Marland Kitchen warfarin (COUMADIN) 5 MG tablet Take 2.5-5 mg by mouth daily. Takes '5mg'$  daily except 2.'5mg'$  on Mondays and Thursdays.     No current facility-administered medications for this visit.    Facility-Administered Medications Ordered in Other Visits  Medication Dose Route Frequency Provider Last Rate Last Dose  . dexamethasone (DECADRON) 10 mg in sodium chloride 0.9 % 50 mL IVPB  10 mg Intravenous Once Twana First, MD      . dextrose 5 % solution   Intravenous Once Twana First, MD      . palonosetron (ALOXI) injection 0.25 mg  0.25 mg Intravenous Once Twana First, MD       Review of Systems  Constitutional: Negative.   HENT: Negative.   Eyes: Negative.   Respiratory: Negative.   Cardiovascular: Negative.   Gastrointestinal: Negative.  Negative for abdominal pain.  Genitourinary: Negative.   Musculoskeletal:       Sore at port site  Skin: Negative.   Neurological: Negative.   Endo/Heme/Allergies: Negative.   Psychiatric/Behavioral: The patient is  nervous/anxious.   All other systems reviewed and are negative. 14 point ROS was done and is otherwise as detailed above or in HPI  PHYSICAL EXAMINATION: ECOG PERFORMANCE STATUS: 0 - Asymptomatic  Vitals:  02/10/17 0945  BP: (!) 153/92  Pulse: (!) 106  Resp: 18  Temp: 98 F (36.7 C)   Filed Weights   02/10/17 0945  Weight: 199 lb 12.8 oz (90.6 kg)   Physical Exam  Constitutional: She is oriented to person, place, and time and well-developed, well-nourished, and in no distress.  HENT:  Head: Normocephalic and atraumatic.  Nose: Nose normal.  Mouth/Throat: Oropharynx is clear and moist. No oropharyngeal exudate.  Eyes: Conjunctivae and EOM are normal. Pupils are equal, round, and reactive to light. Right eye exhibits no discharge. Left eye exhibits no discharge. No scleral icterus.  Neck: Normal range of motion. Neck supple. No tracheal deviation present. No thyromegaly present.  Cardiovascular: Normal rate, regular rhythm and normal heart sounds.  Exam reveals no gallop and no friction rub.   No murmur heard. Pulmonary/Chest: Effort normal and breath sounds normal. She has no wheezes. She has no rales.  Right chest wall port C/D/I mild erythema  Abdominal: Soft. Bowel sounds are normal. She exhibits no distension and no mass. There is no tenderness. There is no rebound and no guarding.  Musculoskeletal: Normal range of motion. She exhibits no edema.  Lymphadenopathy:    She has no cervical adenopathy.  Neurological: She is alert and oriented to person, place, and time. She has normal reflexes. No cranial nerve deficit. Gait normal. Coordination normal.  Skin: Skin is warm and dry. No rash noted.  Psychiatric: Mood, memory, affect and judgment normal.  Nursing note and vitals reviewed.  LABORATORY DATA:  I have reviewed the data as listed Lab Results  Component Value Date   WBC 9.3 02/05/2017   HGB 12.5 02/05/2017   HCT 39.5 02/05/2017   MCV 89.4 02/05/2017   PLT 286  02/05/2017   CMP     Component Value Date/Time   NA 136 02/09/2017 0818   K 4.2 02/09/2017 0818   CL 98 (L) 02/09/2017 0818   CO2 26 02/09/2017 0818   GLUCOSE 215 (H) 02/09/2017 0818   BUN 13 02/09/2017 0818   CREATININE 0.71 02/09/2017 0818   CALCIUM 9.0 02/09/2017 0818   PROT 8.0 12/10/2016 1451   ALBUMIN 4.1 12/10/2016 1451   AST 30 12/10/2016 1451   ALT 29 12/10/2016 1451   ALKPHOS 53 12/10/2016 1451   BILITOT 0.6 12/10/2016 1451   GFRNONAA >60 02/09/2017 0818   GFRAA >60 02/09/2017 0818   RADIOGRAPHIC STUDIES: I have personally reviewed the radiological images as listed and agreed with the findings in the report.  CT Chest w Contrast 02/06/2017 IMPRESSION: 1. Stable CT chest. No substantial change in the 7 mm short axis left paraesophageal lymph node. 2. Rim enhancing lesion lateral segment left liver has progressed in the interval  CT Abdomen Pelvis w Contrast 12/26/2016 IMPRESSION: 1. Interval development of a 2.4 cm rim enhancing lesion in the lateral segment left liver, highly suspicious for metastatic disease. 2. New abnormal irregular soft tissue in the cul-de-sac, involving the anterior wall of the rectum and posterior cervix. This is highly suspicious for metastatic involvement (drop lesion). 3. Interval Right hemicolectomy. 4.  Abdominal Aortic Atherosclerois (ICD10-170.0)  PATHOLOGY   ASSESSMENT & PLAN:  Initial 06/2016 with Stage IIA (T3N0M0) adenocarcinoma of colon 06/2016 but now Stage IV, T3 N0 M1 ascending colonic adenocarcinoma with liver met and drop lesion to cul de sac Bilateral pulmonary emboli Chronic anticoagulation Iron deficiency anemia secondary to chronic GI related blood loss Lack of routine screening   Start palliative FOLFOX today. We  discussed possible side effects again. Assured patient that we will be able to better assess what side effects she will have once she completes her first cycle.   After 6 cycles of FOLFOX, assess for  resectabilty of oligometastatic disease with restaging PET-CT.  I reviewed the CT Chest with the patient in detail again.   She will return for follow up in 2 weeks.   ORDERS PLACED FOR THIS ENCOUNTER: No orders of the defined types were placed in this encounter.   All questions were answered. The patient knows to call the clinic with any problems, questions or concerns.  This document serves as a record of services personally performed by Twana First, MD. It was created on her behalf by Martinique Casey, a trained medical scribe. The creation of this record is based on the scribe's personal observations and the provider's statements to them. This document has been checked and approved by the attending provider.  I have reviewed the above documentation for accuracy and completeness and I agree with the above.  This note was electronically signed.    Martinique M Casey  02/10/2017 9:46 AM

## 2017-02-10 NOTE — Patient Instructions (Signed)
[facility] at [facility] Discharge Instructions  RECOMMENDATIONS MADE BY THE CONSULTANT AND ANY TEST RESULTS WILL BE SENT TO YOUR REFERRING PHYSICIAN.  You were seen today by Dr. Louise Zhou Follow up in 2 weeks See Amy up front for appointments   Thank you for choosing [facility] at [facility] to provide your oncology and hematology care.  To afford each patient quality time with our provider, please arrive at least 15 minutes before your scheduled appointment time.    If you have a lab appointment with the Cancer Center please come in thru the  Main Entrance and check in at the main information desk  You need to re-schedule your appointment should you arrive 10 or more minutes late.  We strive to give you quality time with our providers, and arriving late affects you and other patients whose appointments are after yours.  Also, if you no show three or more times for appointments you may be dismissed from the clinic at the providers discretion.     Again, thank you for choosing [facility].  Our hope is that these requests will decrease the amount of time that you wait before being seen by our physicians.       _____________________________________________________________  Should you have questions after your visit to  Cancer Center, please contact our office at [phone] between the hours of 8:30 a.m. and 4:30 p.m.  Voicemails left after 4:30 p.m. will not be returned until the following business day.  For prescription refill requests, have your pharmacy contact our office.       Resources For Cancer Patients and their Caregivers ? American Cancer Society: Can assist with transportation, wigs, general needs, runs Look Good Feel Better.        1-888-227-6333 ? Cancer Care: Provides financial assistance, online support groups, medication/co-pay assistance.  1-800-813-HOPE (4673) ? [facility] Assists [place] cancer patients and their families through emotional , educational and financial support.  336-427-4357 ? [place] DSS Where to apply for food stamps, Medicaid and utility assistance. 336-342-1394 ? RCATS: Transportation to medical appointments. 336-347-2287 ? Social Security Administration: May apply for disability if have a Stage IV cancer. 336-342-7796 1-800-772-1213 ? [place] Aging, Disability and Transit Services: Assists with nutrition, care and transit needs. 336-349-2343  Cancer Center Support Programs: @10RELATIVEDAYS@ > Cancer Support Group  2nd Tuesday of the month 1pm-2pm, Journey Room  > Creative Journey  3rd Tuesday of the month 1130am-1pm, Journey Room  > Look Good Feel Better  1st Wednesday of the month 10am-12 noon, Journey Room (Call American Cancer Society to register 1-800-395-5775)    

## 2017-02-10 NOTE — Progress Notes (Signed)
Went to visit pt during first chemo treatment.  Pt doing well.  Pt will call with any problems.

## 2017-02-10 NOTE — Patient Instructions (Signed)
[facility] Discharge Instructions for Patients Receiving Chemotherapy   Beginning January 23rd 2017 lab work for the [facility] will be done in the  Main lab at [facility] on 1st floor. If you have a lab appointment with the [facility] please come in thru the  Main Entrance and check in at the main information desk   Today you received the following chemotherapy agents oxaliplatin, leucovorin and 71fu.  To help prevent nausea and vomiting after your treatment, we encourage you to take your nausea medication as instructed.   If you develop nausea and vomiting, or diarrhea that is not controlled by your medication, call the clinic.  The clinic phone number is [phone]. Office hours are Monday-Friday 8:30am-5:00pm.  BELOW ARE SYMPTOMS THAT SHOULD BE REPORTED IMMEDIATELY:  *FEVER GREATER THAN 101.0 F  *CHILLS WITH OR WITHOUT FEVER  NAUSEA AND VOMITING THAT IS NOT CONTROLLED WITH YOUR NAUSEA MEDICATION  *UNUSUAL SHORTNESS OF BREATH  *UNUSUAL BRUISING OR BLEEDING  TENDERNESS IN MOUTH AND THROAT WITH OR WITHOUT PRESENCE OF ULCERS  *URINARY PROBLEMS  *BOWEL PROBLEMS  UNUSUAL RASH Items with * indicate a potential emergency and should be followed up as soon as possible. If you have an emergency after office hours please contact your primary care physician or go to the nearest emergency department.  Please call the clinic during office hours if you have any questions or concerns.   You may also contact the Patient Navigator at [phone] should you have any questions or need assistance in obtaining follow up care.      Resources For Cancer Patients and their Caregivers ? American Cancer Society: Can assist with transportation, wigs, general needs, runs Look Good Feel Better.        (367)396-7457 ? Cancer Care: Provides financial assistance, online support groups, medication/co-pay assistance.  1-800-813-HOPE 8042076685) ? [facility] Assists [place] cancer patients and their families through emotional , educational and financial support.  (330)327-8308 ? [place] DSS Where to apply for food stamps, Medicaid and utility assistance. 424-676-9039 ? RCATS: Transportation to medical appointments. 901-702-9981 ? Social Security Administration: May apply for disability if have a Stage IV cancer. (226) 490-2063 8387115981 ? [employer], Disability and Transit Services: Assists with nutrition, care and transit needs. 670-108-2970

## 2017-02-10 NOTE — Progress Notes (Signed)
[facility] Clinical Social Work  Clinical Social Work was referred by patient navigator for assessment of psychosocial needs due to new cancer diagnosis and starting treatment today. Clinical Social Worker met with patient and her daughter at [facility] to offer support and assess for needs. CSW introduced self, explained role of CSW/Pt and Family Support Team, support groups and other resources to assist. CSW briefly discussed common emotions experience when starting treatment and coping techniques. CSW provided handouts and contact information as well. Pt and daughter denied current needs and agree to reach out as needed.      Clinical Social Work interventions: Resource education and referral   Loren Racer, LCSW, OSW-C [place] Tuesdays   [phone]

## 2017-02-10 NOTE — Progress Notes (Signed)
Tolerated chemo well. Continuous infusion pump intact. Stable and ambulatory on discharge home with daughter.

## 2017-02-11 ENCOUNTER — Telehealth (HOSPITAL_COMMUNITY): Payer: Self-pay

## 2017-02-11 ENCOUNTER — Other Ambulatory Visit (HOSPITAL_COMMUNITY): Payer: Self-pay | Admitting: Oncology

## 2017-02-11 LAB — CEA: CEA: 35.2 ng/mL — ABNORMAL HIGH (ref 0.0–4.7)

## 2017-02-11 NOTE — Telephone Encounter (Signed)
24 hour call back- patient stated that she is doing good.

## 2017-02-12 ENCOUNTER — Encounter (HOSPITAL_BASED_OUTPATIENT_CLINIC_OR_DEPARTMENT_OTHER): Payer: Medicare Other

## 2017-02-12 VITALS — BP 149/87 | HR 104 | Temp 98.9°F | Resp 22

## 2017-02-12 DIAGNOSIS — Z452 Encounter for adjustment and management of vascular access device: Secondary | ICD-10-CM | POA: Diagnosis present

## 2017-02-12 DIAGNOSIS — C189 Malignant neoplasm of colon, unspecified: Secondary | ICD-10-CM

## 2017-02-12 DIAGNOSIS — C182 Malignant neoplasm of ascending colon: Secondary | ICD-10-CM | POA: Diagnosis not present

## 2017-02-12 DIAGNOSIS — C787 Secondary malignant neoplasm of liver and intrahepatic bile duct: Principal | ICD-10-CM

## 2017-02-12 MED ORDER — HEPARIN SOD (PORK) LOCK FLUSH 100 UNIT/ML IV SOLN
INTRAVENOUS | Status: AC
  Filled 2017-02-12: qty 5

## 2017-02-12 MED ORDER — HEPARIN SOD (PORK) LOCK FLUSH 100 UNIT/ML IV SOLN
500.0000 [IU] | Freq: Once | INTRAVENOUS | Status: AC | PRN
  Administered 2017-02-12: 500 [IU]

## 2017-02-12 MED ORDER — SODIUM CHLORIDE 0.9% FLUSH
10.0000 mL | INTRAVENOUS | Status: DC | PRN
  Administered 2017-02-12: 10 mL
  Filled 2017-02-12: qty 10

## 2017-02-12 NOTE — Progress Notes (Signed)
Continuous infusion pump stopped/completed on patients arrival. Disconnected pump from patient. Flushed port per protocol and removed needle. Patient denies any complaints from chemo infusion, states she drove herself here today from [place]. No other complaints voiced. Stable and ambulatory on discharge home to self.

## 2017-02-12 NOTE — Patient Instructions (Signed)
[place] at [facility] Discharge Instructions  RECOMMENDATIONS MADE BY THE CONSULTANT AND ANY TEST RESULTS WILL BE SENT TO YOUR REFERRING PHYSICIAN.  Continuous infusion pump disconnected and port flush done. Return as scheduled.  Thank you for choosing [place] at [facility] to provide your oncology and hematology care.  To afford each patient quality time with our provider, please arrive at least 15 minutes before your scheduled appointment time.    If you have a lab appointment with the [facility] please come in thru the  Main Entrance and check in at the main information desk  You need to re-schedule your appointment should you arrive 10 or more minutes late.  We strive to give you quality time with our providers, and arriving late affects you and other patients whose appointments are after yours.  Also, if you no show three or more times for appointments you may be dismissed from the clinic at the providers discretion.     Again, thank you for choosing [facility].  Our hope is that these requests will decrease the amount of time that you wait before being seen by our physicians.       _____________________________________________________________  Should you have questions after your visit to [facility], please contact our office at [phone] between the hours of 8:30 a.m. and 4:30 p.m.  Voicemails left after 4:30 p.m. will not be returned until the following business day.  For prescription refill requests, have your pharmacy contact our office.       Resources For Cancer Patients and their Caregivers ? American Cancer Society: Can assist with transportation, wigs, general needs, runs Look Good Feel Better.        513-114-3207 ? Cancer Care: Provides financial assistance, online support groups, medication/co-pay assistance.  1-800-813-HOPE 817-072-1488) ? [facility] Assists  [place] cancer patients and their families through emotional , educational and financial support.  (614) 546-8036 ? [place] DSS Where to apply for food stamps, Medicaid and utility assistance. 760-628-6225 ? RCATS: Transportation to medical appointments. 337-039-5559 ? Social Security Administration: May apply for disability if have a Stage IV cancer. 9417319894 304-233-9183 ? [employer], Disability and Transit Services: Assists with nutrition, care and transit needs. [place] Support Programs: @10RELATIVEDAYS @ > Cancer Support Group  2nd Tuesday of the month 1pm-2pm, Journey Room  > Creative Journey  3rd Tuesday of the month 1130am-1pm, Journey Room  > Look Good Feel Better  1st Wednesday of the month 10am-12 noon, Journey Room (Call [place] to register [phone])

## 2017-02-13 ENCOUNTER — Telehealth (HOSPITAL_COMMUNITY): Payer: Self-pay | Admitting: *Deleted

## 2017-02-13 NOTE — Telephone Encounter (Signed)
Spoke with patient. Reports mild nausea last pm, took one nausea pill with relief. No other complaints voiced.

## 2017-02-16 ENCOUNTER — Ambulatory Visit (HOSPITAL_COMMUNITY): Payer: Medicare Other

## 2017-02-17 ENCOUNTER — Encounter (HOSPITAL_COMMUNITY): Payer: Self-pay

## 2017-02-17 ENCOUNTER — Encounter (HOSPITAL_BASED_OUTPATIENT_CLINIC_OR_DEPARTMENT_OTHER): Payer: Medicare Other

## 2017-02-17 VITALS — BP 117/77 | HR 99 | Temp 98.1°F | Resp 18

## 2017-02-17 DIAGNOSIS — D500 Iron deficiency anemia secondary to blood loss (chronic): Secondary | ICD-10-CM

## 2017-02-17 MED ORDER — SODIUM CHLORIDE 0.9 % IV SOLN
Freq: Once | INTRAVENOUS | Status: AC
  Administered 2017-02-17: 15:00:00 via INTRAVENOUS

## 2017-02-17 MED ORDER — FERUMOXYTOL INJECTION 510 MG/17 ML
510.0000 mg | Freq: Once | INTRAVENOUS | Status: AC
  Administered 2017-02-17: 510 mg via INTRAVENOUS
  Filled 2017-02-17: qty 17

## 2017-02-17 MED ORDER — HEPARIN SOD (PORK) LOCK FLUSH 100 UNIT/ML IV SOLN
INTRAVENOUS | Status: AC
  Filled 2017-02-17: qty 5

## 2017-02-17 MED ORDER — HEPARIN SOD (PORK) LOCK FLUSH 100 UNIT/ML IV SOLN
500.0000 [IU] | Freq: Once | INTRAVENOUS | Status: AC
  Administered 2017-02-17: 500 [IU] via INTRAVENOUS

## 2017-02-17 NOTE — Patient Instructions (Signed)
[facility] at [facility] Discharge Instructions  RECOMMENDATIONS MADE BY THE CONSULTANT AND ANY TEST RESULTS WILL BE SENT TO YOUR REFERRING PHYSICIAN.  Feraheme given today Follow up as scheduled.  Thank you for choosing [facility] to provide your oncology and hematology care.  To afford each patient quality time with our provider, please arrive at least 15 minutes before your scheduled appointment time.    If you have a lab appointment with the Cancer Center please come in thru the  Main Entrance and check in at the main information desk  You need to re-schedule your appointment should you arrive 10 or more minutes late.  We strive to give you quality time with our providers, and arriving late affects you and other patients whose appointments are after yours.  Also, if you no show three or more times for appointments you may be dismissed from the clinic at the providers discretion.     Again, thank you for choosing [facility].  Our hope is that these requests will decrease the amount of time that you wait before being seen by our physicians.       _____________________________________________________________  Should you have questions after your visit to  Cancer Center, please contact our office at [phone] between the hours of 8:30 a.m. and 4:30 p.m.  Voicemails left after 4:30 p.m. will not be returned until the following business day.  For prescription refill requests, have your pharmacy contact our office.       Resources For Cancer Patients and their Caregivers ? American Cancer Society: Can assist with transportation, wigs, general needs, runs Look Good Feel Better.        1-888-227-6333 ? Cancer Care: Provides financial assistance, online support groups, medication/co-pay assistance.  1-800-813-HOPE (4673) ? [facility] Assists [place] cancer patients and  their families through emotional , educational and financial support.  336-427-4357 ? [place] DSS Where to apply for food stamps, Medicaid and utility assistance. 336-342-1394 ? RCATS: Transportation to medical appointments. 336-347-2287 ? Social Security Administration: May apply for disability if have a Stage IV cancer. 336-342-7796 1-800-772-1213 ? [place] Aging, Disability and Transit Services: Assists with nutrition, care and transit needs. 336-349-2343  Cancer Center Support Programs: @10RELATIVEDAYS@ > Cancer Support Group  2nd Tuesday of the month 1pm-2pm, Journey Room  > Creative Journey  3rd Tuesday of the month 1130am-1pm, Journey Room  > Look Good Feel Better  1st Wednesday of the month 10am-12 noon, Journey Room (Call American Cancer [employer] to register 1-800-395-5775)   

## 2017-02-17 NOTE — Progress Notes (Signed)
feraheme given today per orders. Patient tolerated it well. Vitals stable and discharged home from clinic ambulatory. Follow up as scheduled. 

## 2017-02-18 ENCOUNTER — Ambulatory Visit (INDEPENDENT_AMBULATORY_CARE_PROVIDER_SITE_OTHER): Payer: Medicare Other | Admitting: Internal Medicine

## 2017-02-18 ENCOUNTER — Encounter (INDEPENDENT_AMBULATORY_CARE_PROVIDER_SITE_OTHER): Payer: Self-pay | Admitting: Internal Medicine

## 2017-02-18 VITALS — BP 120/72 | HR 80 | Temp 98.2°F | Ht 62.0 in | Wt 196.0 lb

## 2017-02-18 DIAGNOSIS — C787 Secondary malignant neoplasm of liver and intrahepatic bile duct: Secondary | ICD-10-CM | POA: Diagnosis not present

## 2017-02-18 DIAGNOSIS — C189 Malignant neoplasm of colon, unspecified: Secondary | ICD-10-CM

## 2017-02-18 NOTE — Progress Notes (Signed)
Subjective:    Patient ID: Janet Hanson, female    DOB: Jul 05, 1946, 71 y.o.   MRN: [id number]  HPI Here today for f/u.  She was last seen in October of 2017.  She was sent by Dr. Whitney Muse. Patient had never undergone a colonoscopy in the past. She underwent a partial colectomy with primary linear anastomosis 06/27/2016 for an obstructing neoplasm at the rt ascending colon by Dr. Rosana Hoes. Biopsy: adenocarcinoma.  Followed at the [facility] [place].  Here today for f/u. She tells me she is okay. She says she is doing as well as expected.  Her appetite is okay. Has a BM every day. No melena or BRRB.  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  She states she has started chemo. 1st round was last week. She will have her second round next week.  Per records she was start on palliative Folfox.  She will have 6 cycles, she will be assessed for resectability of oligometastasic disease with restaging PET_CT.       01/19/2017 Pathology Results    Liver, needle/core biopsy, Left Lobe - ADENOCARCINOMA Microscopic Comment The morphology is consistent with metastatic colorectal adenocarcinoma. There is sufficient tissue for additional studies. Called to Kirby Crigler on 01/19/17       11/05/2016 Colonoscopy: Hx of colon cancer:  Impression:               -  soft anal tags found on digital rectal exam.                           - Patent end-to-side colo-colonic anastomosis,                            characterized by healthy appearing mucosa.                           - Three 5 to 7 mm polyps in the proximal transverse                            colon, removed with a cold snare. Resected and                            retrieved.                           - One 10 mm polyp in the proximal transverse colon,                            removed with a hot snare. Resected and retrieved.                           - One 20 mm polyp in the proximal sigmoid  colon,                            removed with a hot snare. Resected and retrieved.                            Clip (MR conditional) was placed.                           -  One 15 to 25 mm polyp at the recto-sigmoid colon,                            removed with a hot snare. Resected and retrieved.                            Clip (MR conditional) was placed.                           - Diverticulosis in the sigmoid colon and in the                            descending colon.                           - The distal rectum and anal verge are normal on                            retroflexion view. Biopsy: Tubular adenoma: Colon. 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  Review of Systems Past Medical History:  Diagnosis Date  . Cancer of right colon ([place]) 07/30/2016  . Colon cancer ([place])   . Depression   . Hypercholesterolemia   . Hypertension   . Iron deficiency anemia due to chronic blood loss 07/30/2016  . Vitamin B12 deficiency 08/13/2016    Past Surgical History:  Procedure Laterality Date  . COLONOSCOPY N/A 11/05/2016   Procedure: COLONOSCOPY;  Surgeon: Rogene Houston, MD;  Location: [facility] END[facility];  Service: Endoscopy;  Laterality: N/A;  1:20  . PARTIAL COLECTOMY N/A 06/27/2016   Procedure: PARTIAL COLECTOMY;  Surgeon: Vickie Epley, MD;  Location: [facility] ORS;  Service: General;  Laterality: N/A;  . PORTACATH PLACEMENT Right 02/09/2017   Procedure: INSERTION PORT-A-CATH;  Surgeon: Aviva Signs, MD;  Location: [facility] ORS;  Service: General;  Laterality: Right;  right subclavian    No Known Allergies  Current Outpatient Prescriptions on File Prior to Visit  Medication Sig Dispense Refill  . acetaminophen (TYLENOL) 325 MG tablet Take 325 mg by mouth every 6 (six) hours as needed for mild pain or moderate pain.    Marland Kitchen amLODipine (NORVASC) 5 MG tablet Take 5 mg by mouth daily.    . Ascorbic Acid (VITAMIN C) 1000 MG tablet Take 1,000 mg by mouth daily.    .  Cyanocobalamin (B-12) 5000 MCG SUBL Place 1 tablet under the tongue daily. 30 tablet 11  . fluorouracil CALGB 44818 in sodium chloride 0.9 % 150 mL Inject into the vein over 48 hr.    . HYDROcodone-acetaminophen (NORCO) 5-325 MG tablet Take 1 tablet by mouth every 6 (six) hours as needed for moderate pain. 30 tablet 0  . iron polysaccharides (NIFEREX) 150 MG capsule Take 1 capsule (150 mg total) by mouth daily. 30 capsule 6  . Omega-3 Fatty Acids (FISH OIL) 1200 MG CAPS Take 1,200 capsules by mouth daily.     . ondansetron (ZOFRAN) 8 MG tablet Take 1 tablet (8 mg total) by mouth 2 (two) times daily as needed for refractory nausea / vomiting. Start on day 3 after chemotherapy. 30 tablet 1  . simvastatin (ZOCOR) 40 MG tablet Take 40 mg by mouth daily.    Marland Kitchen warfarin (COUMADIN)  5 MG tablet Take 2.5-5 mg by mouth daily. Takes 5mg  daily except 2.5mg  on Mondays and Thursdays.    Marland Kitchen dexamethasone (DECADRON) 4 MG tablet Take 2 tablets (8 mg total) by mouth daily. Start the day after chemotherapy for 2 days. Take with food. (Patient not taking: Reported on 02/10/2017) 30 tablet 1  . escitalopram (LEXAPRO) 10 MG tablet Take 10 mg by mouth daily.    Marland Kitchen LEUCOVORIN CALCIUM IV Inject into the vein. Every 2 weeks    . lidocaine-prilocaine (EMLA) cream Apply to affected area once (Patient not taking: Reported on 02/10/2017) 30 g 3  . OXALIPLATIN IV Inject into the vein. Every 2 weeks    . prochlorperazine (COMPAZINE) 10 MG tablet Take 1 tablet (10 mg total) by mouth every 6 (six) hours as needed (Nausea or vomiting). (Patient not taking: Reported on 02/10/2017) 30 tablet 1   No current facility-administered medications on file prior to visit.        Objective:   Physical Exam Blood pressure 120/72, pulse 80, temperature 98.2 F (36.8 C), height 5\' 2"  (1.575 m), weight 196 lb (88.9 kg).Alert and oriented. Skin warm and dry. Oral mucosa is moist.   . Sclera anicteric, conjunctivae is pink. Thyroid not enlarged. No  cervical lymphadenopathy. Lungs clear. Heart regular rate and rhythm.  Abdomen is soft. Bowel sounds are positive. No hepatomegaly. No abdominal masses felt. No tenderness.  No edema to lower extremities. Patient is alert and oriented.         Assessment & Plan:  Colon cancer with mets to the liver. She seems to be doing well. Has 5 more tx of chemo and then will be reasssed. She will f/u with the Cancer Cancer. OV one year.

## 2017-02-18 NOTE — Patient Instructions (Signed)
OV one year.

## 2017-02-24 ENCOUNTER — Other Ambulatory Visit (HOSPITAL_COMMUNITY)
Admission: RE | Admit: 2017-02-24 | Discharge: 2017-02-24 | Disposition: A | Discharge status: Home / Self Care | Payer: Medicare Other | Source: Ambulatory Visit | Attending: Oncology | Admitting: Oncology

## 2017-02-24 ENCOUNTER — Encounter (HOSPITAL_BASED_OUTPATIENT_CLINIC_OR_DEPARTMENT_OTHER): Payer: Medicare Other | Admitting: Oncology

## 2017-02-24 ENCOUNTER — Encounter (HOSPITAL_COMMUNITY): Payer: Self-pay | Admitting: Oncology

## 2017-02-24 ENCOUNTER — Encounter (HOSPITAL_BASED_OUTPATIENT_CLINIC_OR_DEPARTMENT_OTHER): Payer: Medicare Other

## 2017-02-24 VITALS — BP 146/73 | HR 109 | Temp 99.4°F | Resp 20 | Wt 198.0 lb

## 2017-02-24 DIAGNOSIS — C787 Secondary malignant neoplasm of liver and intrahepatic bile duct: Secondary | ICD-10-CM | POA: Diagnosis not present

## 2017-02-24 DIAGNOSIS — Z5111 Encounter for antineoplastic chemotherapy: Secondary | ICD-10-CM

## 2017-02-24 DIAGNOSIS — R739 Hyperglycemia, unspecified: Secondary | ICD-10-CM

## 2017-02-24 DIAGNOSIS — C182 Malignant neoplasm of ascending colon: Secondary | ICD-10-CM

## 2017-02-24 DIAGNOSIS — C189 Malignant neoplasm of colon, unspecified: Secondary | ICD-10-CM

## 2017-02-24 DIAGNOSIS — E538 Deficiency of other specified B group vitamins: Secondary | ICD-10-CM

## 2017-02-24 DIAGNOSIS — D500 Iron deficiency anemia secondary to blood loss (chronic): Secondary | ICD-10-CM | POA: Diagnosis not present

## 2017-02-24 LAB — COMPREHENSIVE METABOLIC PANEL
ALBUMIN: 3.6 g/dL (ref 3.5–5.0)
ALK PHOS: 53 U/L (ref 38–126)
ALT: 37 U/L (ref 14–54)
AST: 46 U/L — AB (ref 15–41)
Anion gap: 11 (ref 5–15)
BUN: 9 mg/dL (ref 6–20)
CHLORIDE: 99 mmol/L — AB (ref 101–111)
CO2: 26 mmol/L (ref 22–32)
CREATININE: 0.7 mg/dL (ref 0.44–1.00)
Calcium: 8.5 mg/dL — ABNORMAL LOW (ref 8.9–10.3)
GFR calc non Af Amer: >60 mL/min (ref >60)
GLUCOSE: 236 mg/dL — AB (ref 65–99)
Potassium: 3.7 mmol/L (ref 3.5–5.1)
SODIUM: 136 mmol/L (ref 135–145)
Total Bilirubin: 0.5 mg/dL (ref 0.3–1.2)
Total Protein: 6.9 g/dL (ref 6.5–8.1)

## 2017-02-24 LAB — CBC WITH DIFFERENTIAL/PLATELET
BASOS ABS: 0 10*3/uL (ref 0.0–0.1)
Basophils Relative: 0 %
EOS ABS: 0.1 10*3/uL (ref 0.0–0.7)
Eosinophils Relative: 2 %
HCT: 37.1 % (ref 36.0–46.0)
HEMOGLOBIN: 11.7 g/dL — AB (ref 12.0–15.0)
Lymphocytes Relative: 23 %
Lymphs Abs: 1.4 10*3/uL (ref 0.7–4.0)
MCH: 29.1 pg (ref 26.0–34.0)
MCHC: 31.5 g/dL (ref 30.0–36.0)
MCV: 92.3 fL (ref 78.0–100.0)
Monocytes Absolute: 0.7 10*3/uL (ref 0.1–1.0)
Monocytes Relative: 12 %
NEUTROS PCT: 63 %
Neutro Abs: 3.8 10*3/uL (ref 1.7–7.7)
Platelets: 213 10*3/uL (ref 150–400)
RBC: 4.02 MIL/uL (ref 3.87–5.11)
RDW: 15.9 % — ABNORMAL HIGH (ref 11.5–15.5)
WBC: 6 10*3/uL (ref 4.0–10.5)

## 2017-02-24 MED ORDER — DEXAMETHASONE SODIUM PHOSPHATE 10 MG/ML IJ SOLN
INTRAMUSCULAR | Status: AC
  Filled 2017-02-24: qty 1

## 2017-02-24 MED ORDER — PALONOSETRON HCL INJECTION 0.25 MG/5ML
0.2500 mg | Freq: Once | INTRAVENOUS | Status: AC
  Administered 2017-02-24: 0.25 mg via INTRAVENOUS

## 2017-02-24 MED ORDER — LEUCOVORIN CALCIUM INJECTION 350 MG
404.0000 mg/m2 | Freq: Once | INTRAVENOUS | Status: AC
  Administered 2017-02-24: 800 mg via INTRAVENOUS
  Filled 2017-02-24: qty 5

## 2017-02-24 MED ORDER — FLUOROURACIL CHEMO INJECTION 2.5 GM/50ML
400.0000 mg/m2 | Freq: Once | INTRAVENOUS | Status: AC
  Administered 2017-02-24: 800 mg via INTRAVENOUS
  Filled 2017-02-24: qty 16

## 2017-02-24 MED ORDER — INSULIN ASPART 100 UNIT/ML ~~LOC~~ SOLN
4.0000 [IU] | Freq: Once | SUBCUTANEOUS | Status: AC
  Administered 2017-02-24: 4 [IU] via SUBCUTANEOUS
  Filled 2017-02-24: qty 0.04

## 2017-02-24 MED ORDER — SODIUM CHLORIDE 0.9 % IV SOLN
2400.0000 mg/m2 | INTRAVENOUS | Status: DC
  Administered 2017-02-24: 4750 mg via INTRAVENOUS
  Filled 2017-02-24: qty 95

## 2017-02-24 MED ORDER — DEXAMETHASONE SODIUM PHOSPHATE 10 MG/ML IJ SOLN
10.0000 mg | Freq: Once | INTRAMUSCULAR | Status: AC
  Administered 2017-02-24: 10 mg via INTRAVENOUS

## 2017-02-24 MED ORDER — DEXTROSE 5 % IV SOLN
Freq: Once | INTRAVENOUS | Status: AC
  Administered 2017-02-24: 11:00:00 via INTRAVENOUS

## 2017-02-24 MED ORDER — PALONOSETRON HCL INJECTION 0.25 MG/5ML
INTRAVENOUS | Status: AC
  Filled 2017-02-24: qty 5

## 2017-02-24 MED ORDER — OXALIPLATIN CHEMO INJECTION 100 MG/20ML
85.0000 mg/m2 | Freq: Once | INTRAVENOUS | Status: AC
  Administered 2017-02-24: 170 mg via INTRAVENOUS
  Filled 2017-02-24: qty 34

## 2017-02-24 NOTE — Assessment & Plan Note (Addendum)
Iron deficiency anemia, secondary to chronic GI blood loss from colon cancer.  On PO Niferex beginning in September 2017.  Tolerating PO iron well with only the complaint of malodorous anal flatulence.  Labs in 2 weeks: CBC diff, iron/TIBC, ferritin.    She will continue with PO iron.  I have recommended the addition of [phone] mg of Vit C to increase iron absorption.  Plan is to D/C PO iron when HGB is normal and iron studies are normal with ferritin > 100.

## 2017-02-24 NOTE — Patient Instructions (Signed)
[facility] Discharge Instructions for Patients Receiving Chemotherapy   Beginning January 23rd 2017 lab work for the [facility] will be done in the  Main lab at [facility] on 1st floor. If you have a lab appointment with the [facility] please come in thru the  Main Entrance and check in at the main information desk   Today you received the following chemotherapy agents:  oxaliplatin, leucovorin, and 86fu  If you develop nausea and vomiting, or diarrhea that is not controlled by your medication, call the clinic.  The clinic phone number is [phone]. Office hours are Monday-Friday 8:30am-5:00pm.  BELOW ARE SYMPTOMS THAT SHOULD BE REPORTED IMMEDIATELY:  *FEVER GREATER THAN 101.0 F  *CHILLS WITH OR WITHOUT FEVER  NAUSEA AND VOMITING THAT IS NOT CONTROLLED WITH YOUR NAUSEA MEDICATION  *UNUSUAL SHORTNESS OF BREATH  *UNUSUAL BRUISING OR BLEEDING  TENDERNESS IN MOUTH AND THROAT WITH OR WITHOUT PRESENCE OF ULCERS  *URINARY PROBLEMS  *BOWEL PROBLEMS  UNUSUAL RASH Items with * indicate a potential emergency and should be followed up as soon as possible. If you have an emergency after office hours please contact your primary care physician or go to the nearest emergency department.  Please call the clinic during office hours if you have any questions or concerns.   You may also contact the Patient Navigator at [phone] should you have any questions or need assistance in obtaining follow up care.      Resources For Cancer Patients and their Caregivers ? American Cancer Society: Can assist with transportation, wigs, general needs, runs Look Good Feel Better.        786-069-2324 ? Cancer Care: Provides financial assistance, online support groups, medication/co-pay assistance.  1-800-813-HOPE 360-465-3355) ? [facility] Assists [place] cancer patients and their families through emotional , educational and financial support.   929 411 9401 ? [place] DSS Where to apply for food stamps, Medicaid and utility assistance. (364)844-3190 ? RCATS: Transportation to medical appointments. 8102110228 ? Social Security Administration: May apply for disability if have a Stage IV cancer. 3461757729 949-351-5309 ? [employer], Disability and Transit Services: Assists with nutrition, care and transit needs. [phone]

## 2017-02-24 NOTE — Patient Instructions (Signed)
[place] at [facility] Discharge Instructions  RECOMMENDATIONS MADE BY THE CONSULTANT AND ANY TEST RESULTS WILL BE SENT TO YOUR REFERRING PHYSICIAN.  You were seen today by Kirby Crigler PA-C. Treatment today. Labs and treatment in 2 weeks and 4 weeks. Return in 4 weeks for follow up.   Thank you for choosing [place] at [facility] to provide your oncology and hematology care.  To afford each patient quality time with our provider, please arrive at least 15 minutes before your scheduled appointment time.    If you have a lab appointment with the [facility] please come in thru the  Main Entrance and check in at the main information desk  You need to re-schedule your appointment should you arrive 10 or more minutes late.  We strive to give you quality time with our providers, and arriving late affects you and other patients whose appointments are after yours.  Also, if you no show three or more times for appointments you may be dismissed from the clinic at the providers discretion.     Again, thank you for choosing [facility].  Our hope is that these requests will decrease the amount of time that you wait before being seen by our physicians.       _____________________________________________________________  Should you have questions after your visit to [facility], please contact our office at [phone] between the hours of 8:30 a.m. and 4:30 p.m.  Voicemails left after 4:30 p.m. will not be returned until the following business day.  For prescription refill requests, have your pharmacy contact our office.       Resources For Cancer Patients and their Caregivers ? American Cancer Society: Can assist with transportation, wigs, general needs, runs Look Good Feel Better.        ([phone] ? Cancer Care: Provides financial assistance, online support groups, medication/co-pay assistance.   1-800-813-HOPE 636-161-6143) ? [facility] Assists [place] cancer patients and their families through emotional , educational and financial support.  613 586 6182 ? [place] DSS Where to apply for food stamps, Medicaid and utility assistance. 405-794-8638 ? RCATS: Transportation to medical appointments. (904)020-2075 ? Social Security Administration: May apply for disability if have a Stage IV cancer. ([phone] 414-885-6682 ? [employer], Disability and Transit Services: Assists with nutrition, care and transit needs. [place] Support Programs: @10RELATIVEDAYS @ > Cancer Support Group  2nd Tuesday of the month 1pm-2pm, Journey Room  > Creative Journey  3rd Tuesday of the month 1130am-1pm, Journey Room  > Look Good Feel Better  1st Wednesday of the month 10am-12 noon, Journey Room (Call [place] to register [phone])

## 2017-02-24 NOTE — Progress Notes (Signed)
Labs reviewed with T. Kefalas, PA-C.  Okay to tx today per PA.  Order rec'd for Novolog 4 units [place] for blood glucose of 236.   Tolerated tx w/o adverse reaction.  Alert, in no distress.  VSS.  Discharged ambulatory in c/o family.

## 2017-02-24 NOTE — Progress Notes (Signed)
Janet Hanson, [place] [id number]  Cancer of right colon [facility]) - Plan: CBC with Differential, Comprehensive metabolic panel, CEA, CBC with Differential, Comprehensive metabolic panel, CBC with Differential, Comprehensive metabolic panel, CEA  Iron deficiency anemia due to chronic blood loss - Plan: CBC with Differential, Iron and TIBC, Ferritin  Vitamin B12 deficiency  CURRENT THERAPY: Palliative FOLFOX beginning on 02/10/2017.  Avastin added for cycle #3.  PO iron replacement therapy.  B12 SL daily.  INTERVAL HISTORY: Janet Hanson 71 y.o. 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  After review of risk factors (No LVI, no PNI, moderately differentiated, 14 negative lymph nodes, and T3 tumor, BUT with obstruction and high pre-operative CEA), good prognostic indicators, and low benefit to survival with adjuvant chemotherapy, patient opted to not pursue treatment.  Patients without high-risk features who have MSI-unstable (MSI-H)/dMMR tumors have a favorable prognosis and are not likely to derive significant benefit from adjuvant fluoropyrimidine-based therapy.  AND Iron deficiency anemia, secondary to chronic GI blood loss from colon cancer.  AND B12 deficiency with negative intrinsic factor and anti-parietal cell antibody testing on 08/19/2016.  On oral B12 SL daily.    Cancer of right colon ([place])   06/24/2016 Imaging    CT C/A/P There is a large annular constricting mass involving the mid aspect of the ascending colon most  compatible with colonic carcinoma. There is a small adjacent foci of gas and free fluid raising the possibility of micro perforation. There is marked dilatation of the cecum and small bowel with fluid and gas most compatible with proximal obstruction caused by the large ascending colonic mass. Additionally there is a 6 cm fatty mass involving the colon at the level the hepatic flexure, most suggestive of a lipoma. Aortic vascular calcifications. Sigmoid colonic diverticulosis without evidence for acute diverticulitis.No specific findings identified to suggest metastatic disease to the chest.There is a 3 mm nodule identified within the left upper lobe, nonspecific.Bilateral pleural effusions with overlying compressive type atelectasis and consolidation       06/26/2016 Tumor Marker    CEA 39.3 ng/ml      06/27/2016 Surgery    Partial colectomy with primary linear stapled anastomosis.  Large bulky mid-ascending colonic mass extending into the colonic mesentery and retroperitoneum with a layer of not visibly/palpably involved fat between the colon and underlying visualized and exposed duodenum and Right kidney      06/27/2016 Pathology Results    COLONIC ADENOCARCINOMA (8.5 CM), GRADE 2 THE TUMOR INVADES THROUGH THE MUSCULARIS PROPRIA INTO PERICOLONIC TISSUE  ALL MARGINS OF RESECTION ARE NEGATIVE FOR CARCINOMA FOURTEEN BENIGN LYMPH NODES (0/14) TUBULAR ADENOMA (X3) UNREMARKABLE APPENDIX      06/30/2016 Imaging    CT angio chest Positive examination for right upper lobe and right lower lobe pulmonary emboli. No evidence of right heart strain. Again demonstrated are bilateral pleural effusions and basilar atelectasis, greater on the right.      07/30/2016 Tumor Marker      Ref. Range 07/30/2016 16:46  CEA Latest Ref Range: 0.0 - 4.7 ng/mL 4.0  07/30/2016 Miscellaneous    Medical Oncology consultation.  No adjuvant chemotherapy needed based upon risk factors and small  benefit.  Informed decision made by patient to not pursue systemic chemotherapy in adjuvant setting.      11/05/2016 Procedure    Colonoscopy by Dr. Laural Golden.  soft anal tags found on digital rectal exam. - Patent end-to-side colo-colonic anastomosis, characterized by healthy appearing mucosa. - Three 5 to 7 mm polyps in the proximal transverse colon, removed with a cold snare. Resected and retrieved. - One 10 mm polyp in the proximal transverse colon, removed with a hot snare. Resected and retrieved. - One 20 mm polyp in the proximal sigmoid colon, removed with a hot snare. Resected and retrieved. Clip (MR conditional) was placed. - One 15 to 25 mm polyp at the recto-sigmoid colon, removed with a hot snare. Resected and retrieved. Clip (MR conditional) was placed. - Diverticulosis in the sigmoid colon and in the descending colon. - The distal rectum and anal verge are normal on retroflexion view.      11/07/2016 Pathology Results    1. Colon, polyp(s), transverse proximal - TUBULAR ADENOMA(S). - HIGH GRADE DYSPLASIA IS NOT IDENTIFIED. 2. Colon, polyp(s), proximal sigmoid - TUBULOVILLOUS ADENOMA WITH FOCAL HIGH GRADE DYSPLASIA (5%). 3. Rectosigmoid , polyp - TUBULOVILLOUS ADENOMA WITH HIGH GRADE DYSPLASIA (15%).      12/26/2016 Imaging    CT abd/pelvis-1. Interval development of a 2.4 cm rim enhancing lesion in the lateral segment left liver, highly suspicious for metastatic disease. 2. New abnormal irregular soft tissue in the cul-de-sac, involving the anterior wall of the rectum and posterior cervix. This is highly suspicious for metastatic involvement (drop lesion). 3. Interval Right hemicolectomy. 4.  Abdominal Aortic Atherosclerois (ICD10-170.0)      01/16/2017 Procedure    IR biopsy of hepatic lesion      01/19/2017 Pathology Results    Liver, needle/core biopsy, Left Lobe - ADENOCARCINOMA Microscopic Comment The morphology is consistent with metastatic colorectal  adenocarcinoma. There is sufficient tissue for additional studies. Called to [facility] Crigler on 01/19/17      02/06/2017 Imaging    CT chest- 1. Stable CT chest. No substantial change in the 7 mm short axis left paraesophageal lymph node. 2. Rim enhancing lesion lateral segment left liver has progressed in the interval      02/09/2017 Procedure    Port-A-Cath insertion by Dr. Arnoldo Morale      02/10/2017 -  Chemotherapy    The patient had palonosetron (ALOXI) injection 0.25 mg, 0.25 mg, Intravenous,  Once, 1 of 4 cycles  leucovorin 800 mg in dextrose 5 % 250 mL infusion, 404 mg/m2 = 792 mg, Intravenous,  Once, 1 of 4 cycles  oxaliplatin (ELOXATIN) 170 mg in dextrose 5 % 500 mL chemo infusion, 85 mg/m2 = 170 mg, Intravenous,  Once, 1 of 4 cycles  fluorouracil (ADRUCIL) chemo injection 800 mg, 400 mg/m2 = 800 mg, Intravenous,  Once, 1 of 4 cycles  fluorouracil (ADRUCIL) 4,750 mg in sodium chloride 0.9 % 55 mL chemo infusion, 2,400 mg/m2 = 4,750 mg, Intravenous, 1 Day/Dose, 1 of 4 cycles  for chemotherapy treatment.        She tolerated her first treatment well without any complications.  She notes some issues with cold intolerance that lasted approximately 24-48 hours.  She notes that it is very manageable.  No intervention is needed at this time.  It has since resolved.  She denies any signs or symptoms of peripheral neuropathy.  She  denies any numbness and tingling of her fingertips or toe tips.  She is asked to be on the look out for this common side effect of oxaliplatin-based chemotherapy.  Her sugar is a little elevated today.  We will give her 6 units of insulin in the clinic.  She does have follow-up appointment upcoming this month with her primary care provider.  I suspect she will want to investigate this issue further.  Patient is educated that we do not require fasting blood work and therefore the glucose checks that we ascertain may not be diagnostic for diabetes.  Appetite is  stable.  Weight is stable.  Review of Systems  Constitutional: Negative.  Negative for chills, fever and weight loss.  HENT: Negative.   Eyes: Negative.   Respiratory: Negative.  Negative for cough.   Cardiovascular: Negative.  Negative for chest pain.  Gastrointestinal: Negative.  Negative for blood in stool, constipation, diarrhea, melena, nausea and vomiting.  Genitourinary: Negative.   Musculoskeletal: Negative.   Skin: Negative.   Neurological: Negative.  Negative for weakness.  Endo/Heme/Allergies: Negative.   Psychiatric/Behavioral: Negative.     Past Medical History:  Diagnosis Date  . Cancer of right colon ([place]) 07/30/2016  . Colon cancer ([place])   . Depression   . Hypercholesterolemia   . Hypertension   . Iron deficiency anemia due to chronic blood loss 07/30/2016  . Vitamin B12 deficiency 08/13/2016    Past Surgical History:  Procedure Laterality Date  . COLONOSCOPY N/A 11/05/2016   Procedure: COLONOSCOPY;  Surgeon: Rogene Houston, MD;  Location: [facility] ENDO SUITE;  Service: Endoscopy;  Laterality: N/A;  1:20  . PARTIAL COLECTOMY N/A 06/27/2016   Procedure: PARTIAL COLECTOMY;  Surgeon: Vickie Epley, MD;  Location: [facility] ORS;  Service: General;  Laterality: N/A;  . PORTACATH PLACEMENT Right 02/09/2017   Procedure: INSERTION PORT-A-CATH;  Surgeon: Aviva Signs, MD;  Location: [facility] ORS;  Service: General;  Laterality: Right;  right subclavian    Family History  Problem Relation Age of Onset  . Hypertension Mother   . Other Father     some type of "stomach ailment"  . Suicidality Brother   . Stomach cancer Maternal Aunt   . Brain cancer Brother     Social History   Social History  . Marital status: Widowed    Spouse name: N/A  . Number of children: N/A  . Years of education: N/A   Social History Main Topics  . Smoking status: Former Smoker    Packs/day: 0.50    Years: 48.00    Types: Cigarettes    Quit date: 06/24/2016  . Smokeless tobacco: Never Used      Comment: quit smoking Sept 2017  . Alcohol use No     Comment: glass wine  twice per year  . Drug use: No  . Sexual activity: No     Comment: widowed- 1 daughter   Other Topics Concern  . None   Social History Narrative  . None     PHYSICAL EXAMINATION  ECOG PERFORMANCE STATUS: 1 - Symptomatic but completely ambulatory  There were no vitals filed for this visit.  Vitals - 1 value per visit 9/38/1017  SYSTOLIC 510  DIASTOLIC 90  Pulse 258  Temperature 98.4  Respirations 20  Weight (lb) 198    GENERAL:alert, no distress, well nourished, well developed, comfortable, cooperative, obese, smiling and in chemo-recliner, accompanied by daughter. SKIN: skin color, texture, turgor are normal, no rashes or significant lesions HEAD: Normocephalic,  No masses, lesions, tenderness or abnormalities EYES: normal, EOMI, Conjunctiva are pink and non-injected EARS: External ears normal OROPHARYNX:lips, buccal mucosa, and tongue normal and mucous membranes are moist  NECK: supple, trachea midline LYMPH:  no palpable lymphadenopathy BREAST:not examined LUNGS: clear to auscultation and percussion HEART: regular rate & rhythm, no murmurs and no gallops ABDOMEN:abdomen soft, obese and normal bowel sounds BACK: Back symmetric, no curvature. EXTREMITIES:less then 2 second capillary refill, no joint deformities, effusion, or inflammation, no skin discoloration, no cyanosis  NEURO: alert & oriented x 3 with fluent speech, no focal motor/sensory deficits, gait normal   LABORATORY DATA: CBC    Component Value Date/Time   WBC 6.0 02/24/2017 0935   RBC 4.02 02/24/2017 0935   HGB 11.7 (L) 02/24/2017 0935   HCT 37.1 02/24/2017 0935   PLT 213 02/24/2017 0935   MCV 92.3 02/24/2017 0935   MCH 29.1 02/24/2017 0935   MCHC 31.5 02/24/2017 0935   RDW 15.9 (H) 02/24/2017 0935   LYMPHSABS 1.4 02/24/2017 0935   MONOABS 0.7 02/24/2017 0935   EOSABS 0.1 02/24/2017 0935   BASOSABS 0.0 02/24/2017 0935       Chemistry      Component Value Date/Time   NA 136 02/24/2017 0935   K 3.7 02/24/2017 0935   CL 99 (L) 02/24/2017 0935   CO2 26 02/24/2017 0935   BUN 9 02/24/2017 0935   CREATININE 0.70 02/24/2017 0935      Component Value Date/Time   CALCIUM 8.5 (L) 02/24/2017 0935   ALKPHOS 53 02/24/2017 0935   AST 46 (H) 02/24/2017 0935   ALT 37 02/24/2017 0935   BILITOT 0.5 02/24/2017 0935     Lab Results  Component Value Date   CEA 35.2 (H) 02/10/2017   Lab Results  Component Value Date   IRON 38 02/10/2017   TIBC 440 02/10/2017   FERRITIN 26 02/10/2017    PENDING LABS:   RADIOGRAPHIC STUDIES:  Ct Chest W Contrast  Result Date: 02/06/2017 CLINICAL DATA:  Colon cancer with liver metastases. EXAM: CT CHEST WITH CONTRAST TECHNIQUE: Multidetector CT imaging of the chest was performed during intravenous contrast administration. CONTRAST:  75 cc Isovue 370 COMPARISON:  Abdomen and pelvis CT 12/26/2016.  Chest CT 06/22/2016 FINDINGS: Cardiovascular: Heart size normal. Coronary artery calcification is noted. Atherosclerotic calcification is noted in the wall of the thoracic aorta. Mediastinum/Nodes: 6 mm low paraesophageal lymph nodes seen on the previous study is 7 mm short axis today. Otherwise no mediastinal lymphadenopathy. There is no hilar lymphadenopathy. The esophagus has normal imaging features. There is no axillary lymphadenopathy. Lungs/Pleura: 3 mm, measuring 4.0 cm today compared to 2.4 cm previously. Left lower lobe nodule (image 48 series 5) is stable subsegmental atelectasis noted right middle lobe and lingula. No focal airspace consolidation. No pulmonary edema or pleural effusion. Upper Abdomen: Rim enhancing lesion in the lateral segment of the left liver appears of progressed in the interval measuring 4 cm today compared to 2.4 cm previously Musculoskeletal: Bone windows reveal no worrisome lytic or sclerotic osseous lesions. IMPRESSION: 1. Stable CT chest. No substantial  change in the 7 mm short axis left paraesophageal lymph node. 2. Rim enhancing lesion lateral segment left liver has progressed in the interval Electronically Signed   By: Misty Stanley M.D.   On: 02/06/2017 12:18   Dg Chest Port 1 View  Result Date: 02/09/2017 CLINICAL DATA:  S post Port-A-Cath placement on the right EXAM: PORTABLE CHEST 1 VIEW COMPARISON:  CT scan chest  of February 06, 2017 FINDINGS: The power port catheter tip projects over the proximal third of the SVC. There is no postprocedure pneumothorax or hemothorax. The lungs are well-expanded. The interstitial markings are coarse. The cardiac silhouette is enlarged. The trachea is midline. There is calcification in the wall of the aortic arch. The bony thorax exhibits no acute abnormality. IMPRESSION: COPD. No postprocedure complication following Port-A-Cath appliance placement. Thoracic aortic atherosclerosis. Electronically Signed   By: David  Martinique M.D.   On: 02/09/2017 12:14   Dg C-arm 1-60 Min-no Report  Result Date: 02/09/2017 Fluoroscopy was utilized by the requesting physician.  No radiographic interpretation.     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  After review of risk factors (No LVI, no PNI, moderately differentiated, 14 negative lymph nodes, and T3 tumor, BUT with obstruction and high pre-operative CEA), good prognostic indicators, and low benefit to survival with adjuvant chemotherapy, patient opted to not pursue treatment.  Patients without high-risk features who have  MSI-unstable (MSI-H)/dMMR tumors have a favorable prognosis and are not likely to derive significant benefit from adjuvant fluoropyrimidine-based therapy. Currently on palliative FOLFOX chemotherapy.  Oncology history updated.  Pre-treatment labs today: CBC diff, CMET, CEA.  I personally reviewed and went over laboratory results with the patient.  The results are noted within this dictation.  Hyperglycemia is noted today.  6 units of insulin is given in the clinic today.  She has an upcoming appointment with her primary care provider.  I suspect her primary care provider would like to investigate this issue further.  The patient is advised that we are happy to perform requested blood work by her primary care provider since she will be having regular lab work performed by [place].  Pre-treatment labs in 2 weeks: CBC diff, CMET. Pre-treatment labs in 4 weeks: CBC diff, CMET, CEA.  I personally reviewed and went over radiographic studies with the patient.  The results are noted within this dictation.  I have reviewed CT imaging with her and reminded her of her previous CT abd/pelvis results: demonstrate a new 2.4 left liver lesion concerning for metastatic disease and an irregular soft tissue in the cul-de-sac involving the anterior wall of rectum and posterior cervix, also suspicious for metastatic disease (drop lesion).  CT of chest is negative for metastatic disease above the diaphragm.  I personally reviewed and went over pathology results with the patient.  It does not appear that pathology was sent for molecular testing.  I will call pathology and request [employer] testing be performed to help guide future treatment options.  I have made contact with Tammy in Pathology and she confirms that she will request [employer] testing.   She has been started on FOLFOX WITHOUT Avastin.  I have ADDED AVASTIN to her treatment plan beginning with cycle #3 which will be 28 days following port placement.  She  has already have chemotherapy teaching for Avastin.  I briefly reviewed the risks, benefits, alternatives, and side effects of Avastin therapy including, but not limited to, increased risk for bleeding, bowel perforation, etc.  She is up-to-date  on colonoscopy with last one being on 11/05/2016 by Dr. Laural Golden.  Return in 2 weeks for follow-up with ongoing treatment.  Iron deficiency anemia due to chronic blood loss Iron deficiency anemia, secondary to chronic GI blood loss from colon cancer.  On PO Niferex beginning in September 2017.  Tolerating PO iron well with only the complaint of malodorous anal flatulence.  Labs in 2 weeks: CBC diff, iron/TIBC, ferritin.    She will continue with PO iron.  I have recommended the addition of [phone] mg of Vit C to increase iron absorption.  Plan is to D/C PO iron when HGB is normal and iron studies are normal with ferritin > 100.  Vitamin B12 deficiency B12 deficiency with negative intrinsic factor and anti-parietal cell antibody testing on 08/19/2016.  On oral B12 replacement therapy, SL.   ORDERS PLACED FOR THIS ENCOUNTER: Orders Placed This Encounter  Procedures  . CBC with Differential  . Comprehensive metabolic panel  . CEA  . CBC with Differential  . Comprehensive metabolic panel  . Iron and TIBC  . Ferritin  . CBC with Differential  . Comprehensive metabolic panel  . CEA    MEDICATIONS PRESCRIBED THIS ENCOUNTER: No orders of the defined types were placed in this encounter.   THERAPY PLAN:  Continue with palliative chemotherapy: FOLFOX + Avastin.  All questions were answered. The patient knows to call the clinic with any problems, questions or concerns. We can certainly see the patient much sooner if necessary.  Patient and plan discussed with Dr. Twana First and she is in agreement with the aforementioned.   This note is electronically signed by: Doy Mince 02/24/2017 11:57 AM

## 2017-02-24 NOTE — Assessment & Plan Note (Signed)
B12 deficiency with negative intrinsic factor and anti-parietal cell antibody testing on 08/19/2016.  On oral B12 replacement therapy, SL.

## 2017-02-24 NOTE — Assessment & Plan Note (Addendum)
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  After review of risk factors (No LVI, no PNI, moderately differentiated, 14 negative lymph nodes, and T3 tumor, BUT with obstruction and high pre-operative CEA), good prognostic indicators, and low benefit to survival with adjuvant chemotherapy, patient opted to not pursue treatment.  Patients without high-risk features who have MSI-unstable (MSI-H)/dMMR tumors have a favorable prognosis and are not likely to derive significant benefit from adjuvant fluoropyrimidine-based therapy. Currently on palliative FOLFOX chemotherapy.  Oncology history updated.  Pre-treatment labs today: CBC diff, CMET, CEA.  I personally reviewed and went over laboratory results with the patient.  The results are noted within this dictation.  Hyperglycemia is noted today.  6 units of insulin is given in the clinic today.  She has an upcoming appointment with her primary care provider.  I suspect her primary care provider would like to investigate this issue further.  The patient is advised that we are happy to perform requested blood work by her primary care provider since she will be having regular lab work performed by [place].  Pre-treatment labs in 2 weeks: CBC diff, CMET. Pre-treatment labs in 4 weeks: CBC diff, CMET, CEA.  I personally reviewed and went over radiographic studies with the patient.  The results are noted within this dictation.  I have reviewed CT imaging with her and reminded her of her previous CT abd/pelvis results: demonstrate a new 2.4 left liver lesion concerning for metastatic  disease and an irregular soft tissue in the cul-de-sac involving the anterior wall of rectum and posterior cervix, also suspicious for metastatic disease (drop lesion).  CT of chest is negative for metastatic disease above the diaphragm.  I personally reviewed and went over pathology results with the patient.  It does not appear that pathology was sent for molecular testing.  I will call pathology and request [employer] testing be performed to help guide future treatment options.  I have made contact with Tammy in Pathology and she confirms that she will request [employer] testing.   She has been started on FOLFOX WITHOUT Avastin.  I have ADDED AVASTIN to her treatment plan beginning with cycle #3 which will be 28 days following port placement.  She has already have chemotherapy teaching for Avastin.  I briefly reviewed the risks, benefits, alternatives, and side effects of Avastin therapy including, but not limited to, increased risk for bleeding, bowel perforation, etc.  She is up-to-date on colonoscopy with last one being on 11/05/2016 by Dr. Laural Golden.  Return in 2 weeks for follow-up with ongoing treatment.

## 2017-02-25 LAB — CEA: CEA: 31.4 ng/mL — AB (ref 0.0–4.7)

## 2017-02-26 ENCOUNTER — Encounter (HOSPITAL_BASED_OUTPATIENT_CLINIC_OR_DEPARTMENT_OTHER): Payer: Medicare Other

## 2017-02-26 VITALS — BP 113/76 | HR 98 | Temp 98.8°F | Resp 20

## 2017-02-26 DIAGNOSIS — C182 Malignant neoplasm of ascending colon: Secondary | ICD-10-CM | POA: Diagnosis present

## 2017-02-26 MED ORDER — SODIUM CHLORIDE 0.9% FLUSH
10.0000 mL | INTRAVENOUS | Status: DC | PRN
  Administered 2017-02-26: 10 mL
  Filled 2017-02-26: qty 10

## 2017-02-26 MED ORDER — HEPARIN SOD (PORK) LOCK FLUSH 100 UNIT/ML IV SOLN
500.0000 [IU] | Freq: Once | INTRAVENOUS | Status: AC | PRN
  Administered 2017-02-26: 500 [IU]
  Filled 2017-02-26: qty 5

## 2017-02-26 NOTE — Progress Notes (Signed)
Janet Hanson presented for Portacath deaccess, flush, and pump removal. Portacath located rt chest wall.  Good blood return present. Portacath flushed with 63ml NS and 500U/61ml Heparin and needle removed intact. Procedure without incident. Patient tolerated procedure well.  Pt had blood coming from port post needle removal. Blood didn't really come out except when you squeezed around the port however a fair amount of blood came out when you pressed against the side of the port. I placed gauze over site and a bandaid. Port site is wnl and does not look infected. Pt does state that she is on Coumadin.

## 2017-03-10 ENCOUNTER — Encounter (HOSPITAL_COMMUNITY): Payer: Medicare Other | Attending: Hematology & Oncology

## 2017-03-10 ENCOUNTER — Encounter (HOSPITAL_COMMUNITY): Payer: Self-pay

## 2017-03-10 VITALS — BP 120/74 | HR 100 | Temp 98.2°F | Resp 18 | Wt 193.2 lb

## 2017-03-10 DIAGNOSIS — Z87891 Personal history of nicotine dependence: Secondary | ICD-10-CM | POA: Insufficient documentation

## 2017-03-10 DIAGNOSIS — Z5111 Encounter for antineoplastic chemotherapy: Secondary | ICD-10-CM

## 2017-03-10 DIAGNOSIS — D500 Iron deficiency anemia secondary to blood loss (chronic): Secondary | ICD-10-CM | POA: Diagnosis present

## 2017-03-10 DIAGNOSIS — C182 Malignant neoplasm of ascending colon: Secondary | ICD-10-CM | POA: Diagnosis present

## 2017-03-10 DIAGNOSIS — Z79899 Other long term (current) drug therapy: Secondary | ICD-10-CM | POA: Diagnosis not present

## 2017-03-10 DIAGNOSIS — Z5112 Encounter for antineoplastic immunotherapy: Secondary | ICD-10-CM | POA: Diagnosis present

## 2017-03-10 LAB — FERRITIN: FERRITIN: 281 ng/mL (ref 11–307)

## 2017-03-10 LAB — URINALYSIS, DIPSTICK ONLY
BILIRUBIN URINE: NEGATIVE
Glucose, UA: NEGATIVE mg/dL
HGB URINE DIPSTICK: NEGATIVE
NITRITE: NEGATIVE
PROTEIN: NEGATIVE mg/dL
SPECIFIC GRAVITY, URINE: 1.02 (ref 1.005–1.030)
pH: 5.5 (ref 5.0–8.0)

## 2017-03-10 LAB — CBC WITH DIFFERENTIAL/PLATELET
BASOS PCT: 1 %
Basophils Absolute: 0 10*3/uL (ref 0.0–0.1)
EOS ABS: 0.2 10*3/uL (ref 0.0–0.7)
EOS PCT: 3 %
HCT: 36.6 % (ref 36.0–46.0)
Hemoglobin: 11.7 g/dL — ABNORMAL LOW (ref 12.0–15.0)
LYMPHS ABS: 1.8 10*3/uL (ref 0.7–4.0)
Lymphocytes Relative: 28 %
MCH: 29.9 pg (ref 26.0–34.0)
MCHC: 32 g/dL (ref 30.0–36.0)
MCV: 93.6 fL (ref 78.0–100.0)
MONOS PCT: 12 %
Monocytes Absolute: 0.8 10*3/uL (ref 0.1–1.0)
Neutro Abs: 3.8 10*3/uL (ref 1.7–7.7)
Neutrophils Relative %: 56 %
PLATELETS: 189 10*3/uL (ref 150–400)
RBC: 3.91 MIL/uL (ref 3.87–5.11)
RDW: 17.5 % — AB (ref 11.5–15.5)
WBC: 6.6 10*3/uL (ref 4.0–10.5)

## 2017-03-10 LAB — COMPREHENSIVE METABOLIC PANEL
ALBUMIN: 3.6 g/dL (ref 3.5–5.0)
ALK PHOS: 56 U/L (ref 38–126)
ALT: 33 U/L (ref 14–54)
ANION GAP: 9 (ref 5–15)
AST: 43 U/L — ABNORMAL HIGH (ref 15–41)
BUN: 6 mg/dL (ref 6–20)
CALCIUM: 8.4 mg/dL — AB (ref 8.9–10.3)
CHLORIDE: 99 mmol/L — AB (ref 101–111)
CO2: 28 mmol/L (ref 22–32)
CREATININE: 0.69 mg/dL (ref 0.44–1.00)
GFR calc non Af Amer: >60 mL/min (ref >60)
GLUCOSE: 174 mg/dL — AB (ref 65–99)
POTASSIUM: 3.8 mmol/L (ref 3.5–5.1)
SODIUM: 136 mmol/L (ref 135–145)
Total Bilirubin: 0.9 mg/dL (ref 0.3–1.2)
Total Protein: 6.6 g/dL (ref 6.5–8.1)

## 2017-03-10 LAB — IRON AND TIBC
Iron: 52 ug/dL (ref 28–170)
SATURATION RATIOS: 13 % (ref 10.4–31.8)
TIBC: 412 ug/dL (ref 250–450)
UIBC: 360 ug/dL

## 2017-03-10 MED ORDER — DEXAMETHASONE SODIUM PHOSPHATE 10 MG/ML IJ SOLN
INTRAMUSCULAR | Status: AC
  Filled 2017-03-10: qty 1

## 2017-03-10 MED ORDER — PALONOSETRON HCL INJECTION 0.25 MG/5ML
INTRAVENOUS | Status: AC
  Filled 2017-03-10: qty 5

## 2017-03-10 MED ORDER — FLUOROURACIL CHEMO INJECTION 2.5 GM/50ML
400.0000 mg/m2 | Freq: Once | INTRAVENOUS | Status: AC
  Administered 2017-03-10: 800 mg via INTRAVENOUS
  Filled 2017-03-10: qty 16

## 2017-03-10 MED ORDER — OXALIPLATIN CHEMO INJECTION 100 MG/20ML
85.0000 mg/m2 | Freq: Once | INTRAVENOUS | Status: AC
  Administered 2017-03-10: 170 mg via INTRAVENOUS
  Filled 2017-03-10: qty 34

## 2017-03-10 MED ORDER — DEXTROSE 5 % IV SOLN
Freq: Once | INTRAVENOUS | Status: AC
  Administered 2017-03-10: 10:00:00 via INTRAVENOUS

## 2017-03-10 MED ORDER — PALONOSETRON HCL INJECTION 0.25 MG/5ML
0.2500 mg | Freq: Once | INTRAVENOUS | Status: AC
  Administered 2017-03-10: 0.25 mg via INTRAVENOUS

## 2017-03-10 MED ORDER — SODIUM CHLORIDE 0.9% FLUSH
10.0000 mL | INTRAVENOUS | Status: DC | PRN
  Administered 2017-03-10: 10 mL
  Filled 2017-03-10: qty 10

## 2017-03-10 MED ORDER — SODIUM CHLORIDE 0.9 % IV SOLN
INTRAVENOUS | Status: DC
  Administered 2017-03-10: 11:00:00 via INTRAVENOUS

## 2017-03-10 MED ORDER — SODIUM CHLORIDE 0.9 % IV SOLN
5.0000 mg/kg | Freq: Once | INTRAVENOUS | Status: DC
  Filled 2017-03-10: qty 18

## 2017-03-10 MED ORDER — BEVACIZUMAB CHEMO INJECTION 400 MG/16ML
5.0000 mg/kg | Freq: Once | INTRAVENOUS | Status: AC
  Administered 2017-03-10: 450 mg via INTRAVENOUS
  Filled 2017-03-10: qty 2

## 2017-03-10 MED ORDER — LEUCOVORIN CALCIUM INJECTION 350 MG
404.0000 mg/m2 | Freq: Once | INTRAVENOUS | Status: AC
  Administered 2017-03-10: 800 mg via INTRAVENOUS
  Filled 2017-03-10: qty 35

## 2017-03-10 MED ORDER — DEXAMETHASONE SODIUM PHOSPHATE 10 MG/ML IJ SOLN
10.0000 mg | Freq: Once | INTRAMUSCULAR | Status: AC
  Administered 2017-03-10: 10 mg via INTRAVENOUS

## 2017-03-10 MED ORDER — SODIUM CHLORIDE 0.9 % IV SOLN
2400.0000 mg/m2 | INTRAVENOUS | Status: DC
  Administered 2017-03-10: 4750 mg via INTRAVENOUS
  Filled 2017-03-10: qty 95

## 2017-03-10 NOTE — Progress Notes (Signed)
Urine sample sent to lab.  Chemotherapy given after labs reviewed by MD. Patient tolerated it will without complaints. Vitals stable and discharged home with the continuous 5FU pump connected per protocol. Patient left ambulatory with daughter at her side.follow up as scheduled.

## 2017-03-10 NOTE — Patient Instructions (Signed)
[facility] Discharge Instructions for Patients Receiving Chemotherapy   Beginning January 23rd 2017 lab work for the Cancer Center will be done in the  Main lab at [place] on 1st floor. If you have a lab appointment with the Cancer Center please come in thru the  Main Entrance and check in at the main information desk   Today you received the following chemotherapy agents   To help prevent nausea and vomiting after your treatment, we encourage you to take your nausea medication     If you develop nausea and vomiting, or diarrhea that is not controlled by your medication, call the clinic.  The clinic phone number is [phone]. Office hours are Monday-Friday 8:30am-5:00pm.  BELOW ARE SYMPTOMS THAT SHOULD BE REPORTED IMMEDIATELY:  *FEVER GREATER THAN 101.0 F  *CHILLS WITH OR WITHOUT FEVER  NAUSEA AND VOMITING THAT IS NOT CONTROLLED WITH YOUR NAUSEA MEDICATION  *UNUSUAL SHORTNESS OF BREATH  *UNUSUAL BRUISING OR BLEEDING  TENDERNESS IN MOUTH AND THROAT WITH OR WITHOUT PRESENCE OF ULCERS  *URINARY PROBLEMS  *BOWEL PROBLEMS  UNUSUAL RASH Items with * indicate a potential emergency and should be followed up as soon as possible. If you have an emergency after office hours please contact your primary care physician or go to the nearest emergency department.  Please call the clinic during office hours if you have any questions or concerns.   You may also contact the Patient Navigator at [phone] should you have any questions or need assistance in obtaining follow up care.      Resources For Cancer Patients and their Caregivers ? American Cancer Society: Can assist with transportation, wigs, general needs, runs Look Good Feel Better.        1-888-227-6333 ? Cancer Care: Provides financial assistance, online support groups, medication/co-pay assistance.  1-800-813-HOPE (4673) ? [facility] Assists [place] cancer  patients and their families through emotional , educational and financial support.  336-427-4357 ? [place] DSS Where to apply for food stamps, Medicaid and utility assistance. 336-342-1394 ? RCATS: Transportation to medical appointments. 336-347-2287 ? Social Security Administration: May apply for disability if have a Stage IV cancer. 336-342-7796 1-800-772-1213 ? [place] A[facility], Disability and Transit Services: Assists with nutrition, care and transit needs. 336-349-2343         

## 2017-03-11 ENCOUNTER — Other Ambulatory Visit (HOSPITAL_COMMUNITY): Payer: Self-pay | Admitting: Pharmacist

## 2017-03-12 ENCOUNTER — Encounter (HOSPITAL_BASED_OUTPATIENT_CLINIC_OR_DEPARTMENT_OTHER): Payer: Medicare Other

## 2017-03-12 VITALS — BP 130/77 | HR 98 | Temp 98.5°F | Resp 18

## 2017-03-12 DIAGNOSIS — Z452 Encounter for adjustment and management of vascular access device: Secondary | ICD-10-CM

## 2017-03-12 DIAGNOSIS — C182 Malignant neoplasm of ascending colon: Secondary | ICD-10-CM | POA: Diagnosis not present

## 2017-03-12 MED ORDER — HEPARIN SOD (PORK) LOCK FLUSH 100 UNIT/ML IV SOLN
500.0000 [IU] | Freq: Once | INTRAVENOUS | Status: AC | PRN
  Administered 2017-03-12: 500 [IU]

## 2017-03-12 MED ORDER — HEPARIN SOD (PORK) LOCK FLUSH 100 UNIT/ML IV SOLN
INTRAVENOUS | Status: AC
  Filled 2017-03-12: qty 5

## 2017-03-12 MED ORDER — SODIUM CHLORIDE 0.9% FLUSH
10.0000 mL | INTRAVENOUS | Status: DC | PRN
  Administered 2017-03-12: 10 mL
  Filled 2017-03-12: qty 10

## 2017-03-12 NOTE — Progress Notes (Signed)
Denies any complaints with chemo. Continuous infusion pump stopped/completed on arrival. Disconnected infusion pump. Flushed port per protocol.

## 2017-03-12 NOTE — Patient Instructions (Signed)
[place] at [facility] Discharge Instructions  RECOMMENDATIONS MADE BY THE CONSULTANT AND ANY TEST RESULTS WILL BE SENT TO YOUR REFERRING PHYSICIAN.  Continuous infusion pump disconnected and port flushed.  Thank you for choosing [place] at [facility] to provide your oncology and hematology care.  To afford each patient quality time with our provider, please arrive at least 15 minutes before your scheduled appointment time.    If you have a lab appointment with the [place] please come in thru the  Main Entrance and check in at the main information desk  You need to re-schedule your appointment should you arrive 10 or more minutes late.  We strive to give you quality time with our providers, and arriving late affects you and other patients whose appointments are after yours.  Also, if you no show three or more times for appointments you may be dismissed from the clinic at the providers discretion.     Again, thank you for choosing [facility].  Our hope is that these requests will decrease the amount of time that you wait before being seen by our physicians.       _____________________________________________________________  Should you have questions after your visit to [facility], please contact our office at [phone] between the hours of 8:30 a.m. and 4:30 p.m.  Voicemails left after 4:30 p.m. will not be returned until the following business day.  For prescription refill requests, have your pharmacy contact our office.       Resources For Cancer Patients and their Caregivers ? American Cancer Society: Can assist with transportation, wigs, general needs, runs Look Good Feel Better.        ([phone] ? Cancer Care: Provides financial assistance, online support groups, medication/co-pay assistance.  1-800-813-HOPE 978-462-6080) ? [facility] Assists [place] cancer  patients and their families through emotional , educational and financial support.  813 179 5126 ? [place] DSS Where to apply for food stamps, Medicaid and utility assistance. (401)861-8765 ? RCATS: Transportation to medical appointments. 678-607-1000 ? Social Security Administration: May apply for disability if have a Stage IV cancer. 204-417-1439 818-228-0883 ? [employer], Disability and Transit Services: Assists with nutrition, care and transit needs. [place] Support Programs: @10RELATIVEDAYS @ > Cancer Support Group  2nd Tuesday of the month 1pm-2pm, Journey Room  > Creative Journey  3rd Tuesday of the month 1130am-1pm, Journey Room  > Look Good Feel Better  1st Wednesday of the month 10am-12 noon, Journey Room (Call [place] to register [phone])

## 2017-03-18 ENCOUNTER — Encounter (HOSPITAL_COMMUNITY): Payer: Self-pay

## 2017-03-24 ENCOUNTER — Encounter (HOSPITAL_BASED_OUTPATIENT_CLINIC_OR_DEPARTMENT_OTHER): Payer: Medicare Other | Admitting: Oncology

## 2017-03-24 ENCOUNTER — Encounter (HOSPITAL_BASED_OUTPATIENT_CLINIC_OR_DEPARTMENT_OTHER): Payer: Medicare Other

## 2017-03-24 ENCOUNTER — Encounter (HOSPITAL_COMMUNITY): Payer: Self-pay

## 2017-03-24 VITALS — BP 120/82 | HR 95 | Temp 98.3°F | Resp 18 | Wt 189.4 lb

## 2017-03-24 DIAGNOSIS — Z5112 Encounter for antineoplastic immunotherapy: Secondary | ICD-10-CM | POA: Diagnosis present

## 2017-03-24 DIAGNOSIS — Z86711 Personal history of pulmonary embolism: Secondary | ICD-10-CM | POA: Diagnosis not present

## 2017-03-24 DIAGNOSIS — Z5111 Encounter for antineoplastic chemotherapy: Secondary | ICD-10-CM | POA: Diagnosis not present

## 2017-03-24 DIAGNOSIS — C182 Malignant neoplasm of ascending colon: Secondary | ICD-10-CM

## 2017-03-24 DIAGNOSIS — D500 Iron deficiency anemia secondary to blood loss (chronic): Secondary | ICD-10-CM | POA: Diagnosis not present

## 2017-03-24 DIAGNOSIS — C787 Secondary malignant neoplasm of liver and intrahepatic bile duct: Secondary | ICD-10-CM

## 2017-03-24 DIAGNOSIS — Z7901 Long term (current) use of anticoagulants: Secondary | ICD-10-CM

## 2017-03-24 LAB — COMPREHENSIVE METABOLIC PANEL
ALK PHOS: 53 U/L (ref 38–126)
ALT: 32 U/L (ref 14–54)
ANION GAP: 9 (ref 5–15)
AST: 42 U/L — ABNORMAL HIGH (ref 15–41)
Albumin: 3.8 g/dL (ref 3.5–5.0)
BILIRUBIN TOTAL: 0.7 mg/dL (ref 0.3–1.2)
BUN: 10 mg/dL (ref 6–20)
CO2: 26 mmol/L (ref 22–32)
CREATININE: 0.69 mg/dL (ref 0.44–1.00)
Calcium: 8.6 mg/dL — ABNORMAL LOW (ref 8.9–10.3)
Chloride: 102 mmol/L (ref 101–111)
GFR calc non Af Amer: >60 mL/min (ref >60)
Glucose, Bld: 148 mg/dL — ABNORMAL HIGH (ref 65–99)
Potassium: 3.9 mmol/L (ref 3.5–5.1)
SODIUM: 137 mmol/L (ref 135–145)
Total Protein: 6.8 g/dL (ref 6.5–8.1)

## 2017-03-24 LAB — URINALYSIS, DIPSTICK ONLY
BILIRUBIN URINE: NEGATIVE
Glucose, UA: NEGATIVE mg/dL
Hgb urine dipstick: NEGATIVE
LEUKOCYTES UA: NEGATIVE
NITRITE: NEGATIVE
Protein, ur: NEGATIVE mg/dL
Specific Gravity, Urine: 1.025 (ref 1.005–1.030)
pH: 6 (ref 5.0–8.0)

## 2017-03-24 LAB — CBC WITH DIFFERENTIAL/PLATELET
Basophils Absolute: 0 10*3/uL (ref 0.0–0.1)
Basophils Relative: 1 %
EOS ABS: 0.2 10*3/uL (ref 0.0–0.7)
Eosinophils Relative: 3 %
HCT: 37.5 % (ref 36.0–46.0)
HEMOGLOBIN: 12.2 g/dL (ref 12.0–15.0)
LYMPHS ABS: 2.1 10*3/uL (ref 0.7–4.0)
LYMPHS PCT: 34 %
MCH: 30.7 pg (ref 26.0–34.0)
MCHC: 32.5 g/dL (ref 30.0–36.0)
MCV: 94.5 fL (ref 78.0–100.0)
MONOS PCT: 17 %
Monocytes Absolute: 1.1 10*3/uL — ABNORMAL HIGH (ref 0.1–1.0)
NEUTROS PCT: 45 %
Neutro Abs: 2.8 10*3/uL (ref 1.7–7.7)
Platelets: 159 10*3/uL (ref 150–400)
RBC: 3.97 MIL/uL (ref 3.87–5.11)
RDW: 19.1 % — ABNORMAL HIGH (ref 11.5–15.5)
WBC: 6.1 10*3/uL (ref 4.0–10.5)

## 2017-03-24 MED ORDER — DEXTROSE 5 % IV SOLN
Freq: Once | INTRAVENOUS | Status: AC
  Administered 2017-03-24: 12:00:00 via INTRAVENOUS

## 2017-03-24 MED ORDER — OXALIPLATIN CHEMO INJECTION 100 MG/20ML
85.0000 mg/m2 | Freq: Once | INTRAVENOUS | Status: AC
  Administered 2017-03-24: 170 mg via INTRAVENOUS
  Filled 2017-03-24: qty 34

## 2017-03-24 MED ORDER — LEUCOVORIN CALCIUM INJECTION 350 MG
404.0000 mg/m2 | Freq: Once | INTRAVENOUS | Status: AC
  Administered 2017-03-24: 800 mg via INTRAVENOUS
  Filled 2017-03-24: qty 35

## 2017-03-24 MED ORDER — SODIUM CHLORIDE 0.9 % IV SOLN
INTRAVENOUS | Status: DC
Start: 2017-03-24 — End: 2017-03-24
  Administered 2017-03-24: 11:00:00 via INTRAVENOUS

## 2017-03-24 MED ORDER — SODIUM CHLORIDE 0.9 % IV SOLN
2400.0000 mg/m2 | INTRAVENOUS | Status: DC
  Administered 2017-03-24: 4750 mg via INTRAVENOUS
  Filled 2017-03-24: qty 95

## 2017-03-24 MED ORDER — SODIUM CHLORIDE 0.9 % IV SOLN
5.0000 mg/kg | Freq: Once | INTRAVENOUS | Status: AC
  Administered 2017-03-24: 450 mg via INTRAVENOUS
  Filled 2017-03-24: qty 2

## 2017-03-24 MED ORDER — FLUOROURACIL CHEMO INJECTION 2.5 GM/50ML
400.0000 mg/m2 | Freq: Once | INTRAVENOUS | Status: AC
  Administered 2017-03-24: 800 mg via INTRAVENOUS
  Filled 2017-03-24: qty 16

## 2017-03-24 MED ORDER — DEXAMETHASONE SODIUM PHOSPHATE 10 MG/ML IJ SOLN
10.0000 mg | Freq: Once | INTRAMUSCULAR | Status: AC
  Administered 2017-03-24: 10 mg via INTRAVENOUS

## 2017-03-24 MED ORDER — PALONOSETRON HCL INJECTION 0.25 MG/5ML
0.2500 mg | Freq: Once | INTRAVENOUS | Status: AC
  Administered 2017-03-24: 0.25 mg via INTRAVENOUS

## 2017-03-24 NOTE — Progress Notes (Signed)
Tolerated chemo well. Continuous infusion pump intact. Stable and ambulatory on discharge home with daughter.

## 2017-03-24 NOTE — Patient Instructions (Signed)
[facility] Discharge Instructions for Patients Receiving Chemotherapy   Beginning January 23rd 2017 lab work for the [facility] will be done in the  Main lab at [facility] on 1st floor. If you have a lab appointment with the [facility] please come in thru the  Main Entrance and check in at the main information desk   Today you received the following chemotherapy agents avastin, oxaliplatin, leucovorin and 64fu. To help prevent nausea and vomiting after your treatment, we encourage you to take your nausea medication as instructed.   If you develop nausea and vomiting, or diarrhea that is not controlled by your medication, call the clinic.  The clinic phone number is [phone]. Office hours are Monday-Friday 8:30am-5:00pm.  BELOW ARE SYMPTOMS THAT SHOULD BE REPORTED IMMEDIATELY:  *FEVER GREATER THAN 101.0 F  *CHILLS WITH OR WITHOUT FEVER  NAUSEA AND VOMITING THAT IS NOT CONTROLLED WITH YOUR NAUSEA MEDICATION  *UNUSUAL SHORTNESS OF BREATH  *UNUSUAL BRUISING OR BLEEDING  TENDERNESS IN MOUTH AND THROAT WITH OR WITHOUT PRESENCE OF ULCERS  *URINARY PROBLEMS  *BOWEL PROBLEMS  UNUSUAL RASH Items with * indicate a potential emergency and should be followed up as soon as possible. If you have an emergency after office hours please contact your primary care physician or go to the nearest emergency department.  Please call the clinic during office hours if you have any questions or concerns.   You may also contact the Patient Navigator at [phone] should you have any questions or need assistance in obtaining follow up care.      Resources For Cancer Patients and their Caregivers ? American Cancer Society: Can assist with transportation, wigs, general needs, runs Look Good Feel Better.        315-211-6851 ? Cancer Care: Provides financial assistance, online support groups, medication/co-pay assistance.  1-800-813-HOPE 228-110-5101) ? [facility] Assists [place] cancer patients and their families through emotional , educational and financial support.  862-886-0593 ? [place] DSS Where to apply for food stamps, Medicaid and utility assistance. (217) 363-6368 ? RCATS: Transportation to medical appointments. 579-059-7127 ? Social Security Administration: May apply for disability if have a Stage IV cancer. 612-290-7337 [phone] ? [employer], Disability and Transit Services: Assists with nutrition, care and transit needs. 3324618186

## 2017-03-24 NOTE — Progress Notes (Signed)
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 There is a small adjacent foci of gas and free fluid raising the possibility of micro perforation. There is marked dilatation of the cecum and small bowel with fluid and gas most compatible with proximal obstruction caused by the large ascending colonic mass. Additionally there is a 6 cm fatty mass involving the colon at the level the hepatic flexure, most suggestive of a lipoma. Aortic vascular calcifications. Sigmoid colonic diverticulosis without evidence for acute diverticulitis.No specific findings identified to suggest metastatic disease to the chest.There is a 3 mm nodule identified within the left upper lobe, nonspecific.Bilateral pleural effusions with overlying compressive type atelectasis and consolidation       06/26/2016 Tumor Marker    CEA 39.3 ng/ml      06/27/2016 Surgery    Partial colectomy with primary linear stapled anastomosis.  Large bulky mid-ascending colonic mass extending into the colonic mesentery and retroperitoneum with a layer of not visibly/palpably involved fat between the colon and underlying visualized and exposed duodenum and Right kidney      06/27/2016 Pathology Results    COLONIC ADENOCARCINOMA (8.5 CM), GRADE 2 THE TUMOR INVADES THROUGH THE MUSCULARIS PROPRIA INTO PERICOLONIC TISSUE  ALL MARGINS OF RESECTION ARE NEGATIVE FOR CARCINOMA FOURTEEN BENIGN LYMPH NODES (0/14) TUBULAR ADENOMA (X3) UNREMARKABLE APPENDIX      06/30/2016 Imaging    CT angio chest Positive examination for right  upper lobe and right lower lobe pulmonary emboli. No evidence of right heart strain. Again demonstrated are bilateral pleural effusions and basilar atelectasis, greater on the right.      07/30/2016 Tumor Marker      Ref. Range 07/30/2016 16:46  CEA Latest Ref Range: 0.0 - 4.7 ng/mL 4.0        07/30/2016 Miscellaneous    Medical Oncology consultation.  No adjuvant chemotherapy needed based upon risk factors and small benefit.  Informed decision made by patient to not pursue systemic chemotherapy in adjuvant setting.      11/05/2016 Procedure    Colonoscopy by Dr. Laural Golden.  soft anal tags found on digital rectal exam. - Patent end-to-side colo-colonic anastomosis, characterized by healthy appearing mucosa. - Three 5 to 7 mm polyps in the proximal transverse colon, removed with a cold snare. Resected and retrieved. - One 10 mm polyp in the proximal transverse colon, removed with a hot snare. Resected and retrieved. - One 20 mm polyp in the proximal sigmoid colon, removed with a hot snare. Resected and retrieved. Clip (MR conditional) was placed. - One 15 to 25 mm polyp at the recto-sigmoid colon, removed with a hot snare. Resected and retrieved. Clip (MR conditional) was placed. - Diverticulosis in the sigmoid colon and in the descending colon. - The distal rectum and anal verge are normal on retroflexion view.      11/07/2016 Pathology Results    1. Colon, polyp(s), transverse proximal - TUBULAR ADENOMA(S). - HIGH GRADE DYSPLASIA IS NOT IDENTIFIED. 2. Colon, polyp(s), proximal sigmoid - TUBULOVILLOUS ADENOMA WITH FOCAL HIGH GRADE DYSPLASIA (5%). 3. Rectosigmoid , polyp - TUBULOVILLOUS  ADENOMA WITH HIGH GRADE DYSPLASIA (15%).      12/26/2016 Imaging    CT abd/pelvis-1. Interval development of a 2.4 cm rim enhancing lesion in the lateral segment left liver, highly suspicious for metastatic disease. 2. New abnormal irregular soft tissue in the cul-de-sac, involving the anterior wall  of the rectum and posterior cervix. This is highly suspicious for metastatic involvement (drop lesion). 3. Interval Right hemicolectomy. 4.  Abdominal Aortic Atherosclerois (ICD10-170.0)      01/16/2017 Procedure    IR biopsy of hepatic lesion      01/19/2017 Pathology Results    Liver, needle/core biopsy, Left Lobe - ADENOCARCINOMA Microscopic Comment The morphology is consistent with metastatic colorectal adenocarcinoma. There is sufficient tissue for additional studies. Called to [facility] on 01/19/17      02/06/2017 Imaging    CT chest- 1. Stable CT chest. No substantial change in the 7 mm short axis left paraesophageal lymph node. 2. Rim enhancing lesion lateral segment left liver has progressed in the interval      02/09/2017 Procedure    Port-A-Cath insertion by Dr. Arnoldo Morale      02/10/2017 -  Chemotherapy    The patient had palonosetron (ALOXI) injection 0.25 mg, 0.25 mg, Intravenous,  Once, 1 of 4 cycles  leucovorin 800 mg in dextrose 5 % 250 mL infusion, 404 mg/m2 = 792 mg, Intravenous,  Once, 1 of 4 cycles  oxaliplatin (ELOXATIN) 170 mg in dextrose 5 % 500 mL chemo infusion, 85 mg/m2 = 170 mg, Intravenous,  Once, 1 of 4 cycles  fluorouracil (ADRUCIL) chemo injection 800 mg, 400 mg/m2 = 800 mg, Intravenous,  Once, 1 of 4 cycles  fluorouracil (ADRUCIL) 4,750 mg in sodium chloride 0.9 % 55 mL chemo infusion, 2,400 mg/m2 = 4,750 mg, Intravenous, 1 Day/Dose, 1 of 4 cycles  for chemotherapy treatment.        03/11/2017 Pathology Results    FoundationONE- KRAS wildtype, NRAS wildtype, MS stable, TMB 6 Muts/Mb, APC R945*, APC O592*, TERT promoter-124C>T, TP53 LOSS.  Therapies with clinical benefit include cetuximab, Panitumumab.      HISTORY OF PRESENTING ILLNESS:  Janet Hanson 71 y.o. female is here for follow up of stage IV, T3 N0 M1 ascending colonic adenocarcinoma with liver metastasis.  Avastin was added to FOLFOX starting with cycle 3 of chemo due to evidence  of progression due to enlarging liver met.   She is doing well today. She is going to receive cycle 4 of FOLFOX +avastin today. She has been tolerating chemotherapy well without major side effects. She states she does get nauseated a few days after chemotherapy but she takes her antiemetics as needed. She states she is been eating well and denies any decrease in appetite. Denies chest pain, shortness of breath, abdominal pain, or any other concerns.   MEDICAL HISTORY:  Past Medical History:  Diagnosis Date  . Cancer of right colon ([place]) 07/30/2016  . Colon cancer ([place])   . Depression   . Hypercholesterolemia   . Hypertension   . Iron deficiency anemia due to chronic blood loss 07/30/2016  . Vitamin B12 deficiency 08/13/2016    SURGICAL HISTORY: Past Surgical History:  Procedure Laterality Date  . COLONOSCOPY N/A 11/05/2016   Procedure: COLONOSCOPY;  Surgeon: Rogene Houston, MD;  Location: [facility] ENDO SUITE;  Service: Endoscopy;  Laterality: N/A;  1:20  . PARTIAL COLECTOMY N/A 06/27/2016   Procedure: PARTIAL COLECTOMY;  Surgeon: Vickie Epley, MD;  Location: [facility] ORS;  Service: General;  Laterality: N/A;  . PORTACATH PLACEMENT Right 02/09/2017   Procedure: INSERTION PORT-A-CATH;  Surgeon: Aviva Signs, MD;  Location: [facility] ORS;  Service: General;  Laterality: Right;  right subclavian    SOCIAL HISTORY: Social History   Social History  . Marital status: Widowed    Spouse name: N/A  . Number of children: N/A  . Years of education: N/A   Occupational History  . Not on file.   Social History Main Topics  . Smoking status: Former Smoker    Packs/day: 0.50    Years: 48.00    Types: Cigarettes    Quit date: 06/24/2016  . Smokeless tobacco: Never Used     Comment: quit smoking Sept 2017  . Alcohol use No     Comment: glass wine  twice per year  . Drug use: No  . Sexual activity: No     Comment: widowed- 1 daughter   Other Topics Concern  . Not on file   Social History Narrative  .  No narrative on file   Widowed for 6 years 1 child 1 grandchild Ex smoker, quit June 24, 2016. Smoked since 71 years old so about 40 years total She is retired. Worked in [place] and [place]. 0 pets  FAMILY HISTORY: Family History  Problem Relation Age of Onset  . Hypertension Mother   . Other Father        some type of "stomach ailment"  . Suicidality Brother   . Stomach cancer Maternal Aunt   . Brain cancer Brother    Mother deceased at 76 yo. 2 years ago. She had high blood pressure. She fell and broke her hip. Didn't have any disease "as far as I know" Father deceased in his 75s. She is not sure what of. He ate Ex Lax all the time for constipation. 2 brothers. The patient is the middle child. Older brother was 21 years-old and committed suicide Youngest brother had brain cancer and died last year  ALLERGIES:  has No Known Allergies.  MEDICATIONS:  Current Outpatient Prescriptions  Medication Sig Dispense Refill  . acetaminophen (TYLENOL) 325 MG tablet Take 325 mg by mouth every 6 (six) hours as needed for mild pain or moderate pain.    Marland Kitchen amLODipine (NORVASC) 5 MG tablet Take 5 mg by mouth daily.    . Ascorbic Acid (VITAMIN C) 1000 MG tablet Take 1,000 mg by mouth daily.    . Cyanocobalamin (B-12) 5000 MCG SUBL Place 1 tablet under the tongue daily. 30 tablet 11  . dexamethasone (DECADRON) 4 MG tablet Take 2 tablets (8 mg total) by mouth daily. Start the day after chemotherapy for 2 days. Take with food. 30 tablet 1  . escitalopram (LEXAPRO) 10 MG tablet Take 10 mg by mouth daily.    . fluorouracil CALGB 35329 in sodium chloride 0.9 % 150 mL Inject into the vein over 48 hr.    . HYDROcodone-acetaminophen (NORCO) 5-325 MG tablet Take 1 tablet by mouth every 6 (six) hours as needed for moderate pain. 30 tablet 0  . iron polysaccharides (NIFEREX) 150 MG capsule Take 1 capsule (150 mg total) by mouth daily. 30 capsule 6  . LEUCOVORIN CALCIUM IV Inject into the vein.  Every 2 weeks    . lidocaine-prilocaine (EMLA) cream Apply to affected area once 30 g 3  . Omega-3 Fatty Acids (FISH OIL) 1200 MG CAPS Take 1,200 capsules by mouth daily.     . ondansetron (ZOFRAN) 8 MG tablet Take  1 tablet (8 mg total) by mouth 2 (two) times daily as needed for refractory nausea / vomiting. Start on day 3 after chemotherapy. 30 tablet 1  . OXALIPLATIN IV Inject into the vein. Every 2 weeks    . prochlorperazine (COMPAZINE) 10 MG tablet Take 1 tablet (10 mg total) by mouth every 6 (six) hours as needed (Nausea or vomiting). 30 tablet 1  . simvastatin (ZOCOR) 40 MG tablet Take 40 mg by mouth daily.    Marland Kitchen warfarin (COUMADIN) 5 MG tablet Take 2.5-5 mg by mouth daily. Takes 57m daily except 2.553mon Mondays and Thursdays.     No current facility-administered medications for this visit.    Facility-Administered Medications Ordered in Other Visits  Medication Dose Route Frequency Provider Last Rate Last Dose  . 0.9 %  sodium chloride infusion   Intravenous Continuous ZhTwana FirstMD   Stopped at 03/24/17 1135  . fluorouracil (ADRUCIL) 4,750 mg in sodium chloride 0.9 % 55 mL chemo infusion  2,400 mg/m2 (Treatment Plan Recorded) Intravenous 1 day or 1 dose ZhTwana FirstMD      . fluorouracil (ADRUCIL) chemo injection 800 mg  400 mg/m2 (Treatment Plan Recorded) Intravenous Once ZhTwana FirstMD      . leucovorin 800 mg in dextrose 5 % 250 mL infusion  404 mg/m2 (Treatment Plan Recorded) Intravenous Once ZhTwana FirstMD 145 mL/hr at 03/24/17 1136 800 mg at 03/24/17 1136  . oxaliplatin (ELOXATIN) 170 mg in dextrose 5 % 500 mL chemo infusion  85 mg/m2 (Treatment Plan Recorded) Intravenous Once ZhTwana FirstMD 267 mL/hr at 03/24/17 1135 170 mg at 03/24/17 1135   Review of Systems  Constitutional: Negative.   HENT: Negative.   Eyes: Negative.   Respiratory: Negative.   Cardiovascular: Negative.   Gastrointestinal: Negative.  Negative for abdominal pain.  Genitourinary: Negative.     Musculoskeletal:       Sore at port site  Skin: Negative.   Neurological: Negative.   Endo/Heme/Allergies: Negative.   Psychiatric/Behavioral: The patient is not nervous/anxious.   All other systems reviewed and are negative. 14 point ROS was done and is otherwise as detailed above or in HPI  PHYSICAL EXAMINATION: ECOG PERFORMANCE STATUS: 0 - Asymptomatic  Physical Exam  Constitutional: She is oriented to person, place, and time and well-developed, well-nourished, and in no distress.  HENT:  Head: Normocephalic and atraumatic.  Nose: Nose normal.  Mouth/Throat: Oropharynx is clear and moist. No oropharyngeal exudate.  Eyes: Conjunctivae and EOM are normal. Pupils are equal, round, and reactive to light. Right eye exhibits no discharge. Left eye exhibits no discharge. No scleral icterus.  Neck: Normal range of motion. Neck supple. No tracheal deviation present. No thyromegaly present.  Cardiovascular: Normal rate, regular rhythm and normal heart sounds.  Exam reveals no gallop and no friction rub.   No murmur heard. Pulmonary/Chest: Effort normal and breath sounds normal. She has no wheezes. She has no rales.  Right chest wall port C/D/I   Abdominal: Soft. Bowel sounds are normal. She exhibits no distension and no mass. There is no tenderness. There is no rebound and no guarding.  Musculoskeletal: Normal range of motion. She exhibits no edema.  Lymphadenopathy:    She has no cervical adenopathy.  Neurological: She is alert and oriented to person, place, and time. She has normal reflexes. No cranial nerve deficit. Gait normal. Coordination normal.  Skin: Skin is warm and dry. No rash noted.  Psychiatric: Mood, memory, affect and judgment normal.  Nursing note and vitals reviewed.  LABORATORY DATA:  I have reviewed the data as listed Lab Results  Component Value Date   WBC 6.1 03/24/2017   HGB 12.2 03/24/2017   HCT 37.5 03/24/2017   MCV 94.5 03/24/2017   PLT 159 03/24/2017    CMP     Component Value Date/Time   NA 137 03/24/2017 0925   K 3.9 03/24/2017 0925   CL 102 03/24/2017 0925   CO2 26 03/24/2017 0925   GLUCOSE 148 (H) 03/24/2017 0925   BUN 10 03/24/2017 0925   CREATININE 0.69 03/24/2017 0925   CALCIUM 8.6 (L) 03/24/2017 0925   PROT 6.8 03/24/2017 0925   ALBUMIN 3.8 03/24/2017 0925   AST 42 (H) 03/24/2017 0925   ALT 32 03/24/2017 0925   ALKPHOS 53 03/24/2017 0925   BILITOT 0.7 03/24/2017 0925   GFRNONAA >60 03/24/2017 0925   GFRAA >60 03/24/2017 0925   RADIOGRAPHIC STUDIES: I have personally reviewed the radiological images as listed and agreed with the findings in the report.  CT Chest w Contrast 02/06/2017 IMPRESSION: 1. Stable CT chest. No substantial change in the 7 mm short axis left paraesophageal lymph node. 2. Rim enhancing lesion lateral segment left liver has progressed in the interval  CT Abdomen Pelvis w Contrast 12/26/2016 IMPRESSION: 1. Interval development of a 2.4 cm rim enhancing lesion in the lateral segment left liver, highly suspicious for metastatic disease. 2. New abnormal irregular soft tissue in the cul-de-sac, involving the anterior wall of the rectum and posterior cervix. This is highly suspicious for metastatic involvement (drop lesion). 3. Interval Right hemicolectomy. 4.  Abdominal Aortic Atherosclerois (ICD10-170.0)  PATHOLOGY   ASSESSMENT & PLAN:  Initial 06/2016 with Stage IIA (T3N0M0) adenocarcinoma of colon 06/2016 but now Stage IV, T3 N0 M1 ascending colonic adenocarcinoma with liver met and drop lesion to cul de sac Bilateral pulmonary emboli Chronic anticoagulation Iron deficiency anemia secondary to chronic GI related blood loss Lack of routine screening   Continue with cycle 4 of palliative FOLFOX+ avastin today. Labs reviewed.   After 6 cycles of FOLFOX, assess for resectabilty of oligometastatic disease with restaging PET-CT.  She will return for follow up in 4 weeks.     All  questions were answered. The patient knows to call the clinic with any problems, questions or concerns.  This document serves as a record of services personally performed by Twana First, MD. It was created on her behalf by Martinique Casey, a trained medical scribe. The creation of this record is based on the scribe's personal observations and the provider's statements to them. This document has been checked and approved by the attending provider.  I have reviewed the above documentation for accuracy and completeness and I agree with the above.  This note was electronically signed.    Twana First, MD  03/24/2017 1:01 PM

## 2017-03-24 NOTE — Patient Instructions (Signed)
[facility] at [facility] Discharge Instructions  RECOMMENDATIONS MADE BY THE CONSULTANT AND ANY TEST RESULTS WILL BE SENT TO YOUR REFERRING PHYSICIAN.  You were seen today by Dr. Louise Zhou    Thank you for choosing [facility] at [facility] to provide your oncology and hematology care.  To afford each patient quality time with our provider, please arrive at least 15 minutes before your scheduled appointment time.    If you have a lab appointment with the Cancer Center please come in thru the  Main Entrance and check in at the main information desk  You need to re-schedule your appointment should you arrive 10 or more minutes late.  We strive to give you quality time with our providers, and arriving late affects you and other patients whose appointments are after yours.  Also, if you no show three or more times for appointments you may be dismissed from the clinic at the providers discretion.     Again, thank you for choosing [facility].  Our hope is that these requests will decrease the amount of time that you wait before being seen by our physicians.       _____________________________________________________________  Should you have questions after your visit to [facility], please contact our office at [phone] between the hours of 8:30 a.m. and 4:30 p.m.  Voicemails left after 4:30 p.m. will not be returned until the following business day.  For prescription refill requests, have your pharmacy contact our office.       Resources For Cancer Patients and their Caregivers ? American Cancer Society: Can assist with transportation, wigs, general needs, runs Look Good Feel Better.        1-888-227-6333 ? Cancer Care: Provides financial assistance, online support groups, medication/co-pay assistance.  1-800-813-HOPE (4673) ? [facility] Assists [place] cancer patients and their  families through emotional , educational and financial support.  336-427-4357 ? [place] DSS Where to apply for food stamps, Medicaid and utility assistance. 336-342-1394 ? RCATS: Transportation to medical appointments. 336-347-2287 ? Social Security Administration: May apply for disability if have a Stage IV cancer. 336-342-7796 1-800-772-1213 ? [place] Aging, Disability and Transit Services: Assists with nutrition, care and transit needs. 336-349-2343  Cancer Center Support Programs: @10RELATIVEDAYS@ > Cancer Support Group  2nd Tuesday of the month 1pm-2pm, Journey Room  > Creative Journey  3rd Tuesday of the month 1130am-1pm, Journey Room  > Look Good Feel Better  1st Wednesday of the month 10am-12 noon, Journey Room (Call [employer] to register 1-800-395-5775)    

## 2017-03-25 LAB — CEA: CEA: 21 ng/mL — ABNORMAL HIGH (ref 0.0–4.7)

## 2017-03-26 ENCOUNTER — Encounter (HOSPITAL_BASED_OUTPATIENT_CLINIC_OR_DEPARTMENT_OTHER): Payer: Medicare Other

## 2017-03-26 VITALS — BP 136/92 | HR 99 | Temp 99.1°F | Resp 20

## 2017-03-26 DIAGNOSIS — C182 Malignant neoplasm of ascending colon: Secondary | ICD-10-CM | POA: Diagnosis present

## 2017-03-26 MED ORDER — SODIUM CHLORIDE 0.9% FLUSH
10.0000 mL | INTRAVENOUS | Status: DC | PRN
  Administered 2017-03-26: 10 mL
  Filled 2017-03-26: qty 10

## 2017-03-26 MED ORDER — HEPARIN SOD (PORK) LOCK FLUSH 100 UNIT/ML IV SOLN
500.0000 [IU] | Freq: Once | INTRAVENOUS | Status: AC | PRN
  Administered 2017-03-26: 500 [IU]

## 2017-03-26 NOTE — Progress Notes (Signed)
Continuous infusion pump stopped/completed on patients arrival to clinic. Disconnected pump and flushed port per protocol. Removed needle. Patient denies complaints at present post chemo. Stable and ambulatory on discharge home to self.

## 2017-03-26 NOTE — Patient Instructions (Signed)
[place] at [facility] Discharge Instructions  RECOMMENDATIONS MADE BY THE CONSULTANT AND ANY TEST RESULTS WILL BE SENT TO YOUR REFERRING PHYSICIAN.  Disconnected infusion pump and flushed port per protocol.  Thank you for choosing [place] at [facility] to provide your oncology and hematology care.  To afford each patient quality time with our provider, please arrive at least 15 minutes before your scheduled appointment time.    If you have a lab appointment with the [facility] please come in thru the  Main Entrance and check in at the main information desk  You need to re-schedule your appointment should you arrive 10 or more minutes late.  We strive to give you quality time with our providers, and arriving late affects you and other patients whose appointments are after yours.  Also, if you no show three or more times for appointments you may be dismissed from the clinic at the providers discretion.     Again, thank you for choosing [facility].  Our hope is that these requests will decrease the amount of time that you wait before being seen by our physicians.       _____________________________________________________________  Should you have questions after your visit to [facility], please contact our office at [phone] between the hours of 8:30 a.m. and 4:30 p.m.  Voicemails left after 4:30 p.m. will not be returned until the following business day.  For prescription refill requests, have your pharmacy contact our office.       Resources For Cancer Patients and their Caregivers ? American Cancer Society: Can assist with transportation, wigs, general needs, runs Look Good Feel Better.        445-355-4590 ? Cancer Care: Provides financial assistance, online support groups, medication/co-pay assistance.  1-800-813-HOPE 281 059 2694) ? [facility] Assists [place] cancer  patients and their families through emotional , educational and financial support.  631-137-9845 ? [place] DSS Where to apply for food stamps, Medicaid and utility assistance. 5316266478 ? RCATS: Transportation to medical appointments. 681-494-6034 ? Social Security Administration: May apply for disability if have a Stage IV cancer. 937-795-4430 276-853-2865 ? [employer], Disability and Transit Services: Assists with nutrition, care and transit needs. [place] Support Programs: @10RELATIVEDAYS @ > Cancer Support Group  2nd Tuesday of the month 1pm-2pm, Journey Room  > Creative Journey  3rd Tuesday of the month 1130am-1pm, Journey Room  > Look Good Feel Better  1st Wednesday of the month 10am-12 noon, Journey Room (Call [place] to register [phone])

## 2017-04-07 ENCOUNTER — Encounter (HOSPITAL_COMMUNITY): Payer: Medicare Other | Attending: Hematology & Oncology

## 2017-04-07 VITALS — BP 134/73 | HR 104 | Temp 98.3°F | Resp 18 | Wt 185.6 lb

## 2017-04-07 DIAGNOSIS — Z79899 Other long term (current) drug therapy: Secondary | ICD-10-CM | POA: Diagnosis not present

## 2017-04-07 DIAGNOSIS — Z87891 Personal history of nicotine dependence: Secondary | ICD-10-CM | POA: Insufficient documentation

## 2017-04-07 DIAGNOSIS — Z5112 Encounter for antineoplastic immunotherapy: Secondary | ICD-10-CM

## 2017-04-07 DIAGNOSIS — Z5111 Encounter for antineoplastic chemotherapy: Secondary | ICD-10-CM

## 2017-04-07 DIAGNOSIS — C182 Malignant neoplasm of ascending colon: Secondary | ICD-10-CM

## 2017-04-07 DIAGNOSIS — D500 Iron deficiency anemia secondary to blood loss (chronic): Secondary | ICD-10-CM | POA: Diagnosis present

## 2017-04-07 LAB — CBC WITH DIFFERENTIAL/PLATELET
BASOS PCT: 0 %
Basophils Absolute: 0 10*3/uL (ref 0.0–0.1)
Eosinophils Absolute: 0.1 10*3/uL (ref 0.0–0.7)
Eosinophils Relative: 2 %
HEMATOCRIT: 37.5 % (ref 36.0–46.0)
Hemoglobin: 12 g/dL (ref 12.0–15.0)
Lymphocytes Relative: 31 %
Lymphs Abs: 1.7 10*3/uL (ref 0.7–4.0)
MCH: 30.9 pg (ref 26.0–34.0)
MCHC: 32 g/dL (ref 30.0–36.0)
MCV: 96.6 fL (ref 78.0–100.0)
MONO ABS: 1 10*3/uL (ref 0.1–1.0)
MONOS PCT: 17 %
NEUTROS ABS: 2.7 10*3/uL (ref 1.7–7.7)
Neutrophils Relative %: 50 %
Platelets: 145 10*3/uL — ABNORMAL LOW (ref 150–400)
RBC: 3.88 MIL/uL (ref 3.87–5.11)
RDW: 19.9 % — AB (ref 11.5–15.5)
WBC: 5.5 10*3/uL (ref 4.0–10.5)

## 2017-04-07 LAB — COMPREHENSIVE METABOLIC PANEL
ALBUMIN: 3.6 g/dL (ref 3.5–5.0)
ALT: 25 U/L (ref 14–54)
ANION GAP: 11 (ref 5–15)
AST: 37 U/L (ref 15–41)
Alkaline Phosphatase: 48 U/L (ref 38–126)
BILIRUBIN TOTAL: 0.8 mg/dL (ref 0.3–1.2)
BUN: 9 mg/dL (ref 6–20)
CO2: 24 mmol/L (ref 22–32)
Calcium: 8.6 mg/dL — ABNORMAL LOW (ref 8.9–10.3)
Chloride: 101 mmol/L (ref 101–111)
Creatinine, Ser: 0.68 mg/dL (ref 0.44–1.00)
Glucose, Bld: 145 mg/dL — ABNORMAL HIGH (ref 65–99)
POTASSIUM: 3.8 mmol/L (ref 3.5–5.1)
Sodium: 136 mmol/L (ref 135–145)
TOTAL PROTEIN: 6.6 g/dL (ref 6.5–8.1)

## 2017-04-07 LAB — URINALYSIS, DIPSTICK ONLY
Bilirubin Urine: NEGATIVE
Glucose, UA: NEGATIVE mg/dL
Hgb urine dipstick: NEGATIVE
KETONES UR: 5 mg/dL — AB
Nitrite: NEGATIVE
PROTEIN: NEGATIVE mg/dL
Specific Gravity, Urine: 1.018 (ref 1.005–1.030)
pH: 5 (ref 5.0–8.0)

## 2017-04-07 MED ORDER — LEUCOVORIN CALCIUM INJECTION 350 MG
404.0000 mg/m2 | Freq: Once | INTRAVENOUS | Status: AC
  Administered 2017-04-07: 800 mg via INTRAVENOUS
  Filled 2017-04-07: qty 35

## 2017-04-07 MED ORDER — DEXTROSE 5 % IV SOLN
Freq: Once | INTRAVENOUS | Status: AC
  Administered 2017-04-07: 11:00:00 via INTRAVENOUS

## 2017-04-07 MED ORDER — SODIUM CHLORIDE 0.9 % IV SOLN
5.0000 mg/kg | Freq: Once | INTRAVENOUS | Status: AC
  Administered 2017-04-07: 450 mg via INTRAVENOUS
  Filled 2017-04-07: qty 2

## 2017-04-07 MED ORDER — SODIUM CHLORIDE 0.9 % IV SOLN
2400.0000 mg/m2 | INTRAVENOUS | Status: DC
  Administered 2017-04-07: 4750 mg via INTRAVENOUS
  Filled 2017-04-07: qty 95

## 2017-04-07 MED ORDER — PALONOSETRON HCL INJECTION 0.25 MG/5ML
0.2500 mg | Freq: Once | INTRAVENOUS | Status: AC
  Administered 2017-04-07: 0.25 mg via INTRAVENOUS
  Filled 2017-04-07: qty 5

## 2017-04-07 MED ORDER — FLUOROURACIL CHEMO INJECTION 2.5 GM/50ML
400.0000 mg/m2 | Freq: Once | INTRAVENOUS | Status: AC
  Administered 2017-04-07: 800 mg via INTRAVENOUS
  Filled 2017-04-07: qty 16

## 2017-04-07 MED ORDER — OXALIPLATIN CHEMO INJECTION 100 MG/20ML
85.0000 mg/m2 | Freq: Once | INTRAVENOUS | Status: AC
  Administered 2017-04-07: 170 mg via INTRAVENOUS
  Filled 2017-04-07: qty 34

## 2017-04-07 MED ORDER — DEXAMETHASONE SODIUM PHOSPHATE 10 MG/ML IJ SOLN
10.0000 mg | Freq: Once | INTRAMUSCULAR | Status: AC
  Administered 2017-04-07: 10 mg via INTRAVENOUS
  Filled 2017-04-07: qty 1

## 2017-04-07 MED ORDER — SODIUM CHLORIDE 0.9 % IV SOLN
INTRAVENOUS | Status: DC
  Administered 2017-04-07: 10:00:00 via INTRAVENOUS

## 2017-04-07 NOTE — Progress Notes (Signed)
Tolerated chemo well. Continuous infusion pump intact. Stable and ambulatory on discharge home with daughter.

## 2017-04-07 NOTE — Patient Instructions (Signed)
[facility] Discharge Instructions for Patients Receiving Chemotherapy   Beginning January 23rd 2017 lab work for the [facility] will be done in the  Main lab at [facility] on 1st floor. If you have a lab appointment with the [facility] please come in thru the  Main Entrance and check in at the main information desk   Today you received the following chemotherapy agents avastin, oxaliplatin, leucovorin and 38fu pump. Pump will finish around 1130 am on Thursday. You may return at that time for pump removal.  To help prevent nausea and vomiting after your treatment, we encourage you to take your nausea medication as instructed. If you develop nausea and vomiting, or diarrhea that is not controlled by your medication, call the clinic.  The clinic phone number is [phone]. Office hours are Monday-Friday 8:30am-5:00pm.  BELOW ARE SYMPTOMS THAT SHOULD BE REPORTED IMMEDIATELY:  *FEVER GREATER THAN 101.0 F  *CHILLS WITH OR WITHOUT FEVER  NAUSEA AND VOMITING THAT IS NOT CONTROLLED WITH YOUR NAUSEA MEDICATION  *UNUSUAL SHORTNESS OF BREATH  *UNUSUAL BRUISING OR BLEEDING  TENDERNESS IN MOUTH AND THROAT WITH OR WITHOUT PRESENCE OF ULCERS  *URINARY PROBLEMS  *BOWEL PROBLEMS  UNUSUAL RASH Items with * indicate a potential emergency and should be followed up as soon as possible. If you have an emergency after office hours please contact your primary care physician or go to the nearest emergency department.  Please call the clinic during office hours if you have any questions or concerns.   You may also contact the Patient Navigator at [phone] should you have any questions or need assistance in obtaining follow up care.      Resources For Cancer Patients and their Caregivers ? American Cancer Society: Can assist with transportation, wigs, general needs, runs Look Good Feel Better.        ([phone] ? Cancer Care: Provides financial assistance,  online support groups, medication/co-pay assistance.  1-800-813-HOPE 973-651-1590) ? [facility] Assists [place] cancer patients and their families through emotional , educational and financial support.  754-464-9286 ? [place] DSS Where to apply for food stamps, Medicaid and utility assistance. (321)857-0578 ? RCATS: Transportation to medical appointments. (715) [phone] ? Social Security Administration: May apply for disability if have a Stage IV cancer. 580-781-6056 765-303-6394 ? [employer], Disability and Transit Services: Assists with nutrition, care and transit needs. [phone]

## 2017-04-09 ENCOUNTER — Encounter (HOSPITAL_BASED_OUTPATIENT_CLINIC_OR_DEPARTMENT_OTHER): Payer: Medicare Other

## 2017-04-09 VITALS — BP 135/82 | HR 102 | Temp 98.5°F | Resp 18

## 2017-04-09 DIAGNOSIS — C182 Malignant neoplasm of ascending colon: Secondary | ICD-10-CM | POA: Diagnosis not present

## 2017-04-09 DIAGNOSIS — Z452 Encounter for adjustment and management of vascular access device: Secondary | ICD-10-CM

## 2017-04-09 MED ORDER — SODIUM CHLORIDE 0.9% FLUSH
10.0000 mL | INTRAVENOUS | Status: DC | PRN
  Administered 2017-04-09: 10 mL
  Filled 2017-04-09: qty 10

## 2017-04-09 MED ORDER — HEPARIN SOD (PORK) LOCK FLUSH 100 UNIT/ML IV SOLN
INTRAVENOUS | Status: AC
  Filled 2017-04-09: qty 5

## 2017-04-09 MED ORDER — HEPARIN SOD (PORK) LOCK FLUSH 100 UNIT/ML IV SOLN
500.0000 [IU] | Freq: Once | INTRAVENOUS | Status: AC | PRN
Start: 2017-04-09 — End: 2017-04-09
  Administered 2017-04-09: 500 [IU]

## 2017-04-09 NOTE — Patient Instructions (Signed)
[place] at [facility] Discharge Instructions  RECOMMENDATIONS MADE BY THE CONSULTANT AND ANY TEST RESULTS WILL BE SENT TO YOUR REFERRING PHYSICIAN.  We disconnected your pump today. Follow up 6/19 for next treatment, labs and doctor visit.  Thank you for choosing [place] at [facility] to provide your oncology and hematology care.  To afford each patient quality time with our provider, please arrive at least 15 minutes before your scheduled appointment time.    If you have a lab appointment with the [place] please come in thru the  Main Entrance and check in at the main information desk  You need to re-schedule your appointment should you arrive 10 or more minutes late.  We strive to give you quality time with our providers, and arriving late affects you and other patients whose appointments are after yours.  Also, if you no show three or more times for appointments you may be dismissed from the clinic at the providers discretion.     Again, thank you for choosing [facility].  Our hope is that these requests will decrease the amount of time that you wait before being seen by our physicians.       _____________________________________________________________  Should you have questions after your visit to [facility], please contact our office at [phone] between the hours of 8:30 a.m. and 4:30 p.m.  Voicemails left after 4:30 p.m. will not be returned until the following business day.  For prescription refill requests, have your pharmacy contact our office.       Resources For Cancer Patients and their Caregivers ? American Cancer Society: Can assist with transportation, wigs, general needs, runs Look Good Feel Better.        ([phone] ? Cancer Care: Provides financial assistance, online support groups, medication/co-pay assistance.  1-800-813-HOPE 405-865-1276) ? [facility] Assists [place] cancer patients and their families through emotional , educational and financial support.  904-399-6322 ? [place] DSS Where to apply for food stamps, Medicaid and utility assistance. 787-128-6870 ? RCATS: Transportation to medical appointments. 236-579-1692 ? Social Security Administration: May apply for disability if have a Stage IV cancer. 705-811-1803 215-680-0869 ? [employer], Disability and Transit Services: Assists with nutrition, care and transit needs. [place] Support Programs: @10RELATIVEDAYS @ > Cancer Support Group  2nd Tuesday of the month 1pm-2pm, Journey Room  > Creative Journey  3rd Tuesday of the month 1130am-1pm, Journey Room  > Look Good Feel Better  1st Wednesday of the month 10am-12 noon, Journey Room (Call [place] to register [phone])

## 2017-04-09 NOTE — Progress Notes (Signed)
Patient here to have infusion pump disconnected. Pump is off.  Line disconnected from patient. Port a cath [employer]. Patient tolerated treatment with no complaints. No problems with pump.  Patient discharged stable and ambulatory from clinic home to self.

## 2017-04-21 ENCOUNTER — Encounter (HOSPITAL_BASED_OUTPATIENT_CLINIC_OR_DEPARTMENT_OTHER): Payer: Medicare Other | Admitting: Oncology

## 2017-04-21 ENCOUNTER — Encounter (HOSPITAL_BASED_OUTPATIENT_CLINIC_OR_DEPARTMENT_OTHER): Payer: Medicare Other

## 2017-04-21 ENCOUNTER — Encounter (HOSPITAL_COMMUNITY): Payer: Self-pay

## 2017-04-21 VITALS — BP 132/84 | HR 105 | Temp 98.6°F | Resp 16 | Wt 180.4 lb

## 2017-04-21 DIAGNOSIS — D500 Iron deficiency anemia secondary to blood loss (chronic): Secondary | ICD-10-CM

## 2017-04-21 DIAGNOSIS — Z5111 Encounter for antineoplastic chemotherapy: Secondary | ICD-10-CM | POA: Diagnosis not present

## 2017-04-21 DIAGNOSIS — Z5112 Encounter for antineoplastic immunotherapy: Secondary | ICD-10-CM

## 2017-04-21 DIAGNOSIS — C787 Secondary malignant neoplasm of liver and intrahepatic bile duct: Secondary | ICD-10-CM

## 2017-04-21 DIAGNOSIS — Z7901 Long term (current) use of anticoagulants: Secondary | ICD-10-CM

## 2017-04-21 DIAGNOSIS — C182 Malignant neoplasm of ascending colon: Secondary | ICD-10-CM

## 2017-04-21 DIAGNOSIS — Z86711 Personal history of pulmonary embolism: Secondary | ICD-10-CM | POA: Diagnosis not present

## 2017-04-21 LAB — CBC WITH DIFFERENTIAL/PLATELET
BASOS PCT: 0 %
Basophils Absolute: 0 10*3/uL (ref 0.0–0.1)
EOS ABS: 0.1 10*3/uL (ref 0.0–0.7)
EOS PCT: 2 %
HCT: 37.1 % (ref 36.0–46.0)
Hemoglobin: 11.9 g/dL — ABNORMAL LOW (ref 12.0–15.0)
Lymphocytes Relative: 37 %
Lymphs Abs: 1.8 10*3/uL (ref 0.7–4.0)
MCH: 31.8 pg (ref 26.0–34.0)
MCHC: 32.1 g/dL (ref 30.0–36.0)
MCV: 99.2 fL (ref 78.0–100.0)
MONO ABS: 0.7 10*3/uL (ref 0.1–1.0)
MONOS PCT: 14 %
Neutro Abs: 2.2 10*3/uL (ref 1.7–7.7)
Neutrophils Relative %: 47 %
Platelets: 153 10*3/uL (ref 150–400)
RBC: 3.74 MIL/uL — ABNORMAL LOW (ref 3.87–5.11)
RDW: 21 % — AB (ref 11.5–15.5)
WBC: 4.7 10*3/uL (ref 4.0–10.5)

## 2017-04-21 LAB — COMPREHENSIVE METABOLIC PANEL
ALT: 24 U/L (ref 14–54)
ANION GAP: 10 (ref 5–15)
AST: 33 U/L (ref 15–41)
Albumin: 3.5 g/dL (ref 3.5–5.0)
Alkaline Phosphatase: 48 U/L (ref 38–126)
BILIRUBIN TOTAL: 0.9 mg/dL (ref 0.3–1.2)
BUN: 11 mg/dL (ref 6–20)
CO2: 24 mmol/L (ref 22–32)
Calcium: 8.5 mg/dL — ABNORMAL LOW (ref 8.9–10.3)
Chloride: 102 mmol/L (ref 101–111)
Creatinine, Ser: 0.77 mg/dL (ref 0.44–1.00)
GFR calc non Af Amer: >60 mL/min (ref >60)
GLUCOSE: 127 mg/dL — AB (ref 65–99)
POTASSIUM: 3.7 mmol/L (ref 3.5–5.1)
SODIUM: 136 mmol/L (ref 135–145)
TOTAL PROTEIN: 6.3 g/dL — AB (ref 6.5–8.1)

## 2017-04-21 LAB — URINALYSIS, DIPSTICK ONLY
BILIRUBIN URINE: NEGATIVE
Glucose, UA: NEGATIVE mg/dL
HGB URINE DIPSTICK: NEGATIVE
KETONES UR: NEGATIVE mg/dL
NITRITE: NEGATIVE
Protein, ur: NEGATIVE mg/dL
Specific Gravity, Urine: 1.025 (ref 1.005–1.030)
pH: 6 (ref 5.0–8.0)

## 2017-04-21 MED ORDER — PALONOSETRON HCL INJECTION 0.25 MG/5ML
0.2500 mg | Freq: Once | INTRAVENOUS | Status: AC
  Administered 2017-04-21: 0.25 mg via INTRAVENOUS
  Filled 2017-04-21: qty 5

## 2017-04-21 MED ORDER — DEXTROSE 5 % IV SOLN
Freq: Once | INTRAVENOUS | Status: AC
  Administered 2017-04-21: 12:00:00 via INTRAVENOUS

## 2017-04-21 MED ORDER — SODIUM CHLORIDE 0.9 % IV SOLN
5.0000 mg/kg | Freq: Once | INTRAVENOUS | Status: AC
  Administered 2017-04-21: 400 mg via INTRAVENOUS
  Filled 2017-04-21: qty 16

## 2017-04-21 MED ORDER — OXALIPLATIN CHEMO INJECTION 100 MG/20ML
85.0000 mg/m2 | Freq: Once | INTRAVENOUS | Status: AC
  Administered 2017-04-21: 170 mg via INTRAVENOUS
  Filled 2017-04-21: qty 34

## 2017-04-21 MED ORDER — FLUOROURACIL CHEMO INJECTION 2.5 GM/50ML
400.0000 mg/m2 | Freq: Once | INTRAVENOUS | Status: AC
  Administered 2017-04-21: 800 mg via INTRAVENOUS
  Filled 2017-04-21: qty 16

## 2017-04-21 MED ORDER — HEPARIN SOD (PORK) LOCK FLUSH 100 UNIT/ML IV SOLN
INTRAVENOUS | Status: AC
  Filled 2017-04-21: qty 5

## 2017-04-21 MED ORDER — SODIUM CHLORIDE 0.9 % IV SOLN
2400.0000 mg/m2 | INTRAVENOUS | Status: DC
  Administered 2017-04-21: 4750 mg via INTRAVENOUS
  Filled 2017-04-21: qty 95

## 2017-04-21 MED ORDER — SODIUM CHLORIDE 0.9 % IV SOLN
INTRAVENOUS | Status: DC
  Administered 2017-04-21: 11:00:00 via INTRAVENOUS

## 2017-04-21 MED ORDER — DEXAMETHASONE SODIUM PHOSPHATE 10 MG/ML IJ SOLN
10.0000 mg | Freq: Once | INTRAMUSCULAR | Status: AC
  Administered 2017-04-21: 10 mg via INTRAVENOUS
  Filled 2017-04-21: qty 1

## 2017-04-21 MED ORDER — LEUCOVORIN CALCIUM INJECTION 350 MG
404.0000 mg/m2 | Freq: Once | INTRAVENOUS | Status: AC
  Administered 2017-04-21: 800 mg via INTRAVENOUS
  Filled 2017-04-21: qty 35

## 2017-04-21 NOTE — Progress Notes (Signed)
Chemotherapy given today per orders. Patient tolerated it well , no issues. Vitals stable and discharged home from clinic ambulatory. Continuous 5 FU pump connected per protocol. Follow up as scheduled.

## 2017-04-21 NOTE — Progress Notes (Signed)
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 There is a small adjacent foci of gas and free fluid raising the possibility of micro perforation. There is marked dilatation of the cecum and small bowel with fluid and gas most compatible with proximal obstruction caused by the large ascending colonic mass. Additionally there is a 6 cm fatty mass involving the colon at the level the hepatic flexure, most suggestive of a lipoma. Aortic vascular calcifications. Sigmoid colonic diverticulosis without evidence for acute diverticulitis.No specific findings identified to suggest metastatic disease to the chest.There is a 3 mm nodule identified within the left upper lobe, nonspecific.Bilateral pleural effusions with overlying compressive type atelectasis and consolidation       06/26/2016 Tumor Marker    CEA 39.3 ng/ml      06/27/2016 Surgery    Partial colectomy with primary linear stapled anastomosis.  Large bulky mid-ascending colonic mass extending into the colonic mesentery and retroperitoneum with a layer of not visibly/palpably involved fat between the colon and underlying visualized and exposed duodenum and Right kidney      06/27/2016 Pathology Results    COLONIC ADENOCARCINOMA (8.5 CM), GRADE 2 THE TUMOR INVADES THROUGH THE MUSCULARIS PROPRIA INTO PERICOLONIC TISSUE  ALL MARGINS OF RESECTION ARE NEGATIVE FOR CARCINOMA FOURTEEN BENIGN LYMPH NODES (0/14) TUBULAR ADENOMA (X3) UNREMARKABLE APPENDIX      06/30/2016 Imaging    CT angio chest Positive examination for right  upper lobe and right lower lobe pulmonary emboli. No evidence of right heart strain. Again demonstrated are bilateral pleural effusions and basilar atelectasis, greater on the right.      07/30/2016 Tumor Marker      Ref. Range 07/30/2016 16:46  CEA Latest Ref Range: 0.0 - 4.7 ng/mL 4.0        07/30/2016 Miscellaneous    Medical Oncology consultation.  No adjuvant chemotherapy needed based upon risk factors and small benefit.  Informed decision made by patient to not pursue systemic chemotherapy in adjuvant setting.      11/05/2016 Procedure    Colonoscopy by Dr. Laural Golden.  soft anal tags found on digital rectal exam. - Patent end-to-side colo-colonic anastomosis, characterized by healthy appearing mucosa. - Three 5 to 7 mm polyps in the proximal transverse colon, removed with a cold snare. Resected and retrieved. - One 10 mm polyp in the proximal transverse colon, removed with a hot snare. Resected and retrieved. - One 20 mm polyp in the proximal sigmoid colon, removed with a hot snare. Resected and retrieved. Clip (MR conditional) was placed. - One 15 to 25 mm polyp at the recto-sigmoid colon, removed with a hot snare. Resected and retrieved. Clip (MR conditional) was placed. - Diverticulosis in the sigmoid colon and in the descending colon. - The distal rectum and anal verge are normal on retroflexion view.      11/07/2016 Pathology Results    1. Colon, polyp(s), transverse proximal - TUBULAR ADENOMA(S). - HIGH GRADE DYSPLASIA IS NOT IDENTIFIED. 2. Colon, polyp(s), proximal sigmoid - TUBULOVILLOUS ADENOMA WITH FOCAL HIGH GRADE DYSPLASIA (5%). 3. Rectosigmoid , polyp - TUBULOVILLOUS  ADENOMA WITH HIGH GRADE DYSPLASIA (15%).      12/26/2016 Imaging    CT abd/pelvis-1. Interval development of a 2.4 cm rim enhancing lesion in the lateral segment left liver, highly suspicious for metastatic disease. 2. New abnormal irregular soft tissue in the cul-de-sac, involving the anterior wall  of the rectum and posterior cervix. This is highly suspicious for metastatic involvement (drop lesion). 3. Interval Right hemicolectomy. 4.  Abdominal Aortic Atherosclerois (ICD10-170.0)      01/16/2017 Procedure    IR biopsy of hepatic lesion      01/19/2017 Pathology Results    Liver, needle/core biopsy, Left Lobe - ADENOCARCINOMA Microscopic Comment The morphology is consistent with metastatic colorectal adenocarcinoma. There is sufficient tissue for additional studies. Called to [facility] on 01/19/17      02/06/2017 Imaging    CT chest- 1. Stable CT chest. No substantial change in the 7 mm short axis left paraesophageal lymph node. 2. Rim enhancing lesion lateral segment left liver has progressed in the interval      02/09/2017 Procedure    Port-A-Cath insertion by Dr. Arnoldo Morale      02/10/2017 -  Chemotherapy    The patient had palonosetron (ALOXI) injection 0.25 mg, 0.25 mg, Intravenous,  Once, 1 of 4 cycles  leucovorin 800 mg in dextrose 5 % 250 mL infusion, 404 mg/m2 = 792 mg, Intravenous,  Once, 1 of 4 cycles  oxaliplatin (ELOXATIN) 170 mg in dextrose 5 % 500 mL chemo infusion, 85 mg/m2 = 170 mg, Intravenous,  Once, 1 of 4 cycles  fluorouracil (ADRUCIL) chemo injection 800 mg, 400 mg/m2 = 800 mg, Intravenous,  Once, 1 of 4 cycles  fluorouracil (ADRUCIL) 4,750 mg in sodium chloride 0.9 % 55 mL chemo infusion, 2,400 mg/m2 = 4,750 mg, Intravenous, 1 Day/Dose, 1 of 4 cycles  for chemotherapy treatment.        03/11/2017 Pathology Results    FoundationONE- KRAS wildtype, NRAS wildtype, MS stable, TMB 6 Muts/Mb, APC Z482*, APC L078*, TERT promoter-124C>T, TP53 LOSS.  Therapies with clinical benefit include cetuximab, Panitumumab.      HISTORY OF PRESENTING ILLNESS:  Janet Hanson 71 y.o. female is here for follow up of stage IV, T3 N0 M1 ascending colonic adenocarcinoma with liver metastasis.  Avastin was added to FOLFOX starting with cycle 3 of chemo due to evidence  of progression due to enlarging liver met.   She is doing well today. She is going to receive cycle 6 of FOLFOX +avastin today. She has been tolerating chemotherapy well. She has been having diarrhea but was not taking her imodium as prescribed and was only taking it once or twice a day. She also noticed that she is more fatigued but continues to be able to do all her ADLs. Denies chest pain, shortness of breath, abdominal pain, or any other concerns.   MEDICAL HISTORY:  Past Medical History:  Diagnosis Date  . Cancer of right colon ([place]) 07/30/2016  . Colon cancer ([place])   . Depression   . Hypercholesterolemia   . Hypertension   . Iron deficiency anemia due to chronic blood loss 07/30/2016  . Vitamin B12 deficiency 08/13/2016    SURGICAL HISTORY: Past Surgical History:  Procedure Laterality Date  . COLONOSCOPY N/A 11/05/2016   Procedure: COLONOSCOPY;  Surgeon: Rogene Houston, MD;  Location: [facility];  Service: Endoscopy;  Laterality: N/A;  1:20  . PARTIAL COLECTOMY N/A 06/27/2016   Procedure: PARTIAL COLECTOMY;  Surgeon: Vickie Epley, MD;  Location: [facility] ORS;  Service: General;  Laterality: N/A;  . PORTACATH PLACEMENT Right 02/09/2017   Procedure: INSERTION PORT-A-CATH;  Surgeon: Aviva Signs, MD;  Location: [facility] ORS;  Service: General;  Laterality: Right;  right subclavian    SOCIAL HISTORY: Social History   Social History  . Marital status: Widowed    Spouse name: N/A  . Number of children: N/A  . Years of education: N/A   Occupational History  . Not on file.   Social History Main Topics  . Smoking status: Former Smoker    Packs/day: 0.50    Years: 48.00    Types: Cigarettes    Quit date: 06/24/2016  . Smokeless tobacco: Never Used     Comment: quit smoking Sept 2017  . Alcohol use No     Comment: glass wine  twice per year  . Drug use: No  . Sexual activity: No     Comment: widowed- 1 daughter   Other Topics Concern  . Not on file   Social History Narrative    . No narrative on file   Widowed for 6 years 1 child 1 grandchild Ex smoker, quit June 24, 2016. Smoked since 71 years old so about 40 years total She is retired. Worked in [employer]. 0 pets  FAMILY HISTORY: Family History  Problem Relation Age of Onset  . Hypertension Mother   . Other Father        some type of "stomach ailment"  . Suicidality Brother   . Stomach cancer Maternal Aunt   . Brain cancer Brother    Mother deceased at 15 yo. 2 years ago. She had high blood pressure. She fell and broke her hip. Didn't have any disease "as far as I know" Father deceased in his 63s. She is not sure what of. He ate Ex Lax all the time for constipation. 2 brothers. The patient is the middle child. Older brother was 29 years-old and committed suicide Youngest brother had brain cancer and died last year  ALLERGIES:  has No Known Allergies.  MEDICATIONS:  Current Outpatient Prescriptions  Medication Sig Dispense Refill  . acetaminophen (TYLENOL) 325 MG tablet Take 325 mg by mouth every 6 (six) hours as needed for mild pain or moderate pain.    Marland Kitchen amLODipine (NORVASC) 5 MG tablet Take 5 mg by mouth daily.    . Ascorbic Acid (VITAMIN C) 1000 MG tablet Take 1,000 mg by mouth daily.    . Cyanocobalamin (B-12) 5000 MCG SUBL Place 1 tablet under the tongue daily. (Patient not taking: Reported on 04/21/2017) 30 tablet 11  . dexamethasone (DECADRON) 4 MG tablet Take 2 tablets (8 mg total) by mouth daily. Start the day after chemotherapy for 2 days. Take with food. (Patient not taking: Reported on 04/21/2017) 30 tablet 1  . escitalopram (LEXAPRO) 20 MG tablet Take 20 mg by mouth daily.     . fluorouracil CALGB 63016 in sodium chloride 0.9 % 150 mL Inject into the vein over 48 hr.    . HYDROcodone-acetaminophen (NORCO) 5-325 MG tablet Take 1 tablet by mouth every 6 (six) hours as needed for moderate pain. (Patient not taking: Reported on 04/21/2017) 30 tablet 0  . iron  polysaccharides (NIFEREX) 150 MG capsule Take 1 capsule (150 mg total) by mouth daily. (Patient not taking: Reported on 04/21/2017) 30 capsule 6  . LEUCOVORIN CALCIUM IV Inject into the vein. Every 2 weeks    . lidocaine-prilocaine (EMLA) cream Apply to  affected area once (Patient not taking: Reported on 04/21/2017) 30 g 3  . Omega-3 Fatty Acids (FISH OIL) 1200 MG CAPS Take 1,200 capsules by mouth daily.     . ondansetron (ZOFRAN) 8 MG tablet Take 1 tablet (8 mg total) by mouth 2 (two) times daily as needed for refractory nausea / vomiting. Start on day 3 after chemotherapy. 30 tablet 1  . OXALIPLATIN IV Inject into the vein. Every 2 weeks    . prochlorperazine (COMPAZINE) 10 MG tablet Take 1 tablet (10 mg total) by mouth every 6 (six) hours as needed (Nausea or vomiting). 30 tablet 1  . simvastatin (ZOCOR) 40 MG tablet Take 40 mg by mouth daily.    Marland Kitchen warfarin (COUMADIN) 5 MG tablet Take 2.5-5 mg by mouth daily. Takes 1 mg daily except 2.5 mg on Mondays and Thursdays.     No current facility-administered medications for this visit.    Facility-Administered Medications Ordered in Other Visits  Medication Dose Route Frequency Provider Last Rate Last Dose  . 0.9 %  sodium chloride infusion   Intravenous Continuous Twana First, MD 20 mL/hr at 04/21/17 1118    . fluorouracil (ADRUCIL) 4,750 mg in sodium chloride 0.9 % 55 mL chemo infusion  2,400 mg/m2 (Treatment Plan Recorded) Intravenous 1 day or 1 dose Twana First, MD      . fluorouracil (ADRUCIL) chemo injection 800 mg  400 mg/m2 (Treatment Plan Recorded) Intravenous Once Twana First, MD      . leucovorin 800 mg in dextrose 5 % 250 mL infusion  404 mg/m2 (Treatment Plan Recorded) Intravenous Once Twana First, MD 145 mL/hr at 04/21/17 1213 800 mg at 04/21/17 1213  . oxaliplatin (ELOXATIN) 170 mg in dextrose 5 % 500 mL chemo infusion  85 mg/m2 (Treatment Plan Recorded) Intravenous Once Twana First, MD 267 mL/hr at 04/21/17 1216 170 mg at 04/21/17  1216   Review of Systems  Constitutional: Negative.  Negative for chills and fever.  HENT: Negative.  Negative for hearing loss, sore throat and tinnitus.   Eyes: Negative.  Negative for blurred vision, photophobia and discharge.  Respiratory: Negative.  Negative for cough, hemoptysis, shortness of breath and wheezing.   Cardiovascular: Negative.  Negative for chest pain, palpitations, orthopnea, claudication and leg swelling.  Gastrointestinal: Positive for diarrhea. Negative for abdominal pain, constipation, melena, nausea and vomiting.  Genitourinary: Negative.  Negative for dysuria and hematuria.  Musculoskeletal: Negative for back pain, joint pain and myalgias.  Skin: Negative.  Negative for itching and rash.  Neurological: Negative.  Negative for dizziness, weakness and headaches.  Endo/Heme/Allergies: Negative.  Negative for environmental allergies and polydipsia. Does not bruise/bleed easily.  Psychiatric/Behavioral: Negative for depression. The patient is not nervous/anxious and does not have insomnia.   All other systems reviewed and are negative. 14 point ROS was done and is otherwise as detailed above or in HPI  PHYSICAL EXAMINATION: ECOG PERFORMANCE STATUS: 1 - Symptomatic but completely ambulatory  Physical Exam  Constitutional: She is oriented to person, place, and time and well-developed, well-nourished, and in no distress. No distress.  HENT:  Head: Normocephalic and atraumatic.  Nose: Nose normal.  Mouth/Throat: Oropharynx is clear and moist. No oropharyngeal exudate.  Eyes: Conjunctivae and EOM are normal. Pupils are equal, round, and reactive to light. Right eye exhibits no discharge. Left eye exhibits no discharge. No scleral icterus.  Neck: Normal range of motion. Neck supple. No JVD present. No tracheal deviation present. No thyromegaly present.  Cardiovascular: Normal rate, regular rhythm  and normal heart sounds.  Exam reveals no gallop and no friction rub.   No  murmur heard. Pulmonary/Chest: Effort normal and breath sounds normal. No respiratory distress. She has no wheezes. She has no rales.  Right chest wall port C/D/I   Abdominal: Soft. Bowel sounds are normal. She exhibits no distension and no mass. There is no tenderness. There is no rebound and no guarding.  Musculoskeletal: Normal range of motion. She exhibits no edema or tenderness.  Lymphadenopathy:    She has no cervical adenopathy.  Neurological: She is alert and oriented to person, place, and time. She has normal reflexes. No cranial nerve deficit. Gait normal. Coordination normal.  Skin: Skin is warm and dry. No rash noted. No erythema. No pallor.  Psychiatric: Mood, memory, affect and judgment normal.  Nursing note and vitals reviewed.  LABORATORY DATA:  I have reviewed the data as listed Lab Results  Component Value Date   WBC 4.7 04/21/2017   HGB 11.9 (L) 04/21/2017   HCT 37.1 04/21/2017   MCV 99.2 04/21/2017   PLT 153 04/21/2017   CMP     Component Value Date/Time   NA 136 04/21/2017 0949   K 3.7 04/21/2017 0949   CL 102 04/21/2017 0949   CO2 24 04/21/2017 0949   GLUCOSE 127 (H) 04/21/2017 0949   BUN 11 04/21/2017 0949   CREATININE 0.77 04/21/2017 0949   CALCIUM 8.5 (L) 04/21/2017 0949   PROT 6.3 (L) 04/21/2017 0949   ALBUMIN 3.5 04/21/2017 0949   AST 33 04/21/2017 0949   ALT 24 04/21/2017 0949   ALKPHOS 48 04/21/2017 0949   BILITOT 0.9 04/21/2017 0949   GFRNONAA >60 04/21/2017 0949   GFRAA >60 04/21/2017 0949   RADIOGRAPHIC STUDIES: I have personally reviewed the radiological images as listed and agreed with the findings in the report.  CT Chest w Contrast 02/06/2017 IMPRESSION: 1. Stable CT chest. No substantial change in the 7 mm short axis left paraesophageal lymph node. 2. Rim enhancing lesion lateral segment left liver has progressed in the interval  CT Abdomen Pelvis w Contrast 12/26/2016 IMPRESSION: 1. Interval development of a 2.4 cm rim enhancing  lesion in the lateral segment left liver, highly suspicious for metastatic disease. 2. New abnormal irregular soft tissue in the cul-de-sac, involving the anterior wall of the rectum and posterior cervix. This is highly suspicious for metastatic involvement (drop lesion). 3. Interval Right hemicolectomy. 4.  Abdominal Aortic Atherosclerois (ICD10-170.0)  PATHOLOGY   ASSESSMENT & PLAN:  Initial 06/2016 with Stage IIA (T3N0M0) adenocarcinoma of colon 06/2016 but now Stage IV, T3 N0 M1 ascending colonic adenocarcinoma with liver met and drop lesion to cul de sac Bilateral pulmonary emboli Chronic anticoagulation Iron deficiency anemia secondary to chronic GI related blood loss    Continue with cycle 6 of palliative FOLFOX+ avastin today. Labs reviewed.   I have ordered a restaging CT C/A/P to be performed in 2 weeks to assess for resectabilty of oligometastatic disease with restaging PET-CT.  She will return for follow up in 2 weeks with labs and to review scans.  I have gone over how to take her imodium PRN with the patient and have advised her to stay hydrated when she has diarrhea. She verbalized understanding.      All questions were answered. The patient knows to call the clinic with any problems, questions or concerns.    This note was electronically signed.    Twana First, MD  04/21/2017 12:24 PM

## 2017-04-21 NOTE — Patient Instructions (Signed)
[facility] Discharge Instructions for Patients Receiving Chemotherapy   Beginning January 23rd 2017 lab work for the [facility] will be done in the  Main lab at [facility] on 1st floor. If you have a lab appointment with the [facility] please come in thru the  Main Entrance and check in at the main information desk   Today you received the following chemotherapy agents: Avastin, Oxaliplatin, Leucovorin and Fluorouracil.   Your pump was started today so you will return in 2 days to have it disconnected.   To help prevent nausea and vomiting after your treatment, we encourage you to take your nausea medication as prescribed.   If you develop nausea and vomiting, or diarrhea that is not controlled by your medication, call the clinic.  The clinic phone number is [phone]. Office hours are Monday-Friday 8:30am-5:00pm.  BELOW ARE SYMPTOMS THAT SHOULD BE REPORTED IMMEDIATELY:  *FEVER GREATER THAN 101.0 F  *CHILLS WITH OR WITHOUT FEVER  NAUSEA AND VOMITING THAT IS NOT CONTROLLED WITH YOUR NAUSEA MEDICATION  *UNUSUAL SHORTNESS OF BREATH  *UNUSUAL BRUISING OR BLEEDING  TENDERNESS IN MOUTH AND THROAT WITH OR WITHOUT PRESENCE OF ULCERS  *URINARY PROBLEMS  *BOWEL PROBLEMS  UNUSUAL RASH Items with * indicate a potential emergency and should be followed up as soon as possible. If you have an emergency after office hours please contact your primary care physician or go to the nearest emergency department.  Please call the clinic during office hours if you have any questions or concerns.   You may also contact the Patient Navigator at [phone] should you have any questions or need assistance in obtaining follow up care.      Resources For Cancer Patients and their Caregivers ? American Cancer Society: Can assist with transportation, wigs, general needs, runs Look Good Feel Better.        (218)058-4575 ? Cancer Care: Provides financial assistance,  online support groups, medication/co-pay assistance.  1-800-813-HOPE 320-808-6185) ? [place] Assists [place] cancer patients and their families through emotional , educational and financial support.  220-327-5397 ? [place] DSS Where to apply for food stamps, Medicaid and utility assistance. 831-162-4049 ? [facility]: Transportation to medical appointments. 226-043-6116 ? Social Security Administration: May apply for disability if have a Stage IV cancer. 450-418-6426 7193423124 ? [employer], Disability and Transit Services: Assists with nutrition, care and transit needs. ([phone]

## 2017-04-22 LAB — CEA: CEA: 12.1 ng/mL — AB (ref 0.0–4.7)

## 2017-04-23 ENCOUNTER — Encounter (HOSPITAL_BASED_OUTPATIENT_CLINIC_OR_DEPARTMENT_OTHER): Payer: Medicare Other

## 2017-04-23 VITALS — BP 122/81 | HR 103 | Temp 99.1°F | Resp 20

## 2017-04-23 DIAGNOSIS — Z452 Encounter for adjustment and management of vascular access device: Secondary | ICD-10-CM

## 2017-04-23 DIAGNOSIS — C182 Malignant neoplasm of ascending colon: Secondary | ICD-10-CM

## 2017-04-23 MED ORDER — SODIUM CHLORIDE 0.9% FLUSH
10.0000 mL | INTRAVENOUS | Status: DC | PRN
  Administered 2017-04-23: 10 mL
  Filled 2017-04-23: qty 10

## 2017-04-23 MED ORDER — HEPARIN SOD (PORK) LOCK FLUSH 100 UNIT/ML IV SOLN
500.0000 [IU] | Freq: Once | INTRAVENOUS | Status: AC | PRN
  Administered 2017-04-23: 500 [IU]
  Filled 2017-04-23: qty 5

## 2017-04-23 NOTE — Progress Notes (Signed)
Janet Hanson presented for Portacath deaccess/flush/pump removal.  Portacath located left chest wall.  Good blood return present. Portacath flushed with 70ml NS and 500U/40ml Heparin and needle removed intact. Procedure without incident. Patient tolerated infusion without any problems.   [name] states that she has used her Imodium teaching sheet to properly take Imodium post diarrhea and this has improved her diarrhea.

## 2017-05-01 ENCOUNTER — Ambulatory Visit (HOSPITAL_COMMUNITY)
Admission: RE | Admit: 2017-05-01 | Discharge: 2017-05-01 | Disposition: A | Discharge status: Home / Self Care | Payer: Medicare Other | Source: Ambulatory Visit | Attending: Oncology | Admitting: Oncology

## 2017-05-01 DIAGNOSIS — I2510 Atherosclerotic heart disease of native coronary artery without angina pectoris: Secondary | ICD-10-CM | POA: Diagnosis not present

## 2017-05-01 DIAGNOSIS — C182 Malignant neoplasm of ascending colon: Secondary | ICD-10-CM | POA: Insufficient documentation

## 2017-05-01 DIAGNOSIS — I712 Thoracic aortic aneurysm, without rupture: Secondary | ICD-10-CM | POA: Diagnosis not present

## 2017-05-01 DIAGNOSIS — I700 Atherosclerosis of aorta: Secondary | ICD-10-CM | POA: Insufficient documentation

## 2017-05-01 DIAGNOSIS — C787 Secondary malignant neoplasm of liver and intrahepatic bile duct: Secondary | ICD-10-CM | POA: Insufficient documentation

## 2017-05-01 MED ORDER — IOPAMIDOL (ISOVUE-300) INJECTION 61%
100.0000 mL | Freq: Once | INTRAVENOUS | Status: AC | PRN
  Administered 2017-05-01: 100 mL via INTRAVENOUS

## 2017-05-05 ENCOUNTER — Encounter (HOSPITAL_COMMUNITY): Payer: Medicare Other | Attending: Hematology & Oncology

## 2017-05-05 ENCOUNTER — Encounter (HOSPITAL_BASED_OUTPATIENT_CLINIC_OR_DEPARTMENT_OTHER): Payer: Medicare Other | Admitting: Oncology

## 2017-05-05 ENCOUNTER — Encounter (HOSPITAL_COMMUNITY): Payer: Self-pay

## 2017-05-05 VITALS — BP 118/78 | HR 101 | Temp 98.5°F | Resp 18 | Wt 177.4 lb

## 2017-05-05 DIAGNOSIS — Z87891 Personal history of nicotine dependence: Secondary | ICD-10-CM | POA: Diagnosis not present

## 2017-05-05 DIAGNOSIS — C182 Malignant neoplasm of ascending colon: Secondary | ICD-10-CM | POA: Insufficient documentation

## 2017-05-05 DIAGNOSIS — Z79899 Other long term (current) drug therapy: Secondary | ICD-10-CM | POA: Diagnosis not present

## 2017-05-05 DIAGNOSIS — Z5112 Encounter for antineoplastic immunotherapy: Secondary | ICD-10-CM

## 2017-05-05 DIAGNOSIS — R197 Diarrhea, unspecified: Secondary | ICD-10-CM | POA: Diagnosis not present

## 2017-05-05 DIAGNOSIS — R530 Neoplastic (malignant) related fatigue: Secondary | ICD-10-CM

## 2017-05-05 DIAGNOSIS — D500 Iron deficiency anemia secondary to blood loss (chronic): Secondary | ICD-10-CM | POA: Diagnosis present

## 2017-05-05 DIAGNOSIS — Z5111 Encounter for antineoplastic chemotherapy: Secondary | ICD-10-CM

## 2017-05-05 DIAGNOSIS — C787 Secondary malignant neoplasm of liver and intrahepatic bile duct: Secondary | ICD-10-CM

## 2017-05-05 LAB — COMPREHENSIVE METABOLIC PANEL
ALK PHOS: 48 U/L (ref 38–126)
ALT: 18 U/L (ref 14–54)
AST: 30 U/L (ref 15–41)
Albumin: 3.3 g/dL — ABNORMAL LOW (ref 3.5–5.0)
Anion gap: 13 (ref 5–15)
BILIRUBIN TOTAL: 0.8 mg/dL (ref 0.3–1.2)
BUN: 11 mg/dL (ref 6–20)
CHLORIDE: 102 mmol/L (ref 101–111)
CO2: 24 mmol/L (ref 22–32)
CREATININE: 0.89 mg/dL (ref 0.44–1.00)
Calcium: 8.3 mg/dL — ABNORMAL LOW (ref 8.9–10.3)
GFR calc Af Amer: >60 mL/min (ref >60)
Glucose, Bld: 142 mg/dL — ABNORMAL HIGH (ref 65–99)
Potassium: 3 mmol/L — ABNORMAL LOW (ref 3.5–5.1)
Sodium: 139 mmol/L (ref 135–145)
Total Protein: 6.2 g/dL — ABNORMAL LOW (ref 6.5–8.1)

## 2017-05-05 LAB — CBC WITH DIFFERENTIAL/PLATELET
Basophils Absolute: 0 10*3/uL (ref 0.0–0.1)
Basophils Relative: 0 %
EOS PCT: 2 %
Eosinophils Absolute: 0.1 10*3/uL (ref 0.0–0.7)
HEMATOCRIT: 34.4 % — AB (ref 36.0–46.0)
HEMOGLOBIN: 11.2 g/dL — AB (ref 12.0–15.0)
Lymphocytes Relative: 49 %
Lymphs Abs: 2 10*3/uL (ref 0.7–4.0)
MCH: 32.8 pg (ref 26.0–34.0)
MCHC: 32.6 g/dL (ref 30.0–36.0)
MCV: 100.9 fL — AB (ref 78.0–100.0)
MONOS PCT: 23 %
Monocytes Absolute: 0.9 10*3/uL (ref 0.1–1.0)
NEUTROS PCT: 26 %
Neutro Abs: 1 10*3/uL — ABNORMAL LOW (ref 1.7–7.7)
Platelets: 153 10*3/uL (ref 150–400)
RBC: 3.41 MIL/uL — AB (ref 3.87–5.11)
RDW: 21.1 % — ABNORMAL HIGH (ref 11.5–15.5)
WBC: 4 10*3/uL (ref 4.0–10.5)

## 2017-05-05 LAB — URINALYSIS, DIPSTICK ONLY
Glucose, UA: NEGATIVE mg/dL
HGB URINE DIPSTICK: NEGATIVE
Leukocytes, UA: NEGATIVE
Nitrite: NEGATIVE
Protein, ur: 30 mg/dL — AB
pH: 6 (ref 5.0–8.0)

## 2017-05-05 MED ORDER — DEXAMETHASONE SODIUM PHOSPHATE 10 MG/ML IJ SOLN
10.0000 mg | Freq: Once | INTRAMUSCULAR | Status: AC
  Administered 2017-05-05: 10 mg via INTRAVENOUS

## 2017-05-05 MED ORDER — PALONOSETRON HCL INJECTION 0.25 MG/5ML
0.2500 mg | Freq: Once | INTRAVENOUS | Status: AC
  Administered 2017-05-05: 0.25 mg via INTRAVENOUS

## 2017-05-05 MED ORDER — DEXTROSE 5 % IV SOLN
Freq: Once | INTRAVENOUS | Status: AC
  Administered 2017-05-05: 12:00:00 via INTRAVENOUS

## 2017-05-05 MED ORDER — PALONOSETRON HCL INJECTION 0.25 MG/5ML
INTRAVENOUS | Status: AC
  Filled 2017-05-05: qty 5

## 2017-05-05 MED ORDER — SODIUM CHLORIDE 0.9 % IV SOLN
5.0000 mg/kg | Freq: Once | INTRAVENOUS | Status: AC
  Administered 2017-05-05: 400 mg via INTRAVENOUS
  Filled 2017-05-05: qty 16

## 2017-05-05 MED ORDER — OXALIPLATIN CHEMO INJECTION 100 MG/20ML
85.0000 mg/m2 | Freq: Once | INTRAVENOUS | Status: AC
  Administered 2017-05-05: 160 mg via INTRAVENOUS
  Filled 2017-05-05: qty 32

## 2017-05-05 MED ORDER — LEUCOVORIN CALCIUM INJECTION 350 MG
397.0000 mg/m2 | Freq: Once | INTRAVENOUS | Status: AC
  Administered 2017-05-05: 750 mg via INTRAVENOUS
  Filled 2017-05-05: qty 35

## 2017-05-05 MED ORDER — FLUOROURACIL CHEMO INJECTION 2.5 GM/50ML
400.0000 mg/m2 | Freq: Once | INTRAVENOUS | Status: AC
  Administered 2017-05-05: 750 mg via INTRAVENOUS
  Filled 2017-05-05: qty 15

## 2017-05-05 MED ORDER — DEXAMETHASONE SODIUM PHOSPHATE 10 MG/ML IJ SOLN
INTRAMUSCULAR | Status: AC
  Filled 2017-05-05: qty 1

## 2017-05-05 MED ORDER — SODIUM CHLORIDE 0.9 % IV SOLN
INTRAVENOUS | Status: DC
  Administered 2017-05-05: 10:00:00 via INTRAVENOUS

## 2017-05-05 MED ORDER — SODIUM CHLORIDE 0.9 % IV SOLN
2400.0000 mg/m2 | INTRAVENOUS | Status: DC
  Administered 2017-05-05: 4550 mg via INTRAVENOUS
  Filled 2017-05-05: qty 91

## 2017-05-05 NOTE — Progress Notes (Signed)
Patient tolerated infusion today without incidence. Patient discharged with home infusion pump running. Discharged ambulatory and in stable condition from clinic to home with daughter. Patient to follow up in 46 hours to have pump disconnected.

## 2017-05-05 NOTE — Progress Notes (Signed)
Labs reviewed with provider. Okay to proceed with treatment today.

## 2017-05-05 NOTE — Progress Notes (Signed)
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 There is a small adjacent foci of gas and free fluid raising the possibility of micro perforation. There is marked dilatation of the cecum and small bowel with fluid and gas most compatible with proximal obstruction caused by the large ascending colonic mass. Additionally there is a 6 cm fatty mass involving the colon at the level the hepatic flexure, most suggestive of a lipoma. Aortic vascular calcifications. Sigmoid colonic diverticulosis without evidence for acute diverticulitis.No specific findings identified to suggest metastatic disease to the chest.There is a 3 mm nodule identified within the left upper lobe, nonspecific.Bilateral pleural effusions with overlying compressive type atelectasis and consolidation       06/26/2016 Tumor Marker    CEA 39.3 ng/ml      06/27/2016 Surgery    Partial colectomy with primary linear stapled anastomosis.  Large bulky mid-ascending colonic mass extending into the colonic mesentery and retroperitoneum with a layer of not visibly/palpably involved fat between the colon and underlying visualized and exposed duodenum and Right kidney      06/27/2016 Pathology Results    COLONIC ADENOCARCINOMA (8.5 CM), GRADE 2 THE TUMOR INVADES THROUGH THE MUSCULARIS PROPRIA INTO PERICOLONIC TISSUE  ALL MARGINS OF RESECTION ARE NEGATIVE FOR CARCINOMA FOURTEEN BENIGN LYMPH NODES (0/14) TUBULAR ADENOMA (X3) UNREMARKABLE APPENDIX      06/30/2016 Imaging    CT angio chest Positive examination for right  upper lobe and right lower lobe pulmonary emboli. No evidence of right heart strain. Again demonstrated are bilateral pleural effusions and basilar atelectasis, greater on the right.      07/30/2016 Tumor Marker      Ref. Range 07/30/2016 16:46  CEA Latest Ref Range: 0.0 - 4.7 ng/mL 4.0        07/30/2016 Miscellaneous    Medical Oncology consultation.  No adjuvant chemotherapy needed based upon risk factors and small benefit.  Informed decision made by patient to not pursue systemic chemotherapy in adjuvant setting.      11/05/2016 Procedure    Colonoscopy by Dr. Laural Golden.  soft anal tags found on digital rectal exam. - Patent end-to-side colo-colonic anastomosis, characterized by healthy appearing mucosa. - Three 5 to 7 mm polyps in the proximal transverse colon, removed with a cold snare. Resected and retrieved. - One 10 mm polyp in the proximal transverse colon, removed with a hot snare. Resected and retrieved. - One 20 mm polyp in the proximal sigmoid colon, removed with a hot snare. Resected and retrieved. Clip (MR conditional) was placed. - One 15 to 25 mm polyp at the recto-sigmoid colon, removed with a hot snare. Resected and retrieved. Clip (MR conditional) was placed. - Diverticulosis in the sigmoid colon and in the descending colon. - The distal rectum and anal verge are normal on retroflexion view.      11/07/2016 Pathology Results    1. Colon, polyp(s), transverse proximal - TUBULAR ADENOMA(S). - HIGH GRADE DYSPLASIA IS NOT IDENTIFIED. 2. Colon, polyp(s), proximal sigmoid - TUBULOVILLOUS ADENOMA WITH FOCAL HIGH GRADE DYSPLASIA (5%). 3. Rectosigmoid , polyp - TUBULOVILLOUS  ADENOMA WITH HIGH GRADE DYSPLASIA (15%).      12/26/2016 Imaging    CT abd/pelvis-1. Interval development of a 2.4 cm rim enhancing lesion in the lateral segment left liver, highly suspicious for metastatic disease. 2. New abnormal irregular soft tissue in the cul-de-sac, involving the anterior wall  of the rectum and posterior cervix. This is highly suspicious for metastatic involvement (drop lesion). 3. Interval Right hemicolectomy. 4.  Abdominal Aortic Atherosclerois (ICD10-170.0)      01/16/2017 Procedure    IR biopsy of hepatic lesion      01/19/2017 Pathology Results    Liver, needle/core biopsy, Left Lobe - ADENOCARCINOMA Microscopic Comment The morphology is consistent with metastatic colorectal adenocarcinoma. There is sufficient tissue for additional studies. Called to [facility] on 01/19/17      02/06/2017 Imaging    CT chest- 1. Stable CT chest. No substantial change in the 7 mm short axis left paraesophageal lymph node. 2. Rim enhancing lesion lateral segment left liver has progressed in the interval      02/09/2017 Procedure    Port-A-Cath insertion by Dr. Arnoldo Morale      02/10/2017 -  Chemotherapy    The patient had palonosetron (ALOXI) injection 0.25 mg, 0.25 mg, Intravenous,  Once, 1 of 4 cycles  leucovorin 800 mg in dextrose 5 % 250 mL infusion, 404 mg/m2 = 792 mg, Intravenous,  Once, 1 of 4 cycles  oxaliplatin (ELOXATIN) 170 mg in dextrose 5 % 500 mL chemo infusion, 85 mg/m2 = 170 mg, Intravenous,  Once, 1 of 4 cycles  fluorouracil (ADRUCIL) chemo injection 800 mg, 400 mg/m2 = 800 mg, Intravenous,  Once, 1 of 4 cycles  fluorouracil (ADRUCIL) 4,750 mg in sodium chloride 0.9 % 55 mL chemo infusion, 2,400 mg/m2 = 4,750 mg, Intravenous, 1 Day/Dose, 1 of 4 cycles  for chemotherapy treatment.        03/11/2017 Pathology Results    Foundation[facility]- KRAS wildtype, NRAS wildtype, MS stable, TMB 6 Muts/Mb, APC U235*, APC T614*, TERT promoter-124C>T, TP53 LOSS.  Therapies with clinical benefit include cetuximab, Panitumumab.      05/01/2017 Imaging    CT C/A/P: 1. The left hepatic metastasis is smaller in the interval. The soft tissue between the anterior wall of the rectum and the posterior uterus is concerning for a drop metastasis, similar in the interval. No  other metastatic disease is identified. 2. The ascending thoracic aorta measures 4.1 cm. Ascending thoracic aortic aneurysm. Recommend semi-annual imaging followup by CTA or MRA and referral to cardiothoracic surgery if not already obtained. This recommendation follows 2010 ACCF/AHA/AATS/ACR/ASA/SCA/SCAI/SIR/STS/SVM Guidelines for the Diagnosis and Management of Patients With Thoracic Aortic Disease. Circulation. 2010; 121: E315-Q008 3. Atherosclerosis in the aorta. 4. Coronary artery calcifications.      HISTORY OF PRESENTING ILLNESS:  Janet Hanson 71 y.o. female is here for follow up of stage IV, T3 N0 M1 ascending colonic adenocarcinoma with liver metastasis.  Avastin was added to FOLFOX starting with cycle 3 of chemo due to evidence of progression due to enlarging liver met.   She is doing well today. She is going to receive cycle 7 of FOLFOX +avastin today. She has been tolerating chemotherapy well. She continues to have diarrhea but has been taking her imodium more frequently. She also noticed that she is more fatigued but continues to be able to do all her ADLs. Denies chest pain, shortness of breath, abdominal pain, or any other concerns.   MEDICAL HISTORY:  Past Medical History:  Diagnosis Date  .  Cancer of right colon (H[facility]) 07/30/2016  . Colon cancer ([facility])   . Depression   . Hypercholesterolemia   . Hypertension   . Iron deficiency anemia due to chronic blood loss 07/30/2016  . Vitamin B12 deficiency 08/13/2016    SURGICAL HISTORY: Past Surgical History:  Procedure Laterality Date  . COLONOSCOPY N/A 11/05/2016   Procedure: COLONOSCOPY;  Surgeon: Malissa Hippo, MD;  Location: [facility];  Service: Endoscopy;  Laterality: N/A;  1:20  . PARTIAL COLECTOMY N/A 06/27/2016   Procedure: PARTIAL COLECTOMY;  Surgeon: Ancil Linsey, MD;  Location: [facility] ORS;  Service: General;  Laterality: N/A;  . PORTACATH PLACEMENT Right 02/09/2017   Procedure: INSERTION PORT-A-CATH;   Surgeon: Franky Macho, MD;  Location: [facility] ORS;  Service: General;  Laterality: Right;  right subclavian    SOCIAL HISTORY: Social History   Social History  . Marital status: Widowed    Spouse name: N/A  . Number of children: N/A  . Years of education: N/A   Occupational History  . Not on file.   Social History Main Topics  . Smoking status: Former Smoker    Packs/day: 0.50    Years: 48.00    Types: Cigarettes    Quit date: 06/24/2016  . Smokeless tobacco: Never Used     Comment: quit smoking Sept 2017  . Alcohol use No     Comment: glass wine  twice per year  . Drug use: No  . Sexual activity: No     Comment: widowed- 1 daughter   Other Topics Concern  . Not on file   Social History Narrative  . No narrative on file   Widowed for 6 years 1 child 1 grandchild Ex smoker, quit June 24, 2016. Smoked since 71 years old so about 40 years total She is retired. Worked in [place] and [place]. 0 pets  FAMILY HISTORY: Family History  Problem Relation Age of Onset  . Hypertension Mother   . Other Father        some type of "stomach ailment"  . Suicidality Brother   . Stomach cancer Maternal Aunt   . Brain cancer Brother    Mother deceased at 78 yo. 2 years ago. She had high blood pressure. She fell and broke her hip. Didn't have any disease "as far as I know" Father deceased in his 65s. She is not sure what of. He ate Ex Lax all the time for constipation. 2 brothers. The patient is the middle child. Older brother was 74 years-old and committed suicide Youngest brother had brain cancer and died last year  ALLERGIES:  has No Known Allergies.  MEDICATIONS:  Current Outpatient Prescriptions  Medication Sig Dispense Refill  . acetaminophen (TYLENOL) 325 MG tablet Take 325 mg by mouth every 6 (six) hours as needed for mild pain or moderate pain.    Marland Kitchen amLODipine (NORVASC) 5 MG tablet Take 5 mg by mouth daily.    . Ascorbic Acid (VITAMIN C) 1000 MG tablet Take  1,000 mg by mouth daily.    . Cyanocobalamin (B-12) 5000 MCG SUBL Place 1 tablet under the tongue daily. 30 tablet 11  . dexamethasone (DECADRON) 4 MG tablet Take 2 tablets (8 mg total) by mouth daily. Start the day after chemotherapy for 2 days. Take with food. 30 tablet 1  . escitalopram (LEXAPRO) 20 MG tablet Take 20 mg by mouth daily.     . fluorouracil CALGB 05942 in sodium chloride 0.9 % 150 mL  Inject into the vein over 48 hr.    . HYDROcodone-acetaminophen (NORCO) 5-325 MG tablet Take 1 tablet by mouth every 6 (six) hours as needed for moderate pain. 30 tablet 0  . iron polysaccharides (NIFEREX) 150 MG capsule Take 1 capsule (150 mg total) by mouth daily. 30 capsule 6  . LEUCOVORIN CALCIUM IV Inject into the vein. Every 2 weeks    . lidocaine-prilocaine (EMLA) cream Apply to affected area once 30 g 3  . Loperamide HCl (IMODIUM PO) Take 1 tablet by mouth as needed.    . Omega-3 Fatty Acids (FISH OIL) 1200 MG CAPS Take 1,200 capsules by mouth daily.     . ondansetron (ZOFRAN) 8 MG tablet Take 1 tablet (8 mg total) by mouth 2 (two) times daily as needed for refractory nausea / vomiting. Start on day 3 after chemotherapy. 30 tablet 1  . OXALIPLATIN IV Inject into the vein. Every 2 weeks    . prochlorperazine (COMPAZINE) 10 MG tablet Take 1 tablet (10 mg total) by mouth every 6 (six) hours as needed (Nausea or vomiting). 30 tablet 1  . simvastatin (ZOCOR) 40 MG tablet Take 40 mg by mouth daily.    Marland Kitchen warfarin (COUMADIN) 5 MG tablet Take 2.5-5 mg by mouth daily. Takes 1 mg daily except 2.5 mg on Mondays and Thursdays.     No current facility-administered medications for this visit.    Facility-Administered Medications Ordered in Other Visits  Medication Dose Route Frequency Provider Last Rate Last Dose  . 0.9 %  sodium chloride infusion   Intravenous Continuous Twana First, MD   Stopped at 05/05/17 1210  . fluorouracil (ADRUCIL) 4,550 mg in sodium chloride 0.9 % 59 mL chemo infusion  2,400  mg/m2 (Treatment Plan Recorded) Intravenous 1 day or 1 dose Twana First, MD      . fluorouracil (ADRUCIL) chemo injection 750 mg  400 mg/m2 (Treatment Plan Recorded) Intravenous Once Twana First, MD      . leucovorin 750 mg in dextrose 5 % 250 mL infusion  397 mg/m2 (Treatment Plan Recorded) Intravenous Once Twana First, MD      . oxaliplatin (ELOXATIN) 160 mg in dextrose 5 % 500 mL chemo infusion  85 mg/m2 (Treatment Plan Recorded) Intravenous Once Twana First, MD       Review of Systems  Constitutional: Negative.  Negative for chills and fever.  HENT: Negative.  Negative for hearing loss, sore throat and tinnitus.   Eyes: Negative.  Negative for blurred vision, photophobia and discharge.  Respiratory: Negative.  Negative for cough, hemoptysis, shortness of breath and wheezing.   Cardiovascular: Negative.  Negative for chest pain, palpitations, orthopnea, claudication and leg swelling.  Gastrointestinal: Positive for diarrhea. Negative for abdominal pain, constipation, melena, nausea and vomiting.  Genitourinary: Negative.  Negative for dysuria and hematuria.  Musculoskeletal: Negative for back pain, joint pain and myalgias.  Skin: Negative.  Negative for itching and rash.  Neurological: Negative.  Negative for dizziness, weakness and headaches.  Endo/Heme/Allergies: Negative.  Negative for environmental allergies and polydipsia. Does not bruise/bleed easily.  Psychiatric/Behavioral: Negative for depression. The patient is not nervous/anxious and does not have insomnia.   All other systems reviewed and are negative. 14 point ROS was done and is otherwise as detailed above or in HPI  PHYSICAL EXAMINATION: ECOG PERFORMANCE STATUS: 1 - Symptomatic but completely ambulatory  Physical Exam  Constitutional: She is oriented to person, place, and time and well-developed, well-nourished, and in no distress. No distress.  HENT:  Head: Normocephalic and atraumatic.  Nose: Nose normal.    Mouth/Throat: Oropharynx is clear and moist. No oropharyngeal exudate.  Eyes: Conjunctivae and EOM are normal. Pupils are equal, round, and reactive to light. Right eye exhibits no discharge. Left eye exhibits no discharge. No scleral icterus.  Neck: Normal range of motion. Neck supple. No JVD present. No tracheal deviation present. No thyromegaly present.  Cardiovascular: Normal rate, regular rhythm and normal heart sounds.  Exam reveals no gallop and no friction rub.   No murmur heard. Pulmonary/Chest: Effort normal and breath sounds normal. No respiratory distress. She has no wheezes. She has no rales.  Right chest wall port C/D/I   Abdominal: Soft. Bowel sounds are normal. She exhibits no distension and no mass. There is no tenderness. There is no rebound and no guarding.  Musculoskeletal: Normal range of motion. She exhibits no edema or tenderness.  Lymphadenopathy:    She has no cervical adenopathy.  Neurological: She is alert and oriented to person, place, and time. She has normal reflexes. No cranial nerve deficit. Gait normal. Coordination normal.  Skin: Skin is warm and dry. No rash noted. No erythema. No pallor.  Psychiatric: Mood, memory, affect and judgment normal.  Nursing note and vitals reviewed.  LABORATORY DATA:  I have reviewed the data as listed Lab Results  Component Value Date   WBC 4.0 05/05/2017   HGB 11.2 (L) 05/05/2017   HCT 34.4 (L) 05/05/2017   MCV 100.9 (H) 05/05/2017   PLT 153 05/05/2017   CMP     Component Value Date/Time   NA 139 05/05/2017 0947   K 3.0 (L) 05/05/2017 0947   CL 102 05/05/2017 0947   CO2 24 05/05/2017 0947   GLUCOSE 142 (H) 05/05/2017 0947   BUN 11 05/05/2017 0947   CREATININE 0.89 05/05/2017 0947   CALCIUM 8.3 (L) 05/05/2017 0947   PROT 6.2 (L) 05/05/2017 0947   ALBUMIN 3.3 (L) 05/05/2017 0947   AST 30 05/05/2017 0947   ALT 18 05/05/2017 0947   ALKPHOS 48 05/05/2017 0947   BILITOT 0.8 05/05/2017 0947   GFRNONAA >60  05/05/2017 0947   GFRAA >60 05/05/2017 0947   RADIOGRAPHIC STUDIES: I have personally reviewed the radiological images as listed and agreed with the findings in the report.  CT Chest w Contrast 02/06/2017 IMPRESSION: 1. Stable CT chest. No substantial change in the 7 mm short axis left paraesophageal lymph node. 2. Rim enhancing lesion lateral segment left liver has progressed in the interval  CT Abdomen Pelvis w Contrast 12/26/2016 IMPRESSION: 1. Interval development of a 2.4 cm rim enhancing lesion in the lateral segment left liver, highly suspicious for metastatic disease. 2. New abnormal irregular soft tissue in the cul-de-sac, involving the anterior wall of the rectum and posterior cervix. This is highly suspicious for metastatic involvement (drop lesion). 3. Interval Right hemicolectomy. 4.  Abdominal Aortic Atherosclerois (ICD10-170.0)  PATHOLOGY   ASSESSMENT & PLAN:  Initial 06/2016 with Stage IIA (T3N0M0) adenocarcinoma of colon 06/2016 but now Stage IV, T3 N0 M1 ascending colonic adenocarcinoma with liver met and drop lesion to cul de sac Bilateral pulmonary emboli Chronic anticoagulation Iron deficiency anemia secondary to chronic GI related blood loss    Continue with cycle 7 of palliative FOLFOX+ avastin today. Labs reviewed.   I have reviewed her restaging scans in detail with her and her daughter today. She's had a good response with decrease in the size of her liver metastases from 2.3 cm to 1.5 cm, her  drop lesion in the cul-de-sac is stable in size. Continue chemotherapy as planned.  I have again reviewed how to take her imodium PRN with the patient and have advised her to stay hydrated when she has diarrhea. She verbalized understanding.    All questions were answered. The patient knows to call the clinic with any problems, questions or concerns.    This note was electronically signed.    Twana First, MD  05/05/2017 12:14 PM

## 2017-05-05 NOTE — Patient Instructions (Signed)
[facility] Discharge Instructions for Patients Receiving Chemotherapy   Beginning January 23rd 2017 lab work for the [facility] will be done in the  Main lab at [facility] on 1st floor. If you have a lab appointment with the [facility] please come in thru the  Main Entrance and check in at the main information desk   Today you received the following chemotherapy agents: Folfox, Avastin You will come back to the clinic on 7/5 to have pump removed.   To help prevent nausea and vomiting after your treatment, we encourage you to take your nausea medication as prescribed.  If you develop nausea and vomiting, or diarrhea that is not controlled by your medication, call the clinic.  The clinic phone number is [phone]. Office hours are Monday-Friday 8:30am-5:00pm.  BELOW ARE SYMPTOMS THAT SHOULD BE REPORTED IMMEDIATELY:  *FEVER GREATER THAN 101.0 F  *CHILLS WITH OR WITHOUT FEVER  NAUSEA AND VOMITING THAT IS NOT CONTROLLED WITH YOUR NAUSEA MEDICATION  *UNUSUAL SHORTNESS OF BREATH  *UNUSUAL BRUISING OR BLEEDING  TENDERNESS IN MOUTH AND THROAT WITH OR WITHOUT PRESENCE OF ULCERS  *URINARY PROBLEMS  *BOWEL PROBLEMS  UNUSUAL RASH Items with * indicate a potential emergency and should be followed up as soon as possible. If you have an emergency after office hours please contact your primary care physician or go to the nearest emergency department.  Please call the clinic during office hours if you have any questions or concerns.   You may also contact the Patient Navigator at [phone] should you have any questions or need assistance in obtaining follow up care.      Resources For Cancer Patients and their Caregivers ? American Cancer Society: Can assist with transportation, wigs, general needs, runs Look Good Feel Better.        904-455-8243 ? Cancer Care: Provides financial assistance, online support groups, medication/co-pay assistance.   1-800-813-HOPE 6018099871) ? [facility] Assists [place] cancer patients and their families through emotional , educational and financial support.  (712) 209-4817 ? [place] DSS Where to apply for food stamps, Medicaid and utility assistance. (442)240-5749 ? RCATS: Transportation to medical appointments. 918-352-7380 ? Social Security Administration: May apply for disability if have a Stage IV cancer. 409-659-4332 603-298-8623 ? [employer], Disability and Transit Services: Assists with nutrition, care and transit needs. 310-212-5786

## 2017-05-06 LAB — CEA: CEA1: 12.2 ng/mL — AB (ref 0.0–4.7)

## 2017-05-07 ENCOUNTER — Encounter (HOSPITAL_BASED_OUTPATIENT_CLINIC_OR_DEPARTMENT_OTHER): Payer: Medicare Other

## 2017-05-07 ENCOUNTER — Encounter (HOSPITAL_COMMUNITY): Payer: Self-pay

## 2017-05-07 VITALS — BP 103/67 | HR 108 | Temp 99.1°F | Resp 20

## 2017-05-07 DIAGNOSIS — C182 Malignant neoplasm of ascending colon: Secondary | ICD-10-CM | POA: Diagnosis present

## 2017-05-07 DIAGNOSIS — Z95828 Presence of other vascular implants and grafts: Secondary | ICD-10-CM

## 2017-05-07 MED ORDER — HEPARIN SOD (PORK) LOCK FLUSH 100 UNIT/ML IV SOLN
INTRAVENOUS | Status: AC
  Filled 2017-05-07: qty 5

## 2017-05-07 MED ORDER — HEPARIN SOD (PORK) LOCK FLUSH 100 UNIT/ML IV SOLN
500.0000 [IU] | Freq: Once | INTRAVENOUS | Status: AC
Start: 2017-05-07 — End: 2017-05-07
  Administered 2017-05-07: 500 [IU] via INTRAVENOUS

## 2017-05-07 MED ORDER — SODIUM CHLORIDE 0.9% FLUSH
10.0000 mL | INTRAVENOUS | Status: DC | PRN
  Administered 2017-05-07: 10 mL via INTRAVENOUS
  Filled 2017-05-07: qty 10

## 2017-05-07 NOTE — Patient Instructions (Signed)
[facility] Discharge Instructions  RECOMMENDATIONS MADE BY THE CONSULTANT AND ANY TEST RESULTS WILL BE SENT TO YOUR REFERRING PHYSICIAN.  Disconnected continuous 5FU pump Follow up as scheduled.  Thank you for choosing [facility] at [facility] to provide your oncology and hematology care.  To afford each patient quality time with our provider, please arrive at least 15 minutes before your scheduled appointment time.    If you have a lab appointment with the Cancer Center please come in thru the  Main Entrance and check in at the main information desk  You need to re-schedule your appointment should you arrive 10 or more minutes late.  We strive to give you quality time with our providers, and arriving late affects you and other patients whose appointments are after yours.  Also, if you no show three or more times for appointments you may be dismissed from the clinic at the providers discretion.     Again, thank you for choosing [facility].  Our hope is that these requests will decrease the amount of time that you wait before being seen by our physicians.       _____________________________________________________________  Should you have questions after your visit to  Cancer Center, please contact our office at [phone] between the hours of 8:30 a.m. and 4:30 p.m.  Voicemails left after 4:30 p.m. will not be returned until the following business day.  For prescription refill requests, have your pharmacy contact our office.       Resources For Cancer Patients and their Caregivers ? American Cancer Society: Can assist with transportation, wigs, general needs, runs Look Good Feel Better.        1-888-227-6333 ? Cancer Care: Provides financial assistance, online support groups, medication/co-pay assistance.  1-800-813-HOPE (4673) ? [facility] Assists [place] cancer  patients and their families through emotional , educational and financial support.  336-427-4357 ? [place] DSS Where to apply for food stamps, Medicaid and utility assistance. 336-342-1394 ? RCATS: Transportation to medical appointments. 336-347-2287 ? Social Security Administration: May apply for disability if have a Stage IV cancer. 336-342-7796 1-800-772-1213 ? [place] [facility], Disability and Transit Services: Assists with nutrition, care and transit needs. 336-349-2343  Cancer Center Support Programs: [email or web address]@ > Cancer Support Group  2nd Tuesday of the month 1pm-2pm, Journey Room  > Creative Journey  3rd Tuesday of the month 1130am-1pm, Journey Room  > Look Good Feel Better  1st Wednesday of the month 10am-12 noon, Journey Room (Call American Cancer Society to register 1-800-395-5775)   

## 2017-05-07 NOTE — Progress Notes (Signed)
Janet Hanson returns today for port de access and flush after 46 hr continous infusion of 64fu. Tolerated infusion without problems. Portacath located rightchest wall was  deaccessed and flushed with 22ml NS and 500U/46ml Heparin and needle removed intact.  Procedure without incident. Patient tolerated procedure well.  Vitals stable and discharged home from clinic ambulatory. Follow up as scheduled.

## 2017-05-19 ENCOUNTER — Encounter (HOSPITAL_COMMUNITY): Payer: Self-pay

## 2017-05-19 ENCOUNTER — Encounter (HOSPITAL_BASED_OUTPATIENT_CLINIC_OR_DEPARTMENT_OTHER): Payer: Medicare Other

## 2017-05-19 VITALS — BP 127/80 | HR 101 | Temp 98.0°F | Resp 16 | Wt 176.2 lb

## 2017-05-19 DIAGNOSIS — Z5112 Encounter for antineoplastic immunotherapy: Secondary | ICD-10-CM

## 2017-05-19 DIAGNOSIS — Z5111 Encounter for antineoplastic chemotherapy: Secondary | ICD-10-CM | POA: Diagnosis present

## 2017-05-19 DIAGNOSIS — C182 Malignant neoplasm of ascending colon: Secondary | ICD-10-CM

## 2017-05-19 DIAGNOSIS — C787 Secondary malignant neoplasm of liver and intrahepatic bile duct: Secondary | ICD-10-CM | POA: Diagnosis not present

## 2017-05-19 LAB — CBC WITH DIFFERENTIAL/PLATELET
Basophils Absolute: 0 10*3/uL (ref 0.0–0.1)
Basophils Relative: 1 %
Eosinophils Absolute: 0.1 10*3/uL (ref 0.0–0.7)
Eosinophils Relative: 1 %
HEMATOCRIT: 34.1 % — AB (ref 36.0–46.0)
HEMOGLOBIN: 11.3 g/dL — AB (ref 12.0–15.0)
LYMPHS ABS: 2.2 10*3/uL (ref 0.7–4.0)
LYMPHS PCT: 46 %
MCH: 34.7 pg — AB (ref 26.0–34.0)
MCHC: 33.1 g/dL (ref 30.0–36.0)
MCV: 104.6 fL — AB (ref 78.0–100.0)
MONO ABS: 1 10*3/uL (ref 0.1–1.0)
MONOS PCT: 22 %
NEUTROS ABS: 1.4 10*3/uL — AB (ref 1.7–7.7)
NEUTROS PCT: 30 %
Platelets: 150 10*3/uL (ref 150–400)
RBC: 3.26 MIL/uL — ABNORMAL LOW (ref 3.87–5.11)
RDW: 20.1 % — ABNORMAL HIGH (ref 11.5–15.5)
WBC: 4.8 10*3/uL (ref 4.0–10.5)

## 2017-05-19 LAB — COMPREHENSIVE METABOLIC PANEL
ALBUMIN: 3.5 g/dL (ref 3.5–5.0)
ALK PHOS: 49 U/L (ref 38–126)
ALT: 17 U/L (ref 14–54)
AST: 31 U/L (ref 15–41)
Anion gap: 14 (ref 5–15)
BILIRUBIN TOTAL: 0.9 mg/dL (ref 0.3–1.2)
BUN: 8 mg/dL (ref 6–20)
CALCIUM: 8.4 mg/dL — AB (ref 8.9–10.3)
CO2: 25 mmol/L (ref 22–32)
Chloride: 102 mmol/L (ref 101–111)
Creatinine, Ser: 0.78 mg/dL (ref 0.44–1.00)
GFR calc Af Amer: >60 mL/min (ref >60)
GLUCOSE: 136 mg/dL — AB (ref 65–99)
Potassium: 3.5 mmol/L (ref 3.5–5.1)
Sodium: 141 mmol/L (ref 135–145)
TOTAL PROTEIN: 6.2 g/dL — AB (ref 6.5–8.1)

## 2017-05-19 LAB — URINALYSIS, DIPSTICK ONLY
Bilirubin Urine: NEGATIVE
GLUCOSE, UA: NEGATIVE mg/dL
HGB URINE DIPSTICK: NEGATIVE
KETONES UR: NEGATIVE mg/dL
LEUKOCYTES UA: NEGATIVE
Nitrite: NEGATIVE
PROTEIN: NEGATIVE mg/dL
Specific Gravity, Urine: >1.03 — ABNORMAL HIGH (ref 1.005–1.030)
pH: 5.5 (ref 5.0–8.0)

## 2017-05-19 MED ORDER — LEUCOVORIN CALCIUM INJECTION 350 MG
397.0000 mg/m2 | Freq: Once | INTRAMUSCULAR | Status: AC
  Administered 2017-05-19: 750 mg via INTRAVENOUS
  Filled 2017-05-19: qty 35

## 2017-05-19 MED ORDER — DEXTROSE 5 % IV SOLN
85.0000 mg/m2 | Freq: Once | INTRAVENOUS | Status: AC
  Administered 2017-05-19: 160 mg via INTRAVENOUS
  Filled 2017-05-19: qty 32

## 2017-05-19 MED ORDER — SODIUM CHLORIDE 0.9 % IV SOLN
5.0000 mg/kg | Freq: Once | INTRAVENOUS | Status: AC
  Administered 2017-05-19: 400 mg via INTRAVENOUS
  Filled 2017-05-19: qty 16

## 2017-05-19 MED ORDER — DEXAMETHASONE SODIUM PHOSPHATE 10 MG/ML IJ SOLN
INTRAMUSCULAR | Status: AC
  Filled 2017-05-19: qty 1

## 2017-05-19 MED ORDER — DEXAMETHASONE SODIUM PHOSPHATE 10 MG/ML IJ SOLN
10.0000 mg | Freq: Once | INTRAMUSCULAR | Status: AC
  Administered 2017-05-19: 10 mg via INTRAVENOUS

## 2017-05-19 MED ORDER — FLUOROURACIL CHEMO INJECTION 2.5 GM/50ML
400.0000 mg/m2 | Freq: Once | INTRAVENOUS | Status: AC
  Administered 2017-05-19: 750 mg via INTRAVENOUS
  Filled 2017-05-19: qty 15

## 2017-05-19 MED ORDER — DEXTROSE 5 % IV SOLN
Freq: Once | INTRAVENOUS | Status: AC
  Administered 2017-05-19: 12:00:00 via INTRAVENOUS

## 2017-05-19 MED ORDER — SODIUM CHLORIDE 0.9% FLUSH
10.0000 mL | INTRAVENOUS | Status: DC | PRN
  Administered 2017-05-19: 10 mL
  Filled 2017-05-19: qty 10

## 2017-05-19 MED ORDER — SODIUM CHLORIDE 0.9 % IV SOLN
2400.0000 mg/m2 | INTRAVENOUS | Status: DC
  Administered 2017-05-19: 4550 mg via INTRAVENOUS
  Filled 2017-05-19: qty 91

## 2017-05-19 MED ORDER — PALONOSETRON HCL INJECTION 0.25 MG/5ML
0.2500 mg | Freq: Once | INTRAVENOUS | Status: AC
  Administered 2017-05-19: 0.25 mg via INTRAVENOUS

## 2017-05-19 MED ORDER — PALONOSETRON HCL INJECTION 0.25 MG/5ML
INTRAVENOUS | Status: AC
  Filled 2017-05-19: qty 5

## 2017-05-19 MED ORDER — SODIUM CHLORIDE 0.9 % IV SOLN
INTRAVENOUS | Status: DC
  Administered 2017-05-19: 11:00:00 via INTRAVENOUS

## 2017-05-19 NOTE — Progress Notes (Signed)
Labs reviewed with MD, proceed with treatment.  Chemotherapy given today per orders. Patient tolerated it well without problems. Vitals stable and discharged home from clinic ambulatory. Continuous 5FU pump connected to patient and running. Follow up as scheduled

## 2017-05-19 NOTE — Patient Instructions (Signed)
[facility] Discharge Instructions for Patients Receiving Chemotherapy   Beginning January 23rd 2017 lab work for the Cancer Center will be done in the  Main lab at [facility] on 1st floor. If you have a lab appointment with the Cancer Center please come in thru the  Main Entrance and check in at the main information desk   Today you received the following chemotherapy agents   To help prevent nausea and vomiting after your treatment, we encourage you to take your nausea medication     If you develop nausea and vomiting, or diarrhea that is not controlled by your medication, call the clinic.  The clinic phone number is [phone]. Office hours are Monday-Friday 8:30am-5:00pm.  BELOW ARE SYMPTOMS THAT SHOULD BE REPORTED IMMEDIATELY:  *FEVER GREATER THAN 101.0 F  *CHILLS WITH OR WITHOUT FEVER  NAUSEA AND VOMITING THAT IS NOT CONTROLLED WITH YOUR NAUSEA MEDICATION  *UNUSUAL SHORTNESS OF BREATH  *UNUSUAL BRUISING OR BLEEDING  TENDERNESS IN MOUTH AND THROAT WITH OR WITHOUT PRESENCE OF ULCERS  *URINARY PROBLEMS  *BOWEL PROBLEMS  UNUSUAL RASH Items with * indicate a potential emergency and should be followed up as soon as possible. If you have an emergency after office hours please contact your primary care physician or go to the nearest emergency department.  Please call the clinic during office hours if you have any questions or concerns.   You may also contact the Patient Navigator at [phone] should you have any questions or need assistance in obtaining follow up care.      Resources For Cancer Patients and their Caregivers ? American Cancer Society: Can assist with transportation, wigs, general needs, runs Look Good Feel Better.        1-888-227-6333 ? Cancer Care: Provides financial assistance, online support groups, medication/co-pay assistance.  1-800-813-HOPE (4673) ? [facility] Assists [place] cancer  patients and their families through emotional , educational and financial support.  336-427-4357 ? [place] DSS Where to apply for food stamps, Medicaid and utility assistance. 336-342-1394 ? RCATS: Transportation to medical appointments. 336-347-2287 ? Social Security Administration: May apply for disability if have a Stage IV cancer. 336-342-7796 1-800-772-1213 ? [place] Aging, Disability and Transit Services: Assists with nutrition, care and transit needs. 336-349-2343         

## 2017-05-20 LAB — CEA: CEA1: 10.2 ng/mL — AB (ref 0.0–4.7)

## 2017-05-21 ENCOUNTER — Encounter (HOSPITAL_COMMUNITY): Payer: Self-pay

## 2017-05-21 ENCOUNTER — Encounter (HOSPITAL_BASED_OUTPATIENT_CLINIC_OR_DEPARTMENT_OTHER): Payer: Medicare Other

## 2017-05-21 VITALS — BP 134/76 | HR 103 | Temp 98.2°F | Resp 18

## 2017-05-21 DIAGNOSIS — C182 Malignant neoplasm of ascending colon: Secondary | ICD-10-CM | POA: Diagnosis not present

## 2017-05-21 DIAGNOSIS — Z452 Encounter for adjustment and management of vascular access device: Secondary | ICD-10-CM | POA: Diagnosis present

## 2017-05-21 MED ORDER — HEPARIN SOD (PORK) LOCK FLUSH 100 UNIT/ML IV SOLN
500.0000 [IU] | Freq: Once | INTRAVENOUS | Status: AC | PRN
  Administered 2017-05-21: 500 [IU]

## 2017-05-21 MED ORDER — SODIUM CHLORIDE 0.9% FLUSH
10.0000 mL | INTRAVENOUS | Status: DC | PRN
  Administered 2017-05-21: 10 mL
  Filled 2017-05-21: qty 10

## 2017-05-21 NOTE — Patient Instructions (Signed)
[facility] Discharge Instructions  RECOMMENDATIONS MADE BY THE CONSULTANT AND ANY TEST RESULTS WILL BE SENT TO YOUR REFERRING PHYSICIAN.  5FU pump discontinued and portacath flushed per protocol today. Follow-up as scheduled. Call clinic for any questions or concerns  Thank you for choosing [facility] to provide your oncology and hematology care.  To afford each patient quality time with our provider, please arrive at least 15 minutes before your scheduled appointment time.    If you have a lab appointment with the [facility] please come in thru the  Main Entrance and check in at the main information desk  You need to re-schedule your appointment should you arrive 10 or more minutes late.  We strive to give you quality time with our providers, and arriving late affects you and other patients whose appointments are after yours.  Also, if you no show three or more times for appointments you may be dismissed from the clinic at the providers discretion.     Again, thank you for choosing [facility].  Our hope is that these requests will decrease the amount of time that you wait before being seen by our physicians.       _____________________________________________________________  Should you have questions after your visit to [facility], please contact our office at [phone] between the hours of 8:30 a.m. and 4:30 p.m.  Voicemails left after 4:30 p.m. will not be returned until the following business day.  For prescription refill requests, have your pharmacy contact our office.       Resources For Cancer Patients and their Caregivers ? American Cancer Society: Can assist with transportation, wigs, general needs, runs Look Good Feel Better.        475 681 4026 ? Cancer Care: Provides financial assistance, online support groups, medication/co-pay assistance.  1-800-813-HOPE  847-366-1634) ? [facility] Assists [place] cancer patients and their families through emotional , educational and financial support.  323 467 2304 ? [place] DSS Where to apply for food stamps, Medicaid and utility assistance. (989)672-9890 ? RCATS: Transportation to medical appointments. 831-850-2661 ? Social Security Administration: May apply for disability if have a Stage IV cancer. 8628547626 (240)537-6818 ? [employer], Disability and Transit Services: Assists with nutrition, care and transit needs. [place] Support Programs: @10RELATIVEDAYS @ > Cancer Support Group  2nd Tuesday of the month 1pm-2pm, Journey Room  > Creative Journey  3rd Tuesday of the month 1130am-1pm, Journey Room  > Look Good Feel Better  1st Wednesday of the month 10am-12 noon, Journey Room (Call [place] to register [phone])

## 2017-05-21 NOTE — Progress Notes (Signed)
Janet Hanson tolerated discontinuation of 5FU pump with port flush well without complaints or incident. Port flushed with 10 ml NS and 5 ml Heparin easily per protocol after 5FU pump discontinued the needle de-accessed. VSS Pt discharged self ambulatory in satisfactory condition

## 2017-06-02 ENCOUNTER — Encounter (HOSPITAL_BASED_OUTPATIENT_CLINIC_OR_DEPARTMENT_OTHER): Payer: Medicare Other

## 2017-06-02 ENCOUNTER — Encounter (HOSPITAL_COMMUNITY): Payer: Self-pay

## 2017-06-02 ENCOUNTER — Encounter (HOSPITAL_BASED_OUTPATIENT_CLINIC_OR_DEPARTMENT_OTHER): Payer: Medicare Other | Admitting: Oncology

## 2017-06-02 ENCOUNTER — Encounter: Payer: Self-pay | Admitting: *Deleted

## 2017-06-02 VITALS — BP 138/86 | HR 112 | Temp 97.8°F | Resp 18 | Wt 169.6 lb

## 2017-06-02 DIAGNOSIS — Z7901 Long term (current) use of anticoagulants: Secondary | ICD-10-CM | POA: Diagnosis not present

## 2017-06-02 DIAGNOSIS — C182 Malignant neoplasm of ascending colon: Secondary | ICD-10-CM | POA: Diagnosis present

## 2017-06-02 DIAGNOSIS — D500 Iron deficiency anemia secondary to blood loss (chronic): Secondary | ICD-10-CM | POA: Diagnosis not present

## 2017-06-02 DIAGNOSIS — Z5112 Encounter for antineoplastic immunotherapy: Secondary | ICD-10-CM | POA: Diagnosis not present

## 2017-06-02 DIAGNOSIS — Z86711 Personal history of pulmonary embolism: Secondary | ICD-10-CM

## 2017-06-02 DIAGNOSIS — C787 Secondary malignant neoplasm of liver and intrahepatic bile duct: Secondary | ICD-10-CM | POA: Diagnosis not present

## 2017-06-02 DIAGNOSIS — Z5111 Encounter for antineoplastic chemotherapy: Secondary | ICD-10-CM | POA: Diagnosis not present

## 2017-06-02 LAB — COMPREHENSIVE METABOLIC PANEL
ALBUMIN: 3.2 g/dL — AB (ref 3.5–5.0)
ALK PHOS: 50 U/L (ref 38–126)
ALT: 17 U/L (ref 14–54)
AST: 29 U/L (ref 15–41)
Anion gap: 11 (ref 5–15)
BILIRUBIN TOTAL: 0.6 mg/dL (ref 0.3–1.2)
BUN: 12 mg/dL (ref 6–20)
CALCIUM: 7.9 mg/dL — AB (ref 8.9–10.3)
CO2: 26 mmol/L (ref 22–32)
Chloride: 104 mmol/L (ref 101–111)
Creatinine, Ser: 0.97 mg/dL (ref 0.44–1.00)
GFR calc Af Amer: >60 mL/min (ref >60)
GFR calc non Af Amer: 58 mL/min — ABNORMAL LOW (ref >60)
GLUCOSE: 142 mg/dL — AB (ref 65–99)
Potassium: 3 mmol/L — ABNORMAL LOW (ref 3.5–5.1)
SODIUM: 141 mmol/L (ref 135–145)
TOTAL PROTEIN: 5.9 g/dL — AB (ref 6.5–8.1)

## 2017-06-02 LAB — URINALYSIS, DIPSTICK ONLY
GLUCOSE, UA: 50 mg/dL — AB
HGB URINE DIPSTICK: NEGATIVE
KETONES UR: 5 mg/dL — AB
LEUKOCYTES UA: NEGATIVE
Nitrite: NEGATIVE
PH: 5 (ref 5.0–8.0)
PROTEIN: 100 mg/dL — AB
Specific Gravity, Urine: 1.031 — ABNORMAL HIGH (ref 1.005–1.030)

## 2017-06-02 LAB — CBC WITH DIFFERENTIAL/PLATELET
BASOS ABS: 0 10*3/uL (ref 0.0–0.1)
BASOS PCT: 0 %
EOS ABS: 0 10*3/uL (ref 0.0–0.7)
Eosinophils Relative: 1 %
HEMATOCRIT: 35 % — AB (ref 36.0–46.0)
HEMOGLOBIN: 11.5 g/dL — AB (ref 12.0–15.0)
Lymphocytes Relative: 42 %
Lymphs Abs: 1.8 10*3/uL (ref 0.7–4.0)
MCH: 34.4 pg — ABNORMAL HIGH (ref 26.0–34.0)
MCHC: 32.9 g/dL (ref 30.0–36.0)
MCV: 104.8 fL — ABNORMAL HIGH (ref 78.0–100.0)
MONOS PCT: 26 %
Monocytes Absolute: 1.1 10*3/uL — ABNORMAL HIGH (ref 0.1–1.0)
NEUTROS ABS: 1.3 10*3/uL — AB (ref 1.7–7.7)
NEUTROS PCT: 31 %
Platelets: 125 10*3/uL — ABNORMAL LOW (ref 150–400)
RBC: 3.34 MIL/uL — AB (ref 3.87–5.11)
RDW: 18.4 % — ABNORMAL HIGH (ref 11.5–15.5)
WBC: 4.3 10*3/uL (ref 4.0–10.5)

## 2017-06-02 MED ORDER — FLUOROURACIL CHEMO INJECTION 2.5 GM/50ML
400.0000 mg/m2 | Freq: Once | INTRAVENOUS | Status: AC
  Administered 2017-06-02: 750 mg via INTRAVENOUS
  Filled 2017-06-02: qty 10

## 2017-06-02 MED ORDER — DEXAMETHASONE SODIUM PHOSPHATE 10 MG/ML IJ SOLN
10.0000 mg | Freq: Once | INTRAMUSCULAR | Status: AC
  Administered 2017-06-02: 10 mg via INTRAVENOUS

## 2017-06-02 MED ORDER — DEXTROSE 5 % IV SOLN
Freq: Once | INTRAVENOUS | Status: AC
  Administered 2017-06-02: 11:00:00 via INTRAVENOUS

## 2017-06-02 MED ORDER — PALONOSETRON HCL INJECTION 0.25 MG/5ML
0.2500 mg | Freq: Once | INTRAVENOUS | Status: AC
  Administered 2017-06-02: 0.25 mg via INTRAVENOUS

## 2017-06-02 MED ORDER — LEUCOVORIN CALCIUM INJECTION 350 MG
397.0000 mg/m2 | Freq: Once | INTRAVENOUS | Status: AC
  Administered 2017-06-02: 750 mg via INTRAVENOUS
  Filled 2017-06-02: qty 35

## 2017-06-02 MED ORDER — POTASSIUM CHLORIDE CRYS ER 20 MEQ PO TBCR
60.0000 meq | EXTENDED_RELEASE_TABLET | Freq: Once | ORAL | Status: AC
  Administered 2017-06-02: 60 meq via ORAL

## 2017-06-02 MED ORDER — SODIUM CHLORIDE 0.9 % IV SOLN
2400.0000 mg/m2 | INTRAVENOUS | Status: DC
  Administered 2017-06-02: 4550 mg via INTRAVENOUS
  Filled 2017-06-02: qty 91

## 2017-06-02 MED ORDER — SODIUM CHLORIDE 0.9 % IV SOLN
5.0000 mg/kg | Freq: Once | INTRAVENOUS | Status: AC
  Administered 2017-06-02: 400 mg via INTRAVENOUS
  Filled 2017-06-02: qty 16

## 2017-06-02 MED ORDER — POTASSIUM CHLORIDE CRYS ER 20 MEQ PO TBCR
EXTENDED_RELEASE_TABLET | ORAL | Status: AC
  Filled 2017-06-02: qty 3

## 2017-06-02 MED ORDER — PALONOSETRON HCL INJECTION 0.25 MG/5ML
INTRAVENOUS | Status: AC
  Filled 2017-06-02: qty 5

## 2017-06-02 MED ORDER — SODIUM CHLORIDE 0.9 % IV SOLN
INTRAVENOUS | Status: DC
  Administered 2017-06-02: 09:00:00 via INTRAVENOUS

## 2017-06-02 MED ORDER — OXALIPLATIN CHEMO INJECTION 100 MG/20ML
85.0000 mg/m2 | Freq: Once | INTRAVENOUS | Status: AC
  Administered 2017-06-02: 160 mg via INTRAVENOUS
  Filled 2017-06-02: qty 32

## 2017-06-02 MED ORDER — DEXAMETHASONE SODIUM PHOSPHATE 10 MG/ML IJ SOLN
INTRAMUSCULAR | Status: AC
Start: 2017-06-02 — End: 2017-06-02
  Filled 2017-06-02: qty 1

## 2017-06-02 NOTE — Progress Notes (Signed)
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 There is a small adjacent foci of gas and free fluid raising the possibility of micro perforation. There is marked dilatation of the cecum and small bowel with fluid and gas most compatible with proximal obstruction caused by the large ascending colonic mass. Additionally there is a 6 cm fatty mass involving the colon at the level the hepatic flexure, most suggestive of a lipoma. Aortic vascular calcifications. Sigmoid colonic diverticulosis without evidence for acute diverticulitis.No specific findings identified to suggest metastatic disease to the chest.There is a 3 mm nodule identified within the left upper lobe, nonspecific.Bilateral pleural effusions with overlying compressive type atelectasis and consolidation       06/26/2016 Tumor Marker    CEA 39.3 ng/ml      06/27/2016 Surgery    Partial colectomy with primary linear stapled anastomosis.  Large bulky mid-ascending colonic mass extending into the colonic mesentery and retroperitoneum with a layer of not visibly/palpably involved fat between the colon and underlying visualized and exposed duodenum and Right kidney      06/27/2016 Pathology Results    COLONIC ADENOCARCINOMA (8.5 CM), GRADE 2 THE TUMOR INVADES THROUGH THE MUSCULARIS PROPRIA INTO PERICOLONIC TISSUE  ALL MARGINS OF RESECTION ARE NEGATIVE FOR CARCINOMA FOURTEEN BENIGN LYMPH NODES (0/14) TUBULAR ADENOMA (X3) UNREMARKABLE APPENDIX      06/30/2016 Imaging    CT angio chest Positive examination for right  upper lobe and right lower lobe pulmonary emboli. No evidence of right heart strain. Again demonstrated are bilateral pleural effusions and basilar atelectasis, greater on the right.      07/30/2016 Tumor Marker      Ref. Range 07/30/2016 16:46  CEA Latest Ref Range: 0.0 - 4.7 ng/mL 4.0        07/30/2016 Miscellaneous    Medical Oncology consultation.  No adjuvant chemotherapy needed based upon risk factors and small benefit.  Informed decision made by patient to not pursue systemic chemotherapy in adjuvant setting.      11/05/2016 Procedure    Colonoscopy by Dr. Laural Golden.  soft anal tags found on digital rectal exam. - Patent end-to-side colo-colonic anastomosis, characterized by healthy appearing mucosa. - Three 5 to 7 mm polyps in the proximal transverse colon, removed with a cold snare. Resected and retrieved. - One 10 mm polyp in the proximal transverse colon, removed with a hot snare. Resected and retrieved. - One 20 mm polyp in the proximal sigmoid colon, removed with a hot snare. Resected and retrieved. Clip (MR conditional) was placed. - One 15 to 25 mm polyp at the recto-sigmoid colon, removed with a hot snare. Resected and retrieved. Clip (MR conditional) was placed. - Diverticulosis in the sigmoid colon and in the descending colon. - The distal rectum and anal verge are normal on retroflexion view.      11/07/2016 Pathology Results    1. Colon, polyp(s), transverse proximal - TUBULAR ADENOMA(S). - HIGH GRADE DYSPLASIA IS NOT IDENTIFIED. 2. Colon, polyp(s), proximal sigmoid - TUBULOVILLOUS ADENOMA WITH FOCAL HIGH GRADE DYSPLASIA (5%). 3. Rectosigmoid , polyp - TUBULOVILLOUS  ADENOMA WITH HIGH GRADE DYSPLASIA (15%).      12/26/2016 Imaging    CT abd/pelvis-1. Interval development of a 2.4 cm rim enhancing lesion in the lateral segment left liver, highly suspicious for metastatic disease. 2. New abnormal irregular soft tissue in the cul-de-sac, involving the anterior wall  of the rectum and posterior cervix. This is highly suspicious for metastatic involvement (drop lesion). 3. Interval Right hemicolectomy. 4.  Abdominal Aortic Atherosclerois (ICD10-170.0)      01/16/2017 Procedure    IR biopsy of hepatic lesion      01/19/2017 Pathology Results    Liver, needle/core biopsy, Left Lobe - ADENOCARCINOMA Microscopic Comment The morphology is consistent with metastatic colorectal adenocarcinoma. There is sufficient tissue for additional studies. Called to [facility] on 01/19/17      02/06/2017 Imaging    CT chest- 1. Stable CT chest. No substantial change in the 7 mm short axis left paraesophageal lymph node. 2. Rim enhancing lesion lateral segment left liver has progressed in the interval      02/09/2017 Procedure    Port-A-Cath insertion by Dr. Arnoldo Morale      02/10/2017 -  Chemotherapy    The patient had palonosetron (ALOXI) injection 0.25 mg, 0.25 mg, Intravenous,  Once, 1 of 4 cycles  leucovorin 800 mg in dextrose 5 % 250 mL infusion, 404 mg/m2 = 792 mg, Intravenous,  Once, 1 of 4 cycles  oxaliplatin (ELOXATIN) 170 mg in dextrose 5 % 500 mL chemo infusion, 85 mg/m2 = 170 mg, Intravenous,  Once, 1 of 4 cycles  fluorouracil (ADRUCIL) chemo injection 800 mg, 400 mg/m2 = 800 mg, Intravenous,  Once, 1 of 4 cycles  fluorouracil (ADRUCIL) 4,750 mg in sodium chloride 0.9 % 55 mL chemo infusion, 2,400 mg/m2 = 4,750 mg, Intravenous, 1 Day/Dose, 1 of 4 cycles  for chemotherapy treatment.        03/11/2017 Pathology Results    FoundationONE- KRAS wildtype, NRAS wildtype, MS stable, TMB 6 Muts/Mb, APC R427*, APC C623*, TERT promoter-124C>T, TP53 LOSS.  Therapies with clinical benefit include cetuximab, Panitumumab.      05/01/2017 Imaging    CT C/A/P: 1. The left hepatic metastasis is smaller in the interval. The soft tissue between the anterior wall of the rectum and the posterior uterus is concerning for a drop metastasis, similar in the interval. No  other metastatic disease is identified. 2. The ascending thoracic aorta measures 4.1 cm. Ascending thoracic aortic aneurysm. Recommend semi-annual imaging followup by CTA or MRA and referral to cardiothoracic surgery if not already obtained. This recommendation follows 2010 ACCF/AHA/AATS/ACR/ASA/SCA/SCAI/SIR/STS/SVM Guidelines for the Diagnosis and Management of Patients With Thoracic Aortic Disease. Circulation. 2010; 121: J628-B151 3. Atherosclerosis in the aorta. 4. Coronary artery calcifications.      HISTORY OF PRESENTING ILLNESS:  Janet Hanson 71 y.o. female is here for follow up of stage IV, T3 N0 M1 ascending colonic adenocarcinoma with liver metastasis.  Avastin was added to FOLFOX starting with cycle 3 of chemo due to evidence of progression due to enlarging liver met.   She is doing well today. She is going to receive cycle 9 of FOLFOX +avastin today. Patient was not doing well this morning. She had gone to the beach over the weekend and had a good time. Then this morning she had nausea, vomiting, diarrhea. Currently all her symptoms have resolved. She denies eating any new foods over the weekend. Denies chest pain, shortness of breath, abdominal pain, or any other concerns.   MEDICAL HISTORY:  Past Medical History:  Diagnosis Date  . Cancer of right colon ([place]) 07/30/2016  . Colon cancer ([place])   . Depression   . Hypercholesterolemia   . Hypertension   . Iron deficiency anemia due to chronic blood loss 07/30/2016  . Vitamin B12 deficiency 08/13/2016    SURGICAL HISTORY: Past Surgical History:  Procedure Laterality Date  . COLONOSCOPY N/A 11/05/2016   Procedure: COLONOSCOPY;  Surgeon: Rogene Houston, MD;  Location: [facility] ENDO SUITE;  Service: Endoscopy;  Laterality: N/A;  1:20  . PARTIAL COLECTOMY N/A 06/27/2016   Procedure: PARTIAL COLECTOMY;  Surgeon: Vickie Epley, MD;  Location: [facility] ORS;  Service: General;  Laterality: N/A;  . PORTACATH PLACEMENT Right 02/09/2017    Procedure: INSERTION PORT-A-CATH;  Surgeon: Aviva Signs, MD;  Location: [facility] ORS;  Service: General;  Laterality: Right;  right subclavian    SOCIAL HISTORY: Social History   Social History  . Marital status: Widowed    Spouse name: N/A  . Number of children: N/A  . Years of education: N/A   Occupational History  . Not on file.   Social History Main Topics  . Smoking status: Former Smoker    Packs/day: 0.50    Years: 48.00    Types: Cigarettes    Quit date: 06/24/2016  . Smokeless tobacco: Never Used     Comment: quit smoking Sept 2017  . Alcohol use No     Comment: glass wine  twice per year  . Drug use: No  . Sexual activity: No     Comment: widowed- 1 daughter   Other Topics Concern  . Not on file   Social History Narrative  . No narrative on file   Widowed for 6 years 1 child 1 grandchild Ex smoker, quit June 24, 2016. Smoked since 71 years old so about 40 years total She is retired. Worked in [place] and [place]. 0 pets  FAMILY HISTORY: Family History  Problem Relation Age of Onset  . Hypertension Mother   . Other Father        some type of "stomach ailment"  . Suicidality Brother   . Stomach cancer Maternal Aunt   . Brain cancer Brother    Mother deceased at 30 yo. 2 years ago. She had high blood pressure. She fell and broke her hip. Didn't have any disease "as far as I know" Father deceased in his 69s. She is not sure what of. He ate Ex Lax all the time for constipation. 2 brothers. The patient is the middle child. Older brother was 74 years-old and committed suicide Youngest brother had brain cancer and died last year  ALLERGIES:  has No Known Allergies.  MEDICATIONS:  Current Outpatient Prescriptions  Medication Sig Dispense Refill  . acetaminophen (TYLENOL) 325 MG tablet Take 325 mg by mouth every 6 (six) hours as needed for mild pain or moderate pain.    Marland Kitchen amLODipine (NORVASC) 5 MG tablet Take 5 mg by mouth daily.    . Ascorbic Acid  (VITAMIN C) 1000 MG tablet Take 1,000 mg by mouth daily.    . Cyanocobalamin (B-12) 5000 MCG SUBL Place 1 tablet under the tongue daily. 30 tablet 11  . dexamethasone (DECADRON) 4 MG tablet Take 2 tablets (8 mg total) by mouth daily. Start the day after chemotherapy for 2 days. Take with food. 30 tablet 1  . escitalopram (LEXAPRO) 20 MG tablet Take 20 mg by mouth daily.     . fluorouracil CALGB  16106 in sodium chloride 0.9 % 150 mL Inject into the vein over 48 hr.    . HYDROcodone-acetaminophen (NORCO) 5-325 MG tablet Take 1 tablet by mouth every 6 (six) hours as needed for moderate pain. 30 tablet 0  . iron polysaccharides (NIFEREX) 150 MG capsule Take 1 capsule (150 mg total) by mouth daily. 30 capsule 6  . LEUCOVORIN CALCIUM IV Inject into the vein. Every 2 weeks    . lidocaine-prilocaine (EMLA) cream Apply to affected area once 30 g 3  . Loperamide HCl (IMODIUM PO) Take 1 tablet by mouth as needed.    . Omega-3 Fatty Acids (FISH OIL) 1200 MG CAPS Take 1,200 capsules by mouth daily.     . ondansetron (ZOFRAN) 8 MG tablet Take 1 tablet (8 mg total) by mouth 2 (two) times daily as needed for refractory nausea / vomiting. Start on day 3 after chemotherapy. 30 tablet 1  . OXALIPLATIN IV Inject into the vein. Every 2 weeks    . prochlorperazine (COMPAZINE) 10 MG tablet Take 1 tablet (10 mg total) by mouth every 6 (six) hours as needed (Nausea or vomiting). 30 tablet 1  . simvastatin (ZOCOR) 40 MG tablet Take 40 mg by mouth daily.    Marland Kitchen warfarin (COUMADIN) 5 MG tablet Take 2.5-5 mg by mouth daily. Takes 1 mg daily except 2.5 mg on Mondays and Thursdays.     No current facility-administered medications for this visit.    Facility-Administered Medications Ordered in Other Visits  Medication Dose Route Frequency Provider Last Rate Last Dose  . 0.9 %  sodium chloride infusion   Intravenous Continuous Ralene Cork, MD 20 mL/hr at 06/02/17 0915    . dextrose 5 % solution   Intravenous Once Ralene Cork,  MD      . fluorouracil (ADRUCIL) 4,550 mg in sodium chloride 0.9 % 59 mL chemo infusion  2,400 mg/m2 (Treatment Plan Recorded) Intravenous 1 day or 1 dose Ralene Cork, MD      . fluorouracil (ADRUCIL) chemo injection 750 mg  400 mg/m2 (Treatment Plan Recorded) Intravenous Once Ralene Cork, MD      . leucovorin 750 mg in dextrose 5 % 250 mL infusion  397 mg/m2 (Treatment Plan Recorded) Intravenous Once Ralene Cork, MD      . oxaliplatin (ELOXATIN) 160 mg in dextrose 5 % 500 mL chemo infusion  85 mg/m2 (Treatment Plan Recorded) Intravenous Once Ralene Cork, MD       Review of Systems  Constitutional: Negative.  Negative for chills and fever.  HENT: Negative.  Negative for hearing loss, sore throat and tinnitus.   Eyes: Negative.  Negative for blurred vision, photophobia and discharge.  Respiratory: Negative.  Negative for cough, hemoptysis, shortness of breath and wheezing.   Cardiovascular: Negative.  Negative for chest pain, palpitations, orthopnea, claudication and leg swelling.  Gastrointestinal: Positive for diarrhea, nausea and vomiting. Negative for abdominal pain, constipation and melena.  Genitourinary: Negative.  Negative for dysuria and hematuria.  Musculoskeletal: Negative for back pain, joint pain and myalgias.  Skin: Negative.  Negative for itching and rash.  Neurological: Negative.  Negative for dizziness, weakness and headaches.  Endo/Heme/Allergies: Negative.  Negative for environmental allergies and polydipsia. Does not bruise/bleed easily.  Psychiatric/Behavioral: Negative for depression. The patient is not nervous/anxious and does not have insomnia.   All other systems reviewed and are negative. 14 point ROS was done and is otherwise as detailed above or in HPI  PHYSICAL EXAMINATION: ECOG PERFORMANCE STATUS: 1 - Symptomatic but  completely ambulatory  Physical Exam  Constitutional: She is oriented to person, place, and time and well-developed, well-nourished, and in no  distress. No distress.  HENT:  Head: Normocephalic and atraumatic.  Nose: Nose normal.  Mouth/Throat: Oropharynx is clear and moist. No oropharyngeal exudate.  Eyes: Pupils are equal, round, and reactive to light. Conjunctivae and EOM are normal. Right eye exhibits no discharge. Left eye exhibits no discharge. No scleral icterus.  Neck: Normal range of motion. Neck supple. No JVD present. No tracheal deviation present. No thyromegaly present.  Cardiovascular: Normal rate, regular rhythm and normal heart sounds.  Exam reveals no gallop and no friction rub.   No murmur heard. Pulmonary/Chest: Effort normal and breath sounds normal. No respiratory distress. She has no wheezes. She has no rales.  Right chest wall port C/D/I   Abdominal: Soft. Bowel sounds are normal. She exhibits no distension and no mass. There is no tenderness. There is no rebound and no guarding.  Musculoskeletal: Normal range of motion. She exhibits no edema or tenderness.  Lymphadenopathy:    She has no cervical adenopathy.  Neurological: She is alert and oriented to person, place, and time. She has normal reflexes. No cranial nerve deficit. Gait normal. Coordination normal.  Skin: Skin is warm and dry. No rash noted. No erythema. No pallor.  Psychiatric: Mood, memory, affect and judgment normal.  Nursing note and vitals reviewed.  LABORATORY DATA:  I have reviewed the data as listed Lab Results  Component Value Date   WBC 4.3 06/02/2017   HGB 11.5 (L) 06/02/2017   HCT 35.0 (L) 06/02/2017   MCV 104.8 (H) 06/02/2017   PLT 125 (L) 06/02/2017   CMP     Component Value Date/Time   NA 141 06/02/2017 0840   K 3.0 (L) 06/02/2017 0840   CL 104 06/02/2017 0840   CO2 26 06/02/2017 0840   GLUCOSE 142 (H) 06/02/2017 0840   BUN 12 06/02/2017 0840   CREATININE 0.97 06/02/2017 0840   CALCIUM 7.9 (L) 06/02/2017 0840   PROT 5.9 (L) 06/02/2017 0840   ALBUMIN 3.2 (L) 06/02/2017 0840   AST 29 06/02/2017 0840   ALT 17  06/02/2017 0840   ALKPHOS 50 06/02/2017 0840   BILITOT 0.6 06/02/2017 0840   GFRNONAA 58 (L) 06/02/2017 0840   GFRAA >60 06/02/2017 0840   RADIOGRAPHIC STUDIES: I have personally reviewed the radiological images as listed and agreed with the findings in the report.  CT Chest w Contrast 02/06/2017 IMPRESSION: 1. Stable CT chest. No substantial change in the 7 mm short axis left paraesophageal lymph node. 2. Rim enhancing lesion lateral segment left liver has progressed in the interval  CT Abdomen Pelvis w Contrast 12/26/2016 IMPRESSION: 1. Interval development of a 2.4 cm rim enhancing lesion in the lateral segment left liver, highly suspicious for metastatic disease. 2. New abnormal irregular soft tissue in the cul-de-sac, involving the anterior wall of the rectum and posterior cervix. This is highly suspicious for metastatic involvement (drop lesion). 3. Interval Right hemicolectomy. 4.  Abdominal Aortic Atherosclerois (ICD10-170.0)  PATHOLOGY   ASSESSMENT & PLAN:  Initial 06/2016 with Stage IIA (T3N0M0) adenocarcinoma of colon 06/2016 but now Stage IV, T3 N0 M1 ascending colonic adenocarcinoma with liver met and drop lesion to cul de sac Bilateral pulmonary emboli Chronic anticoagulation Iron deficiency anemia secondary to chronic GI related blood loss    Continue with cycle 9 of palliative FOLFOX+ avastin today. Labs reviewed.   I have again reviewed how to take  her imodium PRN with the patient and have advised her to stay hydrated when she has diarrhea. She verbalized understanding.    Hypokalemic today likely due to her GI symptoms this am. I will give her KCL 90mq PO x1.   Plan to repeat restaging scans after cycle 12 of chemo.  RTC in 4 weeks for follow up.  All questions were answered. The patient knows to call the clinic with any problems, questions or concerns.    This note was electronically signed.    LTwana First MD  06/02/2017 10:31 AM

## 2017-06-02 NOTE — Progress Notes (Signed)
Labs reviewed with Dr. Talbert Cage - okay to tx today per MD.  KDur 60 mEq po x 1 dose ordered for K+ 3.0.  Awaiting UA specimen prior to Avastin tx.  UA results reviewed with Dr. Talbert Cage.  Okay to give Avastin today per MD.  Tolerated infusions w/o adverse reaction.  Alert, in no distress.  VSS.  Discharged ambulatory in c/o family.

## 2017-06-02 NOTE — Progress Notes (Signed)
[facility] Clinical Social Work  Clinical Social Work was referred by rounding in the infusion area. CSW introduced self, explained role of CSW/Pt and Family Support Team, support groups and other resources to assist. Pt did not remember meeting CSW several months ago. CSW re-reviewed role of CSW and provided handout and support calendar. Pt denied current needs and CSW encouraged her to reach out as needed.   Clinical Social Work interventions: Resource education  Loren Racer, LCSW, OSW-C [place] Tuesdays   [phone]

## 2017-06-04 ENCOUNTER — Encounter (HOSPITAL_COMMUNITY): Payer: Self-pay

## 2017-06-04 ENCOUNTER — Encounter (HOSPITAL_COMMUNITY): Payer: Medicare Other | Attending: Hematology & Oncology

## 2017-06-04 VITALS — BP 114/80 | HR 107 | Temp 98.5°F | Resp 18

## 2017-06-04 DIAGNOSIS — C182 Malignant neoplasm of ascending colon: Secondary | ICD-10-CM | POA: Diagnosis not present

## 2017-06-04 DIAGNOSIS — D500 Iron deficiency anemia secondary to blood loss (chronic): Secondary | ICD-10-CM | POA: Insufficient documentation

## 2017-06-04 DIAGNOSIS — Z79899 Other long term (current) drug therapy: Secondary | ICD-10-CM | POA: Insufficient documentation

## 2017-06-04 DIAGNOSIS — Z87891 Personal history of nicotine dependence: Secondary | ICD-10-CM | POA: Insufficient documentation

## 2017-06-04 DIAGNOSIS — Z452 Encounter for adjustment and management of vascular access device: Secondary | ICD-10-CM

## 2017-06-04 MED ORDER — HEPARIN SOD (PORK) LOCK FLUSH 100 UNIT/ML IV SOLN
500.0000 [IU] | Freq: Once | INTRAVENOUS | Status: AC | PRN
  Administered 2017-06-04: 500 [IU]

## 2017-06-04 MED ORDER — SODIUM CHLORIDE 0.9% FLUSH
10.0000 mL | INTRAVENOUS | Status: DC | PRN
  Administered 2017-06-04: 10 mL
  Filled 2017-06-04: qty 10

## 2017-06-04 NOTE — Progress Notes (Signed)
Janet Hanson returns today for port de access and flush after 46 hr continous infusion of 62fu. Tolerated infusion without problems. Portacath located right chest wall was flushed with 78ml NS and 500U/33ml Heparin, deaccessed and needle removed intact.  Procedure without incident. Patient tolerated procedure well. Patient discharged ambulatory and in stable condition from clinic to self. Follow up as scheduled.

## 2017-06-04 NOTE — Patient Instructions (Signed)
[place] at [facility] Discharge Instructions  RECOMMENDATIONS MADE BY THE CONSULTANT AND ANY TEST RESULTS WILL BE SENT TO YOUR REFERRING PHYSICIAN.  You had your home infusion pump removed today. Follow up as scheduled.  Thank you for choosing [place] at [facility] to provide your oncology and hematology care.  To afford each patient quality time with our provider, please arrive at least 15 minutes before your scheduled appointment time.    If you have a lab appointment with the [facility] please come in thru the  Main Entrance and check in at the main information desk  You need to re-schedule your appointment should you arrive 10 or more minutes late.  We strive to give you quality time with our providers, and arriving late affects you and other patients whose appointments are after yours.  Also, if you no show three or more times for appointments you may be dismissed from the clinic at the providers discretion.     Again, thank you for choosing [facility].  Our hope is that these requests will decrease the amount of time that you wait before being seen by our physicians.       _____________________________________________________________  Should you have questions after your visit to [facility], please contact our office at [phone] between the hours of 8:30 a.m. and 4:30 p.m.  Voicemails left after 4:30 p.m. will not be returned until the following business day.  For prescription refill requests, have your pharmacy contact our office.       Resources For Cancer Patients and their Caregivers ? American Cancer Society: Can assist with transportation, wigs, general needs, runs Look Good Feel Better.        225 597 4167 ? Cancer Care: Provides financial assistance, online support groups, medication/co-pay assistance.  1-800-813-HOPE 867-809-0655) ? [place] Assists [place] cancer patients and their families through emotional , educational and financial support.  639 124 7241 ? [place] DSS Where to apply for food stamps, Medicaid and utility assistance. (551)106-2809 ? [facility]: Transportation to medical appointments. 217-355-7418 ? Social Security Administration: May apply for disability if have a Stage IV cancer. 207 054 0256 (251) 137-4788 ? [employer], Disability and Transit Services: Assists with nutrition, care and transit needs. [place] Support Programs: @10RELATIVEDAYS @ > Cancer Support Group  2nd Tuesday of the month 1pm-2pm, Journey Room  > Creative Journey  3rd Tuesday of the month 1130am-1pm, Journey Room  > Look Good Feel Better  1st Wednesday of the month 10am-12 noon, Journey Room (Call [place] to register [phone])

## 2017-06-15 ENCOUNTER — Emergency Department (HOSPITAL_COMMUNITY)
Admission: EM | Admit: 2017-06-15 | Discharge: 2017-06-15 | Disposition: A | Discharge status: Home / Self Care | Payer: Medicare Other | Attending: Emergency Medicine | Admitting: Emergency Medicine

## 2017-06-15 ENCOUNTER — Other Ambulatory Visit (HOSPITAL_COMMUNITY): Payer: Self-pay | Admitting: Oncology

## 2017-06-15 ENCOUNTER — Other Ambulatory Visit: Payer: Self-pay

## 2017-06-15 ENCOUNTER — Encounter (HOSPITAL_COMMUNITY): Payer: Self-pay

## 2017-06-15 DIAGNOSIS — Z7901 Long term (current) use of anticoagulants: Secondary | ICD-10-CM | POA: Insufficient documentation

## 2017-06-15 DIAGNOSIS — I10 Essential (primary) hypertension: Secondary | ICD-10-CM | POA: Diagnosis not present

## 2017-06-15 DIAGNOSIS — R1084 Generalized abdominal pain: Secondary | ICD-10-CM | POA: Insufficient documentation

## 2017-06-15 DIAGNOSIS — Z85038 Personal history of other malignant neoplasm of large intestine: Secondary | ICD-10-CM | POA: Insufficient documentation

## 2017-06-15 DIAGNOSIS — Z87891 Personal history of nicotine dependence: Secondary | ICD-10-CM | POA: Diagnosis not present

## 2017-06-15 DIAGNOSIS — Z79899 Other long term (current) drug therapy: Secondary | ICD-10-CM | POA: Insufficient documentation

## 2017-06-15 DIAGNOSIS — R109 Unspecified abdominal pain: Secondary | ICD-10-CM

## 2017-06-15 LAB — URINALYSIS, ROUTINE W REFLEX MICROSCOPIC
BACTERIA UA: NONE SEEN
Bilirubin Urine: NEGATIVE
GLUCOSE, UA: NEGATIVE mg/dL
HGB URINE DIPSTICK: NEGATIVE
KETONES UR: NEGATIVE mg/dL
NITRITE: NEGATIVE
PH: 5 (ref 5.0–8.0)
PROTEIN: 30 mg/dL — AB
Specific Gravity, Urine: 1.021 (ref 1.005–1.030)

## 2017-06-15 LAB — CBC WITH DIFFERENTIAL/PLATELET
BASOS ABS: 0 10*3/uL (ref 0.0–0.1)
BASOS PCT: 1 %
EOS ABS: 0 10*3/uL (ref 0.0–0.7)
EOS PCT: 1 %
HCT: 31.9 % — ABNORMAL LOW (ref 36.0–46.0)
Hemoglobin: 10.5 g/dL — ABNORMAL LOW (ref 12.0–15.0)
Lymphocytes Relative: 46 %
Lymphs Abs: 1.1 10*3/uL (ref 0.7–4.0)
MCH: 34.3 pg — ABNORMAL HIGH (ref 26.0–34.0)
MCHC: 32.9 g/dL (ref 30.0–36.0)
MCV: 104.2 fL — ABNORMAL HIGH (ref 78.0–100.0)
MONO ABS: 0.5 10*3/uL (ref 0.1–1.0)
MONOS PCT: 23 %
NEUTROS ABS: 0.7 10*3/uL — AB (ref 1.7–7.7)
Neutrophils Relative %: 29 %
PLATELETS: 127 10*3/uL — AB (ref 150–400)
RBC: 3.06 MIL/uL — ABNORMAL LOW (ref 3.87–5.11)
RDW: 16.5 % — AB (ref 11.5–15.5)
WBC: 2.3 10*3/uL — ABNORMAL LOW (ref 4.0–10.5)

## 2017-06-15 LAB — COMPREHENSIVE METABOLIC PANEL
ALBUMIN: 3 g/dL — AB (ref 3.5–5.0)
ALK PHOS: 52 U/L (ref 38–126)
ALT: 16 U/L (ref 14–54)
ANION GAP: 10 (ref 5–15)
AST: 28 U/L (ref 15–41)
BILIRUBIN TOTAL: 0.8 mg/dL (ref 0.3–1.2)
BUN: 13 mg/dL (ref 6–20)
CALCIUM: 8 mg/dL — AB (ref 8.9–10.3)
CO2: 27 mmol/L (ref 22–32)
CREATININE: 0.68 mg/dL (ref 0.44–1.00)
Chloride: 103 mmol/L (ref 101–111)
GFR calc Af Amer: >60 mL/min (ref >60)
GFR calc non Af Amer: >60 mL/min (ref >60)
GLUCOSE: 109 mg/dL — AB (ref 65–99)
Potassium: 2.8 mmol/L — ABNORMAL LOW (ref 3.5–5.1)
Sodium: 140 mmol/L (ref 135–145)
Total Protein: 5.7 g/dL — ABNORMAL LOW (ref 6.5–8.1)

## 2017-06-15 LAB — I-STAT CG4 LACTIC ACID, ED: Lactic Acid, Venous: 1.4 mmol/L (ref 0.5–1.9)

## 2017-06-15 MED ORDER — SODIUM CHLORIDE 0.9 % IV BOLUS (SEPSIS)
1000.0000 mL | Freq: Once | INTRAVENOUS | Status: AC
  Administered 2017-06-15: 1000 mL via INTRAVENOUS

## 2017-06-15 MED ORDER — FENTANYL CITRATE (PF) 100 MCG/2ML IJ SOLN
25.0000 ug | Freq: Once | INTRAMUSCULAR | Status: AC
  Administered 2017-06-15: 25 ug via INTRAVENOUS

## 2017-06-15 MED ORDER — FENTANYL CITRATE (PF) 100 MCG/2ML IJ SOLN
50.0000 ug | Freq: Once | INTRAMUSCULAR | Status: DC
  Filled 2017-06-15: qty 2

## 2017-06-15 NOTE — ED Notes (Signed)
Pt wheeled to waiting room. Pt verbalized understanding of discharge instructions.   

## 2017-06-15 NOTE — ED Triage Notes (Signed)
Reports of right side abdominal pain that radiates to back since Wednesday. States she recently pulled a muscle and has been taking Flexeril and tylenol. No fever. 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

## 2017-06-15 NOTE — ED Provider Notes (Signed)
[facility] DEPT Provider Note   CSN: [id number] Arrival date & time: 06/15/17  1510     History   Chief Complaint Chief Complaint  Patient presents with  . Abdominal Pain    HPI AXIE HAYNE is a 71 y.o. female.  71 year old female history of right colon cancer and hyperlipidemia long with hypertension presents emergency department today with right-sided pain. She states that she was making her bed and she bent over and rested up too quick and headache "catch" in her right back. States her last couple days as aggressively travels around to have an right abdominal pain similar. Worse with movement. States she does have a history of colon cancer however it's been stable and improving. No known bone metastasis or spinal metastasis. She says is feeling multiple episodes of muscular skeletal pain in the past. No urinary symptoms, GI symptoms or other symptoms that are new. Sees oncologist tomorrow for further chemo.       Past Medical History:  Diagnosis Date  . Cancer of right colon ([place]) 07/30/2016  . Colon cancer ([place])   . Depression   . Hypercholesterolemia   . Hypertension   . Iron deficiency anemia due to chronic blood loss 07/30/2016  . Vitamin B12 deficiency 08/13/2016    Patient Active Problem List   Diagnosis Date Noted  . Vitamin B12 deficiency 08/13/2016  . Elevated CEA 07/31/2016  . Cancer of right colon ([place]) 07/30/2016  . Iron deficiency anemia due to chronic blood loss 07/30/2016  . Acute pulmonary embolism ([place]) 07/01/2016  . Colonic mass 06/24/2016  . Anxiety 06/24/2016  . HTN (hypertension) 06/24/2016    Past Surgical History:  Procedure Laterality Date  . COLONOSCOPY N/A 11/05/2016   Procedure: COLONOSCOPY;  Surgeon: Rogene Houston, MD;  Location: [facility] ENDO SUITE;  Service: Endoscopy;  Laterality: N/A;  1:20  . PARTIAL COLECTOMY N/A 06/27/2016   Procedure: PARTIAL COLECTOMY;  Surgeon: Vickie Epley, MD;  Location: [facility] ORS;  Service: General;   Laterality: N/A;  . PORTACATH PLACEMENT Right 02/09/2017   Procedure: INSERTION PORT-A-CATH;  Surgeon: Aviva Signs, MD;  Location: [facility] ORS;  Service: General;  Laterality: Right;  right subclavian    OB History    No data available       Home Medications    Prior to Admission medications   Medication Sig Start Date End Date Taking? Authorizing Provider  acetaminophen (TYLENOL) 325 MG tablet Take 325 mg by mouth every 6 (six) hours as needed for mild pain or moderate pain.   Yes [provider]  amLODipine (NORVASC) 5 MG tablet Take 5 mg by mouth daily.   Yes [provider]  Ascorbic Acid (VITAMIN C) 1000 MG tablet Take 1,000 mg by mouth daily.   Yes [provider]  busPIRone (BUSPAR) 15 MG tablet Take 15 mg by mouth 2 (two) times daily.  04/29/17  Yes [provider]  cyclobenzaprine (FLEXERIL) 10 MG tablet Take 10 mg by mouth 3 (three) times daily as needed for muscle spasms.   Yes [provider]  dexamethasone (DECADRON) 4 MG tablet Take 2 tablets (8 mg total) by mouth daily. Start the day after chemotherapy for 2 days. Take with food. 02/03/17  Yes Brunetta Genera, MD  escitalopram (LEXAPRO) 20 MG tablet Take 20 mg by mouth daily.    Yes [provider]  fluorouracil CALGB 93716 in sodium chloride 0.9 % 150 mL Inject into the vein over 48 hr.   Yes [provider]  HYDROcodone-acetaminophen (NORCO) 5-325 MG tablet Take 1 tablet by mouth every 6 (six) hours as needed for moderate pain. 02/09/17  Yes Aviva Signs, MD  LEUCOVORIN CALCIUM IV Inject into the vein. Every 2 weeks   Yes [provider]  lidocaine-prilocaine (EMLA) cream Apply to affected area once 02/03/17  Yes Brunetta Genera, MD  Loperamide HCl (IMODIUM PO) Take 1 tablet by mouth daily as needed (for diarrhea).    Yes [provider]  metFORMIN (GLUCOPHAGE) 500 MG tablet Take 500 mg by mouth daily with breakfast.  05/30/17  Yes [provider]  Omega-3 Fatty Acids (FISH OIL) 1200 MG CAPS Take 1,200 capsules by mouth daily.    Yes [provider]  ondansetron (ZOFRAN) 8 MG tablet Take 1 tablet (8 mg total) by mouth 2 (two) times daily as needed for refractory nausea / vomiting. Start on day 3 after chemotherapy. 02/03/17  Yes Brunetta Genera, MD  OXALIPLATIN IV Inject into the vein. Every 2 weeks   Yes [provider]  prochlorperazine (COMPAZINE) 10 MG tablet Take 1 tablet (10 mg total) by mouth every 6 (six) hours as needed (Nausea or vomiting). 02/03/17  Yes Brunetta Genera, MD  simvastatin (ZOCOR) 40 MG tablet Take 40 mg by mouth daily.   Yes [provider]  warfarin (COUMADIN) 1 MG tablet Take 1-5 mg by mouth See admin instructions. Takes 1 mg daily except 2.5 mg on Sundays and Thursdays.   Yes [provider]  warfarin (COUMADIN) 5 MG tablet Take 1-5 mg by mouth See admin instructions. Takes 1 mg daily except 2.5 mg on Sundays and Thursdays. 12/22/16  Yes [provider]    Family History Family History  Problem Relation Age of Onset  . Hypertension Mother   . Other Father        some type of "stomach ailment"  . Suicidality Brother   . Stomach cancer Maternal Aunt   . Brain cancer Brother     Social History Social History  Substance Use Topics  . Smoking status: Former Smoker    Packs/day: 0.50    Years: 48.00    Types: Cigarettes    Quit date: 06/24/2016  . Smokeless tobacco: Never Used     Comment: quit smoking Sept 2017  . Alcohol use No     Comment: glass wine  twice per year     Allergies   Patient has no known allergies.   Review of Systems Review of Systems  All other systems reviewed and are negative.    Physical Exam Updated Vital Signs BP 119/81 (BP Location: Right Arm)   Pulse 98   Temp 99.9 F (37.7 C) (Rectal)   Resp 17   Ht 5\' 2"  (1.575 m)   Wt 76.7 kg (169 lb)   SpO2 97%   BMI 30.91 kg/m   Physical Exam    Constitutional: She is oriented to person, place, and time. She appears well-developed and well-nourished.  HENT:  Head: Normocephalic and atraumatic.  Eyes: Conjunctivae and EOM are normal.  Neck: Normal range of motion.  Cardiovascular: Normal rate and regular rhythm.   Pulmonary/Chest: Effort normal and breath sounds normal. No stridor. No respiratory distress.  Abdominal: Soft. She exhibits no distension. There is no tenderness. There is no guarding.  Musculoskeletal: Normal range of motion. She exhibits tenderness (mild right flank). She exhibits no edema or deformity.  Neurological: She is alert and oriented to person, place, and time.  No cranial nerve deficit.  Skin: Skin is warm and dry.  Nursing note and vitals reviewed.    ED Treatments / Results  Labs (all labs ordered are listed, but only abnormal results are displayed) Labs Reviewed  COMPREHENSIVE METABOLIC PANEL - Abnormal; Notable for the following:       Result Value   Potassium 2.8 (*)    Glucose, Bld 109 (*)    Calcium 8.0 (*)    Total Protein 5.7 (*)    Albumin 3.0 (*)    All other components within normal limits  CBC WITH DIFFERENTIAL/PLATELET - Abnormal; Notable for the following:    WBC 2.3 (*)    RBC 3.06 (*)    Hemoglobin 10.5 (*)    HCT 31.9 (*)    MCV 104.2 (*)    MCH 34.3 (*)    RDW 16.5 (*)    Platelets 127 (*)    Neutro Abs 0.7 (*)    All other components within normal limits  URINALYSIS, ROUTINE W REFLEX MICROSCOPIC - Abnormal; Notable for the following:    APPearance HAZY (*)    Protein, ur 30 (*)    Leukocytes, UA SMALL (*)    Squamous Epithelial / LPF 6-30 (*)    All other components within normal limits  CULTURE, BLOOD (ROUTINE X 2)  CULTURE, BLOOD (ROUTINE X 2)  I-STAT CG4 LACTIC ACID, ED  I-STAT CG4 LACTIC ACID, ED    EKG  EKG Interpretation None       Radiology No results found.  Procedures Procedures (including critical care time)  Medications Ordered in  ED Medications  sodium chloride 0.9 % bolus 1,000 mL (0 mLs Intravenous Stopped 06/15/17 1806)  sodium chloride 0.9 % bolus 1,000 mL (0 mLs Intravenous Stopped 06/15/17 1914)  fentaNYL (SUBLIMAZE) injection 25 mcg (25 mcg Intravenous Given 06/15/17 1817)     Initial Impression / Assessment and Plan / ED Course  I have reviewed the triage vital signs and the nursing notes.  Pertinent labs & imaging results that were available during my care of the patient were reviewed by me and considered in my medical decision making (see chart for details).     Discussed GI versus musculoskeletal causes for her pain and patient felt like is more muscular. Improved significantly with pain medicine here and pain medicine at home already. We'll defer on further imaging this time is at low suspicion for bony metastasis however does not improve in 2-3 days as expected she will discuss with her primary doctor or oncologist about repeat CT scan.  Final Clinical Impressions(s) / ED Diagnoses   Final diagnoses:  Abdominal pain, unspecified abdominal location    New Prescriptions Discharge Medication List as of 06/15/2017  7:14 PM       Timara Loma, Corene Cornea, MD 06/15/17 2117

## 2017-06-16 ENCOUNTER — Ambulatory Visit (HOSPITAL_COMMUNITY): Payer: Medicare Other

## 2017-06-18 ENCOUNTER — Encounter (HOSPITAL_COMMUNITY): Payer: Medicare Other

## 2017-06-20 LAB — CULTURE, BLOOD (ROUTINE X 2)
CULTURE: NO GROWTH
Culture: NO GROWTH
SPECIAL REQUESTS: ADEQUATE
SPECIAL REQUESTS: ADEQUATE

## 2017-06-24 ENCOUNTER — Ambulatory Visit (HOSPITAL_COMMUNITY): Payer: Medicare Other

## 2017-06-25 ENCOUNTER — Other Ambulatory Visit (HOSPITAL_COMMUNITY): Payer: Self-pay | Admitting: Oncology

## 2017-06-29 NOTE — Progress Notes (Addendum)
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 There is a small adjacent foci of gas and free fluid raising the possibility of micro perforation. There is marked dilatation of the cecum and small bowel with fluid and gas most compatible with proximal obstruction caused by the large ascending colonic mass. Additionally there is a 6 cm fatty mass involving the colon at the level the hepatic flexure, most suggestive of a lipoma. Aortic vascular calcifications. Sigmoid colonic diverticulosis without evidence for acute diverticulitis.No specific findings identified to suggest metastatic disease to the chest.There is a 3 mm nodule identified within the left upper lobe, nonspecific.Bilateral pleural effusions with overlying compressive type atelectasis and consolidation       06/26/2016 Tumor Marker    CEA 39.3 ng/ml      06/27/2016 Surgery    Partial colectomy with primary linear stapled anastomosis.  Large bulky mid-ascending colonic mass extending into the colonic mesentery and retroperitoneum with a layer of not visibly/palpably involved fat between the colon and underlying visualized and exposed duodenum and Right kidney      06/27/2016 Pathology Results    COLONIC ADENOCARCINOMA (8.5 CM), GRADE 2 THE TUMOR INVADES THROUGH THE MUSCULARIS PROPRIA INTO PERICOLONIC TISSUE  ALL MARGINS OF RESECTION ARE NEGATIVE FOR CARCINOMA FOURTEEN BENIGN LYMPH NODES (0/14) TUBULAR ADENOMA  (X3) UNREMARKABLE APPENDIX      06/30/2016 Imaging    CT angio chest Positive examination for right upper lobe and right lower lobe pulmonary emboli. No evidence of right heart strain. Again demonstrated are bilateral pleural effusions and basilar atelectasis, greater on the right.      07/30/2016 Tumor Marker      Ref. Range 07/30/2016 16:46  CEA Latest Ref Range: 0.0 - 4.7 ng/mL 4.0        07/30/2016 Miscellaneous    Medical Oncology consultation.  No adjuvant chemotherapy needed based upon risk factors and small benefit.  Informed decision made by patient to not pursue systemic chemotherapy in adjuvant setting.      11/05/2016 Procedure    Colonoscopy by Dr. Laural Golden.  soft anal tags found on digital rectal exam. - Patent end-to-side colo-colonic anastomosis, characterized by healthy appearing mucosa. - Three 5 to 7 mm polyps in the proximal transverse colon, removed with a cold snare. Resected and retrieved. - One 10 mm polyp in the proximal transverse colon, removed with a hot snare. Resected and retrieved. - One 20 mm polyp in the proximal sigmoid colon, removed with a hot snare. Resected and retrieved. Clip (MR conditional) was placed. - One 15 to 25 mm polyp at the recto-sigmoid colon, removed with a hot snare. Resected and retrieved. Clip (MR conditional) was placed. - Diverticulosis in the sigmoid colon and in the descending colon. - The distal rectum and anal verge are normal on retroflexion view.      11/07/2016 Pathology Results    1. Colon, polyp(s), transverse proximal - TUBULAR ADENOMA(S). - HIGH GRADE DYSPLASIA IS NOT IDENTIFIED. 2. Colon, polyp(s), proximal  sigmoid - TUBULOVILLOUS ADENOMA WITH FOCAL HIGH GRADE DYSPLASIA (5%). 3. Rectosigmoid , polyp - TUBULOVILLOUS ADENOMA WITH HIGH GRADE DYSPLASIA (15%).      12/26/2016 Imaging    CT abd/pelvis-1. Interval development of a 2.4 cm rim enhancing lesion in the lateral segment left liver, highly suspicious for  metastatic disease. 2. New abnormal irregular soft tissue in the cul-de-sac, involving the anterior wall of the rectum and posterior cervix. This is highly suspicious for metastatic involvement (drop lesion). 3. Interval Right hemicolectomy. 4.  Abdominal Aortic Atherosclerois (ICD10-170.0)      01/16/2017 Procedure    IR biopsy of hepatic lesion      01/19/2017 Pathology Results    Liver, needle/core biopsy, Left Lobe - ADENOCARCINOMA Microscopic Comment The morphology is consistent with metastatic colorectal adenocarcinoma. There is sufficient tissue for additional studies. Called to [facility] on 01/19/17      02/06/2017 Imaging    CT chest- 1. Stable CT chest. No substantial change in the 7 mm short axis left paraesophageal lymph node. 2. Rim enhancing lesion lateral segment left liver has progressed in the interval      02/09/2017 Procedure    Port-A-Cath insertion by Dr. Arnoldo Morale      02/10/2017 -  Chemotherapy    The patient had palonosetron (ALOXI) injection 0.25 mg, 0.25 mg, Intravenous,  Once, 1 of 4 cycles  leucovorin 800 mg in dextrose 5 % 250 mL infusion, 404 mg/m2 = 792 mg, Intravenous,  Once, 1 of 4 cycles  oxaliplatin (ELOXATIN) 170 mg in dextrose 5 % 500 mL chemo infusion, 85 mg/m2 = 170 mg, Intravenous,  Once, 1 of 4 cycles  fluorouracil (ADRUCIL) chemo injection 800 mg, 400 mg/m2 = 800 mg, Intravenous,  Once, 1 of 4 cycles  fluorouracil (ADRUCIL) 4,750 mg in sodium chloride 0.9 % 55 mL chemo infusion, 2,400 mg/m2 = 4,750 mg, Intravenous, 1 Day/Dose, 1 of 4 cycles  for chemotherapy treatment.        03/11/2017 Pathology Results    FoundationONE- KRAS wildtype, NRAS wildtype, MS stable, TMB 6 Muts/Mb, APC I680*, APC H212*, TERT promoter-124C>T, TP53 LOSS.  Therapies with clinical benefit include cetuximab, Panitumumab.      05/01/2017 Imaging    CT C/A/P: 1. The left hepatic metastasis is smaller in the interval. The soft tissue between the anterior wall of  the rectum and the posterior uterus is concerning for a drop metastasis, similar in the interval. No other metastatic disease is identified. 2. The ascending thoracic aorta measures 4.1 cm. Ascending thoracic aortic aneurysm. Recommend semi-annual imaging followup by CTA or MRA and referral to cardiothoracic surgery if not already obtained. This recommendation follows 2010 ACCF/AHA/AATS/ACR/ASA/SCA/SCAI/SIR/STS/SVM Guidelines for the Diagnosis and Management of Patients With Thoracic Aortic Disease. Circulation. 2010; 121: Y482-N003 3. Atherosclerosis in the aorta. 4. Coronary artery calcifications.        HISTORY OF PRESENT ILLNESS:  (From Dr. Laverle Patter last note on 06/02/17)  Janet Hanson 71 y.o. female is here for follow up of stage IV, T3 N0 M1 ascending colonic adenocarcinoma with liver metastasis.  Avastin was added to FOLFOX starting with cycle 3 of chemo due to evidence of progression due to enlarging liver met.      INTERVAL HISTORY:  Janet Hanson 71 y.o. female returns for routine follow-up for metastatic colon cancer.   Due for cycle #10 FOLFOX/Avastin today.    Chart reviewed; since her last visit to the cancer center, she had ED visit on 06/15/17 for (R)-sided abd pain;  thought to be musculoskeletal in nature and she was discharged home after receiving pain medications.    She was due to have cycle #10 on 06/16/17, but she had a fall at home and hit her head that day; was reportedly taking her Coumadin as directed at that time. Prior to the fall, she took some muscle relaxants, was going to answer the phone, and subsequently fell and hit her head behind her (R) ear. She was reportedly evaluated in [place] and had scans of her head and chest per patient's daughter.  She was admitted in [place] for a few days and discharged home.    Since her fall and hospitalization, patient reports changes in her mental status. She stopped taking her medications "and I have no idea  why I did that! They were out on my counter for me to see and I just didn't think to take them."  She's had intermittent nausea with occasional vomiting since that time as well.  Denies any headaches at present, but did have headache after her fall.   Reports "I feel like I lost a few days after I fell and I don't know what happened during that time."  Also states that she has felt very depressed and hopeless lately; denies suicidal ideation. Reports that she is crying a lot and hates feeling sick.   BP is elevated 150s/100s and HR 120s today.  Afebrile. Denies any urinary symptoms, cough, or shortness of breath.  Last Coumadin dose about 2 weeks ago per patient.   Last chemo was 06/02/17 with 5-FU/leucovorin, Oxaliplatin, and Avastin.  Her weight is down ~20 lbs in the past 2 months.     REVIEW OF SYSTEMS:  Review of Systems - Oncology (per HPI)   PAST MEDICAL/SURGICAL HISTORY:  Past Medical History:  Diagnosis Date  . Cancer of right colon ([place]) 07/30/2016  . Colon cancer ([place])   . Depression   . Hypercholesterolemia   . Hypertension   . Iron deficiency anemia due to chronic blood loss 07/30/2016  . Vitamin B12 deficiency 08/13/2016   Past Surgical History:  Procedure Laterality Date  . COLONOSCOPY N/A 11/05/2016   Procedure: COLONOSCOPY;  Surgeon: Rogene Houston, MD;  Location: [facility];  Service: Endoscopy;  Laterality: N/A;  1:20  . PARTIAL COLECTOMY N/A 06/27/2016   Procedure: PARTIAL COLECTOMY;  Surgeon: Vickie Epley, MD;  Location: [facility] ORS;  Service: General;  Laterality: N/A;  . PORTACATH PLACEMENT Right 02/09/2017   Procedure: INSERTION PORT-A-CATH;  Surgeon: Aviva Signs, MD;  Location: [facility] ORS;  Service: General;  Laterality: Right;  right subclavian     SOCIAL HISTORY:  Social History   Social History  . Marital status: Widowed    Spouse name: N/A  . Number of children: N/A  . Years of education: N/A   Occupational History  . Not on file.   Social History  Main Topics  . Smoking status: Former Smoker    Packs/day: 0.50    Years: 48.00    Types: Cigarettes    Quit date: 06/24/2016  . Smokeless tobacco: Never Used     Comment: quit smoking Sept 2017  . Alcohol use No     Comment: glass wine  twice per year  . Drug use: No  . Sexual activity: No     Comment: widowed- 1 daughter   Other Topics Concern  . Not on file   Social History Narrative  . No narrative on file    FAMILY HISTORY:  Family History  Problem Relation Age of Onset  . Hypertension Mother   . Other Father        some type of "stomach ailment"  . Suicidality Brother   . Stomach cancer Maternal Aunt   . Brain cancer Brother     CURRENT MEDICATIONS:  Facility-Administered Encounter Medications as of 06/30/2017  Medication  . [COMPLETED] ondansetron (ZOFRAN) injection 4 mg  . [COMPLETED] ondansetron (ZOFRAN) injection 4 mg  . sodium chloride flush (NS) 0.9 % injection 10 mL  . [DISCONTINUED] ondansetron (ZOFRAN) 8 mg in sodium chloride 0.9 % 50 mL IVPB   Outpatient Encounter Prescriptions as of 06/30/2017  Medication Sig  . acetaminophen (TYLENOL) 325 MG tablet Take 325 mg by mouth every 6 (six) hours as needed for mild pain or moderate pain.  Marland Kitchen amLODipine (NORVASC) 5 MG tablet Take 5 mg by mouth daily.  . Ascorbic Acid (VITAMIN C) 1000 MG tablet Take 1,000 mg by mouth daily.  . busPIRone (BUSPAR) 15 MG tablet Take 15 mg by mouth 2 (two) times daily.   . carvedilol (COREG) 3.125 MG tablet   . cyclobenzaprine (FLEXERIL) 10 MG tablet Take 10 mg by mouth 3 (three) times daily as needed for muscle spasms.  Marland Kitchen dexamethasone (DECADRON) 4 MG tablet Take 2 tablets (8 mg total) by mouth daily. Start the day after chemotherapy for 2 days. Take with food. (Patient not taking: Reported on 06/30/2017)  . escitalopram (LEXAPRO) 20 MG tablet Take 20 mg by mouth daily.   . fluorouracil CALGB 59935 in sodium chloride 0.9 % 150 mL Inject into the vein over 48 hr.  .  HYDROcodone-acetaminophen (NORCO) 5-325 MG tablet Take 1 tablet by mouth every 6 (six) hours as needed for moderate pain.  Marland Kitchen LEUCOVORIN CALCIUM IV Inject into the vein. Every 2 weeks  . lidocaine-prilocaine (EMLA) cream Apply to affected area once (Patient not taking: Reported on 06/30/2017)  . Loperamide HCl (IMODIUM PO) Take 1 tablet by mouth daily as needed (for diarrhea).   . metFORMIN (GLUCOPHAGE) 500 MG tablet Take 500 mg by mouth daily with breakfast.   . Omega-3 Fatty Acids (FISH OIL) 1200 MG CAPS Take 1,200 capsules by mouth daily.   . ondansetron (ZOFRAN) 8 MG tablet Take 1 tablet (8 mg total) by mouth 2 (two) times daily as needed for refractory nausea / vomiting. Start on day 3 after chemotherapy. (Patient not taking: Reported on 06/30/2017)  . OXALIPLATIN IV Inject into the vein. Every 2 weeks  . prochlorperazine (COMPAZINE) 10 MG tablet Take 1 tablet (10 mg total) by mouth every 6 (six) hours as needed (Nausea or vomiting).  . simvastatin (ZOCOR) 40 MG tablet Take 40 mg by mouth daily.  Marland Kitchen warfarin (COUMADIN) 1 MG tablet Take 1-5 mg by mouth See admin instructions. Takes 1 mg daily except 2.5 mg on Sundays and Thursdays.  Marland Kitchen warfarin (COUMADIN) 5 MG tablet Take 1-5 mg by mouth See admin instructions. Takes 1 mg daily except 2.5 mg on Sundays and Thursdays.    ALLERGIES:  No Known Allergies   PHYSICAL EXAM:  ECOG Performance status: 2-3 - Symptomatic; requires assistance     Physical Exam  HENT:  Head:    Eyes: Conjunctivae are normal. No scleral icterus.  Neck: Normal range of motion. Neck supple.  Cardiovascular:  Tachycardic  Pulmonary/Chest: Effort normal and breath sounds normal. No respiratory distress. She has no wheezes.  Abdominal: Soft. Bowel sounds are normal. There is no tenderness.  Musculoskeletal: Normal range of  motion.  Lymphadenopathy:    She has no cervical adenopathy.  Neurological: She is alert.  Skin: Skin is warm and dry. No rash noted.    Psychiatric: She exhibits a depressed mood.  Nursing note and vitals reviewed.    LABORATORY DATA:  I have reviewed the labs as listed.  CBC    Component Value Date/Time   WBC 11.1 (H) 06/30/2017 0859   RBC 3.30 (L) 06/30/2017 0859   HGB 11.1 (L) 06/30/2017 0859   HCT 35.3 (L) 06/30/2017 0859   PLT 227 06/30/2017 0859   MCV 107.0 (H) 06/30/2017 0859   MCH 33.6 06/30/2017 0859   MCHC 31.4 06/30/2017 0859   RDW 16.3 (H) 06/30/2017 0859   LYMPHSABS 1.9 06/30/2017 0859   MONOABS 1.3 (H) 06/30/2017 0859   EOSABS 0.1 06/30/2017 0859   BASOSABS 0.0 06/30/2017 0859   CMP Latest Ref Rng & Units 06/30/2017 06/15/2017 06/02/2017  Glucose 65 - 99 mg/dL 152(H) 109(H) 142(H)  BUN 6 - 20 mg/dL 13 13 12   Creatinine 0.44 - 1.00 mg/dL 0.76 0.68 0.97  Sodium 135 - 145 mmol/L 139 140 141  Potassium 3.5 - 5.1 mmol/L 3.1(L) 2.8(L) 3.0(L)  Chloride 101 - 111 mmol/L 103 103 104  CO2 22 - 32 mmol/L 23 27 26   Calcium 8.9 - 10.3 mg/dL 8.9 8.0(L) 7.9(L)  Total Protein 6.5 - 8.1 g/dL 6.9 5.7(L) 5.9(L)  Total Bilirubin 0.3 - 1.2 mg/dL 0.7 0.8 0.6  Alkaline Phos 38 - 126 U/L 111 52 50  AST 15 - 41 U/L 29 28 29   ALT 14 - 54 U/L 19 16 17     PENDING LABS:    DIAGNOSTIC IMAGING:  *The following radiologic images and reports have been reviewed independently and agree with below findings.  CT chest/abd/pelvis: 05/01/17 CLINICAL DATA:  Re- stage colon cancer  EXAM: CT CHEST, ABDOMEN, AND PELVIS WITH CONTRAST  TECHNIQUE: Multidetector CT imaging of the chest, abdomen and pelvis was performed following the standard protocol during bolus administration of intravenous contrast.  CONTRAST:  151m ISOVUE-300 IOPAMIDOL (ISOVUE-300) INJECTION 61%  COMPARISON:  CT the abdomen and pelvis December 26, 2016  FINDINGS: CT CHEST FINDINGS  Cardiovascular: Coronary artery calcifications are seen. The ascending thoracic aorta measures 4.1 cm in AP dimension on series 2, image 25. Mild atherosclerotic  changes are seen in the aorta. No dissection. The central pulmonary arteries are normal. No central filling defects on limited images.  Mediastinum/Nodes: No enlarged mediastinal, hilar, or axillary lymph nodes. Thyroid gland, trachea, and esophagus demonstrate no significant findings.  Lungs/Pleura: The central airways are normal no pneumothorax mild atelectasis in the lingula. No suspicious pulmonary nodules or masses. No focal infiltrates.  Musculoskeletal: See below  CT ABDOMEN PELVIS FINDINGS  Hepatobiliary: The metastatic lesion measures 15.3 mm today versus 23.8 mm previously. Focal fatty deposition adjacent to the falciform ligament is identified. No other focal liver lesions are seen. Hepatic steatosis is noted. The portal vein is patent. The gallbladder is normal.  Pancreas: Unremarkable. No pancreatic ductal dilatation or surrounding inflammatory changes.  Spleen: Normal in size without focal abnormality.  Adrenals/Urinary Tract: A few scattered cysts are seen in the right kidney. The adrenal glands and kidneys are otherwise normal. The ureters and bladder are normal.  Stomach/Bowel: The stomach and small bowel are normal. Colonic diverticulosis is seen without diverticulitis. The patient is status post partial colectomy. Remainder of the colon is otherwise normal.  Vascular/Lymphatic: Atherosclerosis is seen in the non aneurysmal aorta. No suspicious adenopathy.  Reproductive: Uterus and bilateral adnexa are unremarkable.  Other: No free air free fluid. The soft tissue between the anterior rectum and the posterior uterus is again identified and suspicious for a drop metastasis as described on the previous study.  Musculoskeletal: No acute or significant osseous findings.  IMPRESSION: 1. The left hepatic metastasis is smaller in the interval. The soft tissue between the anterior wall of the rectum and the posterior uterus is concerning for a  drop metastasis, similar in the interval. No other metastatic disease is identified. 2. The ascending thoracic aorta measures 4.1 cm. Ascending thoracic aortic aneurysm. Recommend semi-annual imaging followup by CTA or MRA and referral to cardiothoracic surgery if not already obtained. This recommendation follows 2010 ACCF/AHA/AATS/ACR/ASA/SCA/SCAI/SIR/STS/SVM Guidelines for the Diagnosis and Management of Patients With Thoracic Aortic Disease. Circulation. 2010; 121: Y637-C588 3. Atherosclerosis in the aorta. 4. Coronary artery calcifications. Aortic aneurysm NOS (ICD10-I71.9). Aortic Atherosclerosis (ICD10-I70.0).   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 CEA elevated at 39.3 at time of diagnosis. Underwent partial colectomy. She elected did not receive adjuvant chemo given possible risks and likely small benefit. Repeat CT imaging in 12/2016 revealed left liver lesion and soft tissue lesion in the cul-de-sac, both concerning for metastatic disease. Liver lesion biopsied by IR and positive for metastatic disease.  Started FOLFOX chemotherapy on 02/10/17; Avastin added with cycle #3.  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   -Last restaging imaging in 04/2017 showed overall mixed response to treatment with positive response in the liver, but drop mets similar in size. CEA initially elevated and has been trending down; most recent CEA 12.1 on 04/21/17.  -Due for cycle #10 FOLFOX/Avastin today; will hold chemo today given her current symptoms. (see below)   Recent fall with (R) posterior ear ecchymosis/hematoma with associated N&V and mental status changes:  -Given chronic anticoagulation (managed  by PCP) at time of fall, I have clinical concerns for delayed development of subdural hematoma.  Avastin chemotherapy increases her risk for bleeding as well, particularly in the setting of recent trauma.  Persistent N&V and memory changes concerning as well.   -Received 8 mg IV Zofran in clinic today.  -Discussed with patient and daughter my recommendations to send her to ED for further evaluation given recent fall, N&V, mental status changes, hypertension and tachycardia.   -Would recommend MRI brain for further evaluation.  Although colon cancer can metastasize to the brain, it is generally rare and I have lower clinical suspicion that malignancy is reason for her neurologic changes.     *Chemo held today; will bring her back in ~2 weeks for follow-up and consideration for resuming chemotherapy.            Dispo:  -Chemo held today and patient sent to ED for further evaluation.  -Return to cancer center in ~2 weeks for follow-up visit and consideration to resume chemotherapy.    All questions were answered to patient's stated satisfaction. Encouraged patient to call with any new concerns or questions before her next visit to the cancer center and we can certain see her sooner, if needed.    Plan of care discussed with Dr. Talbert Cage, who agrees  with the above aforementioned.    Orders placed this encounter:  No orders of the defined types were placed in this encounter.     Mike Craze, NP [place] [phone]

## 2017-06-30 ENCOUNTER — Emergency Department (HOSPITAL_COMMUNITY)
Admission: EM | Admit: 2017-06-30 | Discharge: 2017-06-30 | Disposition: A | Discharge status: Home / Self Care | Payer: Medicare Other | Attending: Emergency Medicine | Admitting: Emergency Medicine

## 2017-06-30 ENCOUNTER — Encounter (HOSPITAL_BASED_OUTPATIENT_CLINIC_OR_DEPARTMENT_OTHER): Payer: Medicare Other | Admitting: Adult Health

## 2017-06-30 ENCOUNTER — Emergency Department (HOSPITAL_COMMUNITY): Payer: Medicare Other

## 2017-06-30 ENCOUNTER — Encounter (HOSPITAL_BASED_OUTPATIENT_CLINIC_OR_DEPARTMENT_OTHER): Payer: Medicare Other

## 2017-06-30 ENCOUNTER — Other Ambulatory Visit: Payer: Self-pay

## 2017-06-30 ENCOUNTER — Encounter (HOSPITAL_COMMUNITY): Payer: Self-pay

## 2017-06-30 VITALS — BP 154/104 | HR 123 | Temp 98.3°F | Resp 22 | Wt 160.0 lb

## 2017-06-30 DIAGNOSIS — F419 Anxiety disorder, unspecified: Secondary | ICD-10-CM | POA: Diagnosis not present

## 2017-06-30 DIAGNOSIS — C787 Secondary malignant neoplasm of liver and intrahepatic bile duct: Secondary | ICD-10-CM

## 2017-06-30 DIAGNOSIS — C182 Malignant neoplasm of ascending colon: Secondary | ICD-10-CM | POA: Diagnosis present

## 2017-06-30 DIAGNOSIS — C189 Malignant neoplasm of colon, unspecified: Secondary | ICD-10-CM | POA: Insufficient documentation

## 2017-06-30 DIAGNOSIS — Z7901 Long term (current) use of anticoagulants: Secondary | ICD-10-CM | POA: Diagnosis not present

## 2017-06-30 DIAGNOSIS — D500 Iron deficiency anemia secondary to blood loss (chronic): Secondary | ICD-10-CM | POA: Diagnosis present

## 2017-06-30 DIAGNOSIS — Z79899 Other long term (current) drug therapy: Secondary | ICD-10-CM | POA: Diagnosis not present

## 2017-06-30 DIAGNOSIS — Z87891 Personal history of nicotine dependence: Secondary | ICD-10-CM | POA: Insufficient documentation

## 2017-06-30 DIAGNOSIS — R4182 Altered mental status, unspecified: Secondary | ICD-10-CM | POA: Diagnosis present

## 2017-06-30 DIAGNOSIS — Z7984 Long term (current) use of oral hypoglycemic drugs: Secondary | ICD-10-CM | POA: Diagnosis not present

## 2017-06-30 DIAGNOSIS — I10 Essential (primary) hypertension: Secondary | ICD-10-CM | POA: Diagnosis not present

## 2017-06-30 DIAGNOSIS — R112 Nausea with vomiting, unspecified: Secondary | ICD-10-CM

## 2017-06-30 LAB — COMPREHENSIVE METABOLIC PANEL
ALBUMIN: 3.4 g/dL — AB (ref 3.5–5.0)
ALK PHOS: 111 U/L (ref 38–126)
ALT: 19 U/L (ref 14–54)
AST: 29 U/L (ref 15–41)
Anion gap: 13 (ref 5–15)
BILIRUBIN TOTAL: 0.7 mg/dL (ref 0.3–1.2)
BUN: 13 mg/dL (ref 6–20)
CALCIUM: 8.9 mg/dL (ref 8.9–10.3)
CO2: 23 mmol/L (ref 22–32)
Chloride: 103 mmol/L (ref 101–111)
Creatinine, Ser: 0.76 mg/dL (ref 0.44–1.00)
GFR calc Af Amer: >60 mL/min (ref >60)
GFR calc non Af Amer: >60 mL/min (ref >60)
GLUCOSE: 152 mg/dL — AB (ref 65–99)
Potassium: 3.1 mmol/L — ABNORMAL LOW (ref 3.5–5.1)
Sodium: 139 mmol/L (ref 135–145)
TOTAL PROTEIN: 6.9 g/dL (ref 6.5–8.1)

## 2017-06-30 LAB — CBC WITH DIFFERENTIAL/PLATELET
Basophils Absolute: 0 10*3/uL (ref 0.0–0.1)
Basophils Relative: 0 %
Eosinophils Absolute: 0.1 10*3/uL (ref 0.0–0.7)
Eosinophils Relative: 1 %
HCT: 35.3 % — ABNORMAL LOW (ref 36.0–46.0)
HEMOGLOBIN: 11.1 g/dL — AB (ref 12.0–15.0)
LYMPHS ABS: 1.9 10*3/uL (ref 0.7–4.0)
LYMPHS PCT: 17 %
MCH: 33.6 pg (ref 26.0–34.0)
MCHC: 31.4 g/dL (ref 30.0–36.0)
MCV: 107 fL — AB (ref 78.0–100.0)
Monocytes Absolute: 1.3 10*3/uL — ABNORMAL HIGH (ref 0.1–1.0)
Monocytes Relative: 11 %
NEUTROS ABS: 7.9 10*3/uL — AB (ref 1.7–7.7)
NEUTROS PCT: 71 %
Platelets: 227 10*3/uL (ref 150–400)
RBC: 3.3 MIL/uL — AB (ref 3.87–5.11)
RDW: 16.3 % — ABNORMAL HIGH (ref 11.5–15.5)
WBC: 11.1 10*3/uL — AB (ref 4.0–10.5)

## 2017-06-30 LAB — LIPASE, BLOOD: Lipase: 23 U/L (ref 11–51)

## 2017-06-30 LAB — PROTIME-INR
INR: 0.87
Prothrombin Time: 11.8 seconds (ref 11.4–15.2)

## 2017-06-30 MED ORDER — HEPARIN SOD (PORK) LOCK FLUSH 100 UNIT/ML IV SOLN
INTRAVENOUS | Status: AC
  Administered 2017-06-30: 500 [IU]
  Filled 2017-06-30: qty 5

## 2017-06-30 MED ORDER — ONDANSETRON HCL 4 MG/2ML IJ SOLN
4.0000 mg | Freq: Once | INTRAMUSCULAR | Status: AC
  Administered 2017-06-30: 4 mg via INTRAVENOUS

## 2017-06-30 MED ORDER — ONDANSETRON HCL 4 MG/2ML IJ SOLN
INTRAMUSCULAR | Status: AC
  Filled 2017-06-30: qty 4

## 2017-06-30 MED ORDER — ONDANSETRON HCL 40 MG/20ML IJ SOLN: 8.0000 mg | Freq: Once | INTRAMUSCULAR | Status: DC

## 2017-06-30 MED ORDER — SODIUM CHLORIDE 0.9 % IV BOLUS (SEPSIS)
500.0000 mL | Freq: Once | INTRAVENOUS | Status: AC
  Administered 2017-06-30: 500 mL via INTRAVENOUS

## 2017-06-30 MED ORDER — SODIUM CHLORIDE 0.9 % IV SOLN
INTRAVENOUS | Status: DC
Start: 2017-06-30 — End: 2017-06-30

## 2017-06-30 MED ORDER — ONDANSETRON HCL 4 MG/2ML IJ SOLN
4.0000 mg | Freq: Once | INTRAMUSCULAR | Status: AC
  Administered 2017-06-30: 4 mg via INTRAVENOUS
  Filled 2017-06-30: qty 2

## 2017-06-30 MED ORDER — SODIUM CHLORIDE 0.9% FLUSH
10.0000 mL | INTRAVENOUS | Status: DC | PRN
  Administered 2017-06-30: 10 mL
  Filled 2017-06-30: qty 10

## 2017-06-30 NOTE — ED Triage Notes (Signed)
Pt sent from cancer center.  Provider reports that pt was supposed to have chemo today but when she arrived she was tachycardic, hypertensive, and nauseated.  Reports she fell and hit her head on Aug 14 and was evaluated in [place]. Daughter says pt "lost a couple of days" after the fall that she couldn't recall.  Reports has had nausea since starting the chemo in July.  Provider concerned about possibility of a head bleed and suggested pt come to er for evaluation and MRI.  Pt had 260ml normal saline and 8mg  of zofran.

## 2017-06-30 NOTE — ED Provider Notes (Signed)
[facility] DEPT Provider Note   CSN: [id number] Arrival date & time: 06/30/17  1025     History   Chief Complaint Chief Complaint  Patient presents with  . Altered Mental Status    HPI ARBIE REISZ is a 71 y.o. female.  Patient sent down from hematology oncology clinic. Patient is followed by them for chemotherapy she has metastatic  Colon cancer.Patient appeared not well in the clinic there were some concerns whether she was mentating properly. Also other concerns that she had stopped her Coumadin. There is a history of a head injury on August 14. They were worried about perhapssignificant injury from that. Patient did see go to [facility] emergency department that time and did have head CT without any acute findings. Initially it sounded as if may be wanting MRI.The patient's family states that there is no concern for any strokelike symptoms. They feel her mental status is normal. That she was very anxious about going to the clinic. Had a lot of anxiety in route. This is kind of normal for her.      Past Medical History:  Diagnosis Date  . Cancer of right colon ([place]) 07/30/2016  . Colon cancer ([place])   . Depression   . Hypercholesterolemia   . Hypertension   . Iron deficiency anemia due to chronic blood loss 07/30/2016  . Vitamin B12 deficiency 08/13/2016    Patient Active Problem List   Diagnosis Date Noted  . Vitamin B12 deficiency 08/13/2016  . Elevated CEA 07/31/2016  . Cancer of right colon ([place]) 07/30/2016  . Iron deficiency anemia due to chronic blood loss 07/30/2016  . Acute pulmonary embolism ([place]) 07/01/2016  . Colonic mass 06/24/2016  . Anxiety 06/24/2016  . HTN (hypertension) 06/24/2016    Past Surgical History:  Procedure Laterality Date  . COLONOSCOPY N/A 11/05/2016   Procedure: COLONOSCOPY;  Surgeon: Rogene Houston, MD;  Location: [facility] ENDO SUITE;  Service: Endoscopy;  Laterality: N/A;  1:20  . PARTIAL COLECTOMY N/A 06/27/2016   Procedure: PARTIAL  COLECTOMY;  Surgeon: Vickie Epley, MD;  Location: [facility] ORS;  Service: General;  Laterality: N/A;  . PORTACATH PLACEMENT Right 02/09/2017   Procedure: INSERTION PORT-A-CATH;  Surgeon: Aviva Signs, MD;  Location: [facility] ORS;  Service: General;  Laterality: Right;  right subclavian    OB History    No data available       Home Medications    Prior to Admission medications   Medication Sig Start Date End Date Taking? Authorizing Provider  acetaminophen (TYLENOL) 325 MG tablet Take 325 mg by mouth every 6 (six) hours as needed for mild pain or moderate pain.   Yes [provider]  amLODipine (NORVASC) 5 MG tablet Take 5 mg by mouth daily.   Yes [provider]  Ascorbic Acid (VITAMIN C) 1000 MG tablet Take 1,000 mg by mouth daily.   Yes [provider]  busPIRone (BUSPAR) 15 MG tablet Take 15 mg by mouth 2 (two) times daily.  04/29/17  Yes [provider]  carvedilol (COREG) 3.125 MG tablet  06/20/17  Yes [provider]  cyclobenzaprine (FLEXERIL) 10 MG tablet Take 10 mg by mouth 3 (three) times daily as needed for muscle spasms.   Yes [provider]  escitalopram (LEXAPRO) 20 MG tablet Take 20 mg by mouth daily.    Yes [provider]  fluorouracil CALGB 56213 in sodium chloride 0.9 % 150 mL Inject into the vein over 48 hr.   Yes [provider]  HYDROcodone-acetaminophen (NORCO) 5-325 MG tablet Take 1 tablet by mouth every 6 (six) hours as needed for moderate pain. 02/09/17  Yes Aviva Signs, MD  LEUCOVORIN CALCIUM IV Inject into the vein. Every 2 weeks   Yes [provider]  Loperamide HCl (IMODIUM PO) Take 1 tablet by mouth daily as needed (for diarrhea).    Yes [provider]  metFORMIN (GLUCOPHAGE) 500 MG tablet Take 500 mg by mouth daily with breakfast.  05/30/17  Yes [provider]  Omega-3 Fatty Acids (FISH OIL) 1200 MG CAPS Take 1,200 capsules by mouth daily.    Yes [provider]   OXALIPLATIN IV Inject into the vein. Every 2 weeks   Yes [provider]  prochlorperazine (COMPAZINE) 10 MG tablet Take 1 tablet (10 mg total) by mouth every 6 (six) hours as needed (Nausea or vomiting). 02/03/17  Yes Brunetta Genera, MD  simvastatin (ZOCOR) 40 MG tablet Take 40 mg by mouth daily.   Yes [provider]  warfarin (COUMADIN) 1 MG tablet Take 1-5 mg by mouth See admin instructions. Takes 1 mg daily except 2.5 mg on Sundays and Thursdays.   Yes [provider]  warfarin (COUMADIN) 5 MG tablet Take 1-5 mg by mouth See admin instructions. Takes 1 mg daily except 2.5 mg on Sundays and Thursdays. 12/22/16  Yes [provider]  dexamethasone (DECADRON) 4 MG tablet Take 2 tablets (8 mg total) by mouth daily. Start the day after chemotherapy for 2 days. Take with food. Patient not taking: Reported on 06/30/2017 02/03/17   Brunetta Genera, MD  lidocaine-prilocaine (EMLA) cream Apply to affected area once Patient not taking: Reported on 06/30/2017 02/03/17   Brunetta Genera, MD  ondansetron (ZOFRAN) 8 MG tablet Take 1 tablet (8 mg total) by mouth 2 (two) times daily as needed for refractory nausea / vomiting. Start on day 3 after chemotherapy. Patient not taking: Reported on 06/30/2017 02/03/17   Brunetta Genera, MD    Family History Family History  Problem Relation Age of Onset  . Hypertension Mother   . Other Father        some type of "stomach ailment"  . Suicidality Brother   . Stomach cancer Maternal Aunt   . Brain cancer Brother     Social History Social History  Substance Use Topics  . Smoking status: Former Smoker    Packs/day: 0.50    Years: 48.00    Types: Cigarettes    Quit date: 06/24/2016  . Smokeless tobacco: Never Used     Comment: quit smoking Sept 2017  . Alcohol use No     Comment: glass wine  twice per year     Allergies   Patient has no known allergies.   Review of Systems Review of Systems    Constitutional: Negative for fever.  HENT: Negative for congestion.   Eyes: Negative for visual disturbance.  Respiratory: Negative for shortness of breath.   Cardiovascular: Negative for chest pain.  Gastrointestinal: Negative for abdominal pain.  Genitourinary: Negative for dysuria.  Musculoskeletal: Negative for back pain.  Skin: Negative for rash.  Neurological: Negative for headaches.  Hematological: Does not bruise/bleed easily.  Psychiatric/Behavioral: Negative for confusion. The patient is nervous/anxious.      Physical Exam Updated Vital Signs BP (!) 142/86   Pulse (!) 113   Temp 98.1 F (36.7 C) (Oral)   Resp 18   Ht 1.575 m (5\' 2" )   Wt 72.6 kg (  160 lb)   SpO2 95%   BMI 29.26 kg/m   Physical Exam  Constitutional: She is oriented to person, place, and time. She appears well-developed and well-nourished.  HENT:  Head: Normocephalic and atraumatic.  Mouth/Throat: Oropharynx is clear and moist.  Eyes: Pupils are equal, round, and reactive to light. EOM are normal.  Neck: Normal range of motion. Neck supple.  Cardiovascular: Regular rhythm and normal heart sounds.   Slightly tachycardic  Pulmonary/Chest: Effort normal and breath sounds normal.  Abdominal: Soft. Bowel sounds are normal. There is no tenderness.  Musculoskeletal: Normal range of motion.  Neurological: She is alert and oriented to person, place, and time. No cranial nerve deficit or sensory deficit. She exhibits normal muscle tone. Coordination normal.  Skin: Skin is warm.  Nursing note and vitals reviewed.    ED Treatments / Results  Labs (all labs ordered are listed, but only abnormal results are displayed) Labs Reviewed  LIPASE, BLOOD  PROTIME-INR    EKG  EKG Interpretation None      ED ECG REPORT   Date: 06/30/2017  Rate: 121  Rhythm: sinus tachycardia  QRS Axis: left  Intervals: normal  ST/T Wave abnormalities: nonspecific ST/T changes  Conduction Disutrbances:none   Narrative Interpretation:   Old EKG Reviewed: none available In addition did have some PVCs.I have personally reviewed the EKG tracing and agree with the computerized printout as noted.  Radiology Ct Head Wo Contrast  Result Date: 06/30/2017 CLINICAL DATA:  tachycardic, hypertensive, and nauseated. Reports she fell and hit her head on Aug 14 and was evaluated in [place]. Daughter says pt "lost a couple of days" after the fall that she couldn't recall. Reports has had nausea since starting the chemo in July. ; pt dizzy and nauseous today; colon ca EXAM: CT HEAD WITHOUT CONTRAST TECHNIQUE: Contiguous axial images were obtained from the base of the skull through the vertex without intravenous contrast. COMPARISON:  None. FINDINGS: Brain: Mild parenchymal atrophy. Patchy areas of hypoattenuation in deep and periventricular white matter bilaterally. Negative for acute intracranial hemorrhage, mass lesion, acute infarction, midline shift, or mass-effect. Acute infarct may be inapparent on noncontrast CT. Ventricles and sulci symmetric. Vascular: No hyperdense vessel or unexpected calcification. Skull: Normal. Negative for fracture or focal lesion. Sinuses/Orbits: No acute finding. Other: None. IMPRESSION: 1. Negative for bleed or other acute intracranial process. 2. Atrophy and nonspecific white matter changes. Electronically Signed   By: Lucrezia Europe M.D.   On: 06/30/2017 12:23    Procedures Procedures (including critical care time)  Medications Ordered in ED Medications  0.9 %  sodium chloride infusion (not administered)  sodium chloride 0.9 % bolus 500 mL (0 mLs Intravenous Stopped 06/30/17 1229)  ondansetron (ZOFRAN) injection 4 mg (4 mg Intravenous Given 06/30/17 1149)     Initial Impression / Assessment and Plan / ED Course  I have reviewed the triage vital signs and the nursing notes.  Pertinent labs & imaging results that were available during my care of the patient were reviewed by me and  considered in my medical decision making (see chart for details).     Since daughter not concerned about any strokelike activity or any significant confusion. Head CT was repeated here just to rule out any trauma although was reported negative for from [facility] according to family but they did not trust the reading. It was negative again here today. Patient mentally very alert oriented to date and seems to be quite functional. Did have some slight tachycardia. Occasionally  would go up to low 100s at times would be down to 80. I do not feel that there was any significant abnormality. Labs were done at hematology oncology clinic and no significant changes from baseline for her. EKG here did show a sinus tachycardia originally 121 but stated that did improve.  Final Clinical Impressions(s) / ED Diagnoses   Final diagnoses:  Anxiety  Malignant neoplasm of colon, unspecified part of colon [facility])    New Prescriptions New Prescriptions   No medications on file     Fredia Sorrow, MD 06/30/17 1556

## 2017-06-30 NOTE — Discharge Instructions (Signed)
Return for any new or worse symptoms. Keep your follow-up appointments with hematology oncology. Make sure you drink plenty of fluids. Today's workup to include head CT and labs done upstairs without any sniffing abnormalities. Contact the hematology oncology clinic about restarting your Coumadin.

## 2017-06-30 NOTE — Progress Notes (Signed)
Patient here today for treatment. She states that she is not feeling well. She has been overall feeling depressed and crying a lot.  She states that since she feels like she has had a stroke. She is missing time and hasn't been taking her medication but doesn't know why. She realized that she wasn't taking them but still didn't take them.  It has been over 2 weeks since patient has taken anything other than a pain pill or a nausea pill PRN.   0910 patient c/o nausea, orders received for 8 mg iv push zofran one dose per Fortunato Curling, NP.   Patient evaluated by the provider, she is being transported to the emergency room for further evaluation.

## 2017-07-01 LAB — CEA: CEA: 9.7 ng/mL — ABNORMAL HIGH (ref 0.0–4.7)

## 2017-07-02 ENCOUNTER — Encounter (HOSPITAL_COMMUNITY): Payer: Medicare Other

## 2017-07-14 ENCOUNTER — Encounter (HOSPITAL_COMMUNITY): Payer: Medicare Other | Admitting: Dietician

## 2017-07-14 ENCOUNTER — Encounter (HOSPITAL_BASED_OUTPATIENT_CLINIC_OR_DEPARTMENT_OTHER): Payer: Medicare Other | Admitting: Oncology

## 2017-07-14 ENCOUNTER — Encounter (HOSPITAL_COMMUNITY): Payer: Self-pay

## 2017-07-14 ENCOUNTER — Encounter (HOSPITAL_COMMUNITY): Payer: Medicare Other | Attending: Hematology & Oncology

## 2017-07-14 VITALS — BP 141/88 | HR 111 | Temp 98.3°F | Resp 20 | Wt 151.2 lb

## 2017-07-14 DIAGNOSIS — Z79899 Other long term (current) drug therapy: Secondary | ICD-10-CM | POA: Diagnosis not present

## 2017-07-14 DIAGNOSIS — C787 Secondary malignant neoplasm of liver and intrahepatic bile duct: Secondary | ICD-10-CM | POA: Diagnosis not present

## 2017-07-14 DIAGNOSIS — C182 Malignant neoplasm of ascending colon: Secondary | ICD-10-CM

## 2017-07-14 DIAGNOSIS — Z5112 Encounter for antineoplastic immunotherapy: Secondary | ICD-10-CM | POA: Diagnosis present

## 2017-07-14 DIAGNOSIS — R42 Dizziness and giddiness: Secondary | ICD-10-CM | POA: Diagnosis not present

## 2017-07-14 DIAGNOSIS — Z5111 Encounter for antineoplastic chemotherapy: Secondary | ICD-10-CM

## 2017-07-14 DIAGNOSIS — Z87891 Personal history of nicotine dependence: Secondary | ICD-10-CM | POA: Diagnosis not present

## 2017-07-14 DIAGNOSIS — D500 Iron deficiency anemia secondary to blood loss (chronic): Secondary | ICD-10-CM | POA: Diagnosis present

## 2017-07-14 LAB — COMPREHENSIVE METABOLIC PANEL
ALBUMIN: 3.7 g/dL (ref 3.5–5.0)
ALT: 12 U/L — ABNORMAL LOW (ref 14–54)
ANION GAP: 11 (ref 5–15)
AST: 20 U/L (ref 15–41)
Alkaline Phosphatase: 82 U/L (ref 38–126)
BILIRUBIN TOTAL: 0.7 mg/dL (ref 0.3–1.2)
BUN: 11 mg/dL (ref 6–20)
CHLORIDE: 100 mmol/L — AB (ref 101–111)
CO2: 26 mmol/L (ref 22–32)
Calcium: 8.9 mg/dL (ref 8.9–10.3)
Creatinine, Ser: 0.76 mg/dL (ref 0.44–1.00)
GFR calc Af Amer: >60 mL/min (ref >60)
GFR calc non Af Amer: >60 mL/min (ref >60)
GLUCOSE: 115 mg/dL — AB (ref 65–99)
POTASSIUM: 3.5 mmol/L (ref 3.5–5.1)
SODIUM: 137 mmol/L (ref 135–145)
TOTAL PROTEIN: 7 g/dL (ref 6.5–8.1)

## 2017-07-14 LAB — URINALYSIS, DIPSTICK ONLY
Bilirubin Urine: NEGATIVE
GLUCOSE, UA: NEGATIVE mg/dL
HGB URINE DIPSTICK: NEGATIVE
KETONES UR: NEGATIVE mg/dL
LEUKOCYTES UA: NEGATIVE
Nitrite: NEGATIVE
PH: 6 (ref 5.0–8.0)
SPECIFIC GRAVITY, URINE: 1.015 (ref 1.005–1.030)

## 2017-07-14 LAB — CBC WITH DIFFERENTIAL/PLATELET
BASOS ABS: 0 10*3/uL (ref 0.0–0.1)
BASOS PCT: 0 %
EOS ABS: 0.2 10*3/uL (ref 0.0–0.7)
Eosinophils Relative: 2 %
HEMATOCRIT: 38.1 % (ref 36.0–46.0)
Hemoglobin: 11.9 g/dL — ABNORMAL LOW (ref 12.0–15.0)
Lymphocytes Relative: 22 %
Lymphs Abs: 2.5 10*3/uL (ref 0.7–4.0)
MCH: 32.5 pg (ref 26.0–34.0)
MCHC: 31.2 g/dL (ref 30.0–36.0)
MCV: 104.1 fL — ABNORMAL HIGH (ref 78.0–100.0)
MONO ABS: 0.9 10*3/uL (ref 0.1–1.0)
Monocytes Relative: 8 %
NEUTROS ABS: 7.6 10*3/uL (ref 1.7–7.7)
NEUTROS PCT: 68 %
Platelets: 275 10*3/uL (ref 150–400)
RBC: 3.66 MIL/uL — ABNORMAL LOW (ref 3.87–5.11)
RDW: 14.6 % (ref 11.5–15.5)
WBC: 11.2 10*3/uL — ABNORMAL HIGH (ref 4.0–10.5)

## 2017-07-14 MED ORDER — DEXAMETHASONE SODIUM PHOSPHATE 10 MG/ML IJ SOLN
10.0000 mg | Freq: Once | INTRAMUSCULAR | Status: AC
  Administered 2017-07-14: 10 mg via INTRAVENOUS

## 2017-07-14 MED ORDER — SODIUM CHLORIDE 0.9% FLUSH
10.0000 mL | INTRAVENOUS | Status: DC | PRN
  Administered 2017-07-14: 10 mL
  Filled 2017-07-14: qty 10

## 2017-07-14 MED ORDER — SODIUM CHLORIDE 0.9 % IV SOLN
5.0000 mg/kg | Freq: Once | INTRAVENOUS | Status: AC
  Administered 2017-07-14: 350 mg via INTRAVENOUS
  Filled 2017-07-14: qty 14

## 2017-07-14 MED ORDER — MECLIZINE HCL 32 MG PO TABS: 32.0000 mg | ORAL_TABLET | Freq: Three times a day (TID) | ORAL | 1 refills | Status: AC | PRN

## 2017-07-14 MED ORDER — LEUCOVORIN CALCIUM INJECTION 350 MG
405.0000 mg/m2 | Freq: Once | INTRAVENOUS | Status: AC
  Administered 2017-07-14: 700 mg via INTRAVENOUS
  Filled 2017-07-14: qty 35

## 2017-07-14 MED ORDER — PALONOSETRON HCL INJECTION 0.25 MG/5ML
0.2500 mg | Freq: Once | INTRAVENOUS | Status: AC
  Administered 2017-07-14: 0.25 mg via INTRAVENOUS

## 2017-07-14 MED ORDER — DEXAMETHASONE SODIUM PHOSPHATE 10 MG/ML IJ SOLN
INTRAMUSCULAR | Status: AC
  Filled 2017-07-14: qty 1

## 2017-07-14 MED ORDER — SODIUM CHLORIDE 0.9 % IV SOLN
INTRAVENOUS | Status: DC
  Administered 2017-07-14: 500 mL via INTRAVENOUS

## 2017-07-14 MED ORDER — PALONOSETRON HCL INJECTION 0.25 MG/5ML
INTRAVENOUS | Status: AC
  Filled 2017-07-14: qty 5

## 2017-07-14 MED ORDER — FLUOROURACIL CHEMO INJECTION 2.5 GM/50ML
400.0000 mg/m2 | Freq: Once | INTRAVENOUS | Status: AC
  Administered 2017-07-14: 700 mg via INTRAVENOUS
  Filled 2017-07-14: qty 14

## 2017-07-14 MED ORDER — DEXTROSE 5 % IV SOLN
Freq: Once | INTRAVENOUS | Status: AC
  Administered 2017-07-14: 12:00:00 via INTRAVENOUS

## 2017-07-14 MED ORDER — OXALIPLATIN CHEMO INJECTION 100 MG/20ML
85.0000 mg/m2 | Freq: Once | INTRAVENOUS | Status: AC
  Administered 2017-07-14: 145 mg via INTRAVENOUS
  Filled 2017-07-14: qty 9

## 2017-07-14 MED ORDER — SODIUM CHLORIDE 0.9 % IV SOLN
2400.0000 mg/m2 | INTRAVENOUS | Status: DC
  Administered 2017-07-14: 4150 mg via INTRAVENOUS
  Filled 2017-07-14: qty 83

## 2017-07-14 NOTE — Progress Notes (Signed)
Labs reviewed by Dr. Talbert Cage and ok to treat today.  Oncology visit today.    Patient tolerated chemotherapy today with no complaints voiced. Port site clean and dry with no bruising or swelling noted at site. Dressing intact.  Chemotherapy 5FU pump infusing with no alarms sounding.  VSS with discharge and left ambulatory with family.

## 2017-07-14 NOTE — Progress Notes (Signed)
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 There is a small adjacent foci of gas and free fluid raising the possibility of micro perforation. There is marked dilatation of the cecum and small bowel with fluid and gas most compatible with proximal obstruction caused by the large ascending colonic mass. Additionally there is a 6 cm fatty mass involving the colon at the level the hepatic flexure, most suggestive of a lipoma. Aortic vascular calcifications. Sigmoid colonic diverticulosis without evidence for acute diverticulitis.No specific findings identified to suggest metastatic disease to the chest.There is a 3 mm nodule identified within the left upper lobe, nonspecific.Bilateral pleural effusions with overlying compressive type atelectasis and consolidation       06/26/2016 Tumor Marker    CEA 39.3 ng/ml      06/27/2016 Surgery    Partial colectomy with primary linear stapled anastomosis.  Large bulky mid-ascending colonic mass extending into the colonic mesentery and retroperitoneum with a layer of not visibly/palpably involved fat between the colon and underlying visualized and exposed duodenum and Right kidney      06/27/2016 Pathology Results    COLONIC ADENOCARCINOMA (8.5 CM), GRADE 2 THE TUMOR INVADES THROUGH THE MUSCULARIS PROPRIA INTO PERICOLONIC TISSUE  ALL MARGINS OF RESECTION ARE NEGATIVE FOR CARCINOMA FOURTEEN BENIGN LYMPH NODES (0/14) TUBULAR ADENOMA  (X3) UNREMARKABLE APPENDIX      06/30/2016 Imaging    CT angio chest Positive examination for right upper lobe and right lower lobe pulmonary emboli. No evidence of right heart strain. Again demonstrated are bilateral pleural effusions and basilar atelectasis, greater on the right.      07/30/2016 Tumor Marker      Ref. Range 07/30/2016 16:46  CEA Latest Ref Range: 0.0 - 4.7 ng/mL 4.0        07/30/2016 Miscellaneous    Medical Oncology consultation.  No adjuvant chemotherapy needed based upon risk factors and small benefit.  Informed decision made by patient to not pursue systemic chemotherapy in adjuvant setting.      11/05/2016 Procedure    Colonoscopy by Dr. Laural Golden.  soft anal tags found on digital rectal exam. - Patent end-to-side colo-colonic anastomosis, characterized by healthy appearing mucosa. - Three 5 to 7 mm polyps in the proximal transverse colon, removed with a cold snare. Resected and retrieved. - One 10 mm polyp in the proximal transverse colon, removed with a hot snare. Resected and retrieved. - One 20 mm polyp in the proximal sigmoid colon, removed with a hot snare. Resected and retrieved. Clip (MR conditional) was placed. - One 15 to 25 mm polyp at the recto-sigmoid colon, removed with a hot snare. Resected and retrieved. Clip (MR conditional) was placed. - Diverticulosis in the sigmoid colon and in the descending colon. - The distal rectum and anal verge are normal on retroflexion view.      11/07/2016 Pathology Results    1. Colon, polyp(s), transverse proximal - TUBULAR ADENOMA(S). - HIGH GRADE DYSPLASIA IS NOT IDENTIFIED. 2. Colon, polyp(s), proximal  sigmoid - TUBULOVILLOUS ADENOMA WITH FOCAL HIGH GRADE DYSPLASIA (5%). 3. Rectosigmoid , polyp - TUBULOVILLOUS ADENOMA WITH HIGH GRADE DYSPLASIA (15%).      12/26/2016 Imaging    CT abd/pelvis-1. Interval development of a 2.4 cm rim enhancing lesion in the lateral segment left liver, highly suspicious for  metastatic disease. 2. New abnormal irregular soft tissue in the cul-de-sac, involving the anterior wall of the rectum and posterior cervix. This is highly suspicious for metastatic involvement (drop lesion). 3. Interval Right hemicolectomy. 4.  Abdominal Aortic Atherosclerois (ICD10-170.0)      01/16/2017 Procedure    IR biopsy of hepatic lesion      01/19/2017 Pathology Results    Liver, needle/core biopsy, Left Lobe - ADENOCARCINOMA Microscopic Comment The morphology is consistent with metastatic colorectal adenocarcinoma. There is sufficient tissue for additional studies. Called to [facility] on 01/19/17      02/06/2017 Imaging    CT chest- 1. Stable CT chest. No substantial change in the 7 mm short axis left paraesophageal lymph node. 2. Rim enhancing lesion lateral segment left liver has progressed in the interval      02/09/2017 Procedure    Port-A-Cath insertion by Dr. Arnoldo Morale      02/10/2017 -  Chemotherapy    The patient had palonosetron (ALOXI) injection 0.25 mg, 0.25 mg, Intravenous,  Once, 1 of 4 cycles  leucovorin 800 mg in dextrose 5 % 250 mL infusion, 404 mg/m2 = 792 mg, Intravenous,  Once, 1 of 4 cycles  oxaliplatin (ELOXATIN) 170 mg in dextrose 5 % 500 mL chemo infusion, 85 mg/m2 = 170 mg, Intravenous,  Once, 1 of 4 cycles  fluorouracil (ADRUCIL) chemo injection 800 mg, 400 mg/m2 = 800 mg, Intravenous,  Once, 1 of 4 cycles  fluorouracil (ADRUCIL) 4,750 mg in sodium chloride 0.9 % 55 mL chemo infusion, 2,400 mg/m2 = 4,750 mg, Intravenous, 1 Day/Dose, 1 of 4 cycles  for chemotherapy treatment.        03/11/2017 Pathology Results    FoundationONE- KRAS wildtype, NRAS wildtype, MS stable, TMB 6 Muts/Mb, APC I097*, APC D532*, TERT promoter-124C>T, TP53 LOSS.  Therapies with clinical benefit include cetuximab, Panitumumab.      05/01/2017 Imaging    CT C/A/P: 1. The left hepatic metastasis is smaller in the interval. The soft tissue between the anterior wall of  the rectum and the posterior uterus is concerning for a drop metastasis, similar in the interval. No other metastatic disease is identified. 2. The ascending thoracic aorta measures 4.1 cm. Ascending thoracic aortic aneurysm. Recommend semi-annual imaging followup by CTA or MRA and referral to cardiothoracic surgery if not already obtained. This recommendation follows 2010 ACCF/AHA/AATS/ACR/ASA/SCA/SCAI/SIR/STS/SVM Guidelines for the Diagnosis and Management of Patients With Thoracic Aortic Disease. Circulation. 2010; 121: D924-Q683 3. Atherosclerosis in the aorta. 4. Coronary artery calcifications.        HISTORY OF PRESENT ILLNESS:  (From Dr. Laverle Patter last note on 06/02/17)  Ursula Beath 70 y.o. female is here for follow up of stage IV, T3 N0 M1 ascending colonic adenocarcinoma with liver metastasis.  Avastin was added to FOLFOX starting with cycle 3 of chemo due to evidence of progression due to enlarging liver met.      INTERVAL HISTORY:  Ms. Labrecque 71 y.o. female returns for routine follow-up for metastatic colon cancer with her daughter.   She is due for cycle 10 FOLFOX/Avastin today. Her cycle 10 was held due to a fall where she hit her head on 06/16/17 and had  subsequently had been having changes in her mental status.  There was concern for possible brain bleed since she is on coumadin at home.  Patient went to the ER 06/30/17 for evaluation after her fall and had CT head without contrast which was negative for bleed or other acute intracranial process. Today patient states that her confusion has improved and she baths her baseline. She does have persistent dizziness, which is worsened by positional changes and motion sickness. She states that she is even vomited a few times due to the dizziness. Her head is healing up from her fall. Otherwise she denies any chest pain, shortness breath, abdominal pain, diarrhea, focal weakness.    REVIEW OF SYSTEMS:  Review of Systems -  Oncology (per HPI) ROS as per HPI otherwise 12 point ROS is negative.  PAST MEDICAL/SURGICAL HISTORY:  Past Medical History:  Diagnosis Date  . Cancer of right colon ([place]) 07/30/2016  . Colon cancer ([place])   . Depression   . Hypercholesterolemia   . Hypertension   . Iron deficiency anemia due to chronic blood loss 07/30/2016  . Vitamin B12 deficiency 08/13/2016   Past Surgical History:  Procedure Laterality Date  . COLONOSCOPY N/A 11/05/2016   Procedure: COLONOSCOPY;  Surgeon: Rogene Houston, MD;  Location: [facility];  Service: Endoscopy;  Laterality: N/A;  1:20  . PARTIAL COLECTOMY N/A 06/27/2016   Procedure: PARTIAL COLECTOMY;  Surgeon: Vickie Epley, MD;  Location: [facility] ORS;  Service: General;  Laterality: N/A;  . PORTACATH PLACEMENT Right 02/09/2017   Procedure: INSERTION PORT-A-CATH;  Surgeon: Aviva Signs, MD;  Location: [facility];  Service: General;  Laterality: Right;  right subclavian     SOCIAL HISTORY:  Social History   Social History  . Marital status: Widowed    Spouse name: N/A  . Number of children: N/A  . Years of education: N/A   Occupational History  . Not on file.   Social History Main Topics  . Smoking status: Former Smoker    Packs/day: 0.50    Years: 48.00    Types: Cigarettes    Quit date: 06/24/2016  . Smokeless tobacco: Never Used     Comment: quit smoking Sept 2017  . Alcohol use No     Comment: glass wine  twice per year  . Drug use: No  . Sexual activity: No     Comment: widowed- 1 daughter   Other Topics Concern  . Not on file   Social History Narrative  . No narrative on file    FAMILY HISTORY:  Family History  Problem Relation Age of Onset  . Hypertension Mother   . Other Father        some type of "stomach ailment"  . Suicidality Brother   . Stomach cancer Maternal Aunt   . Brain cancer Brother     CURRENT MEDICATIONS:  Outpatient Encounter Prescriptions as of 07/14/2017  Medication Sig  . acetaminophen (TYLENOL) 325 MG  tablet Take 325 mg by mouth every 6 (six) hours as needed for mild pain or moderate pain.  Marland Kitchen amLODipine (NORVASC) 5 MG tablet Take 5 mg by mouth daily.  . Ascorbic Acid (VITAMIN C) 1000 MG tablet Take 1,000 mg by mouth daily.  . busPIRone (BUSPAR) 15 MG tablet Take 15 mg by mouth 2 (two) times daily.   . carvedilol (COREG) 3.125 MG tablet   . cyclobenzaprine (FLEXERIL) 10 MG tablet Take 10 mg by mouth 3 (three) times daily as needed for muscle  spasms.  Marland Kitchen dexamethasone (DECADRON) 4 MG tablet Take 2 tablets (8 mg total) by mouth daily. Start the day after chemotherapy for 2 days. Take with food. (Patient not taking: Reported on 06/30/2017)  . escitalopram (LEXAPRO) 20 MG tablet Take 20 mg by mouth daily.   . fluorouracil CALGB 82500 in sodium chloride 0.9 % 150 mL Inject into the vein over 48 hr.  . HYDROcodone-acetaminophen (NORCO) 5-325 MG tablet Take 1 tablet by mouth every 6 (six) hours as needed for moderate pain.  Marland Kitchen LEUCOVORIN CALCIUM IV Inject into the vein. Every 2 weeks  . lidocaine-prilocaine (EMLA) cream Apply to affected area once (Patient not taking: Reported on 06/30/2017)  . Loperamide HCl (IMODIUM PO) Take 1 tablet by mouth daily as needed (for diarrhea).   . metFORMIN (GLUCOPHAGE) 500 MG tablet Take 500 mg by mouth daily with breakfast.   . Omega-3 Fatty Acids (FISH OIL) 1200 MG CAPS Take 1,200 capsules by mouth daily.   . ondansetron (ZOFRAN) 8 MG tablet Take 1 tablet (8 mg total) by mouth 2 (two) times daily as needed for refractory nausea / vomiting. Start on day 3 after chemotherapy. (Patient not taking: Reported on 06/30/2017)  . OXALIPLATIN IV Inject into the vein. Every 2 weeks  . prochlorperazine (COMPAZINE) 10 MG tablet Take 1 tablet (10 mg total) by mouth every 6 (six) hours as needed (Nausea or vomiting).  . simvastatin (ZOCOR) 40 MG tablet Take 40 mg by mouth daily.  Marland Kitchen warfarin (COUMADIN) 1 MG tablet Take 1-5 mg by mouth See admin instructions. Takes 1 mg daily except 2.5  mg on Sundays and Thursdays.  Marland Kitchen warfarin (COUMADIN) 5 MG tablet Take 1-5 mg by mouth See admin instructions. Takes 1 mg daily except 2.5 mg on Sundays and Thursdays.   No facility-administered encounter medications on file as of 07/14/2017.     ALLERGIES:  No Known Allergies   PHYSICAL EXAM:  ECOG Performance status: 2-3 - Symptomatic; requires assistance  Vital signs blood pressure 135/89 pulse 119 temperature 98.6 O2 sat 97%   Physical Exam  Eyes: Conjunctivae are normal. No scleral icterus.  Neck: Normal range of motion. Neck supple.  Cardiovascular:  Tachycardic  Pulmonary/Chest: Effort normal and breath sounds normal. No respiratory distress. She has no wheezes.  Abdominal: Soft. Bowel sounds are normal. There is no tenderness.  Musculoskeletal: Normal range of motion.  Lymphadenopathy:    She has no cervical adenopathy.  Neurological: She is alert.  Skin: Skin is warm and dry. No rash noted.  Psychiatric: She exhibits a depressed mood.  Nursing note and vitals reviewed.    LABORATORY DATA:  I have reviewed the labs as listed.  CBC    Component Value Date/Time   WBC 11.1 (H) 06/30/2017 0859   RBC 3.30 (L) 06/30/2017 0859   HGB 11.1 (L) 06/30/2017 0859   HCT 35.3 (L) 06/30/2017 0859   PLT 227 06/30/2017 0859   MCV 107.0 (H) 06/30/2017 0859   MCH 33.6 06/30/2017 0859   MCHC 31.4 06/30/2017 0859   RDW 16.3 (H) 06/30/2017 0859   LYMPHSABS 1.9 06/30/2017 0859   MONOABS 1.3 (H) 06/30/2017 0859   EOSABS 0.1 06/30/2017 0859   BASOSABS 0.0 06/30/2017 0859   CMP Latest Ref Rng & Units 06/30/2017 06/15/2017 06/02/2017  Glucose 65 - 99 mg/dL 152(H) 109(H) 142(H)  BUN 6 - 20 mg/dL 13 13 12   Creatinine 0.44 - 1.00 mg/dL 0.76 0.68 0.97  Sodium 135 - 145 mmol/L 139 140 141  Potassium  3.5 - 5.1 mmol/L 3.1(L) 2.8(L) 3.0(L)  Chloride 101 - 111 mmol/L 103 103 104  CO2 22 - 32 mmol/L 23 27 26   Calcium 8.9 - 10.3 mg/dL 8.9 8.0(L) 7.9(L)  Total Protein 6.5 - 8.1 g/dL 6.9 5.7(L)  5.9(L)  Total Bilirubin 0.3 - 1.2 mg/dL 0.7 0.8 0.6  Alkaline Phos 38 - 126 U/L 111 52 50  AST 15 - 41 U/L 29 28 29   ALT 14 - 54 U/L 19 16 17     PENDING LABS:    DIAGNOSTIC IMAGING:  *The following radiologic images and reports have been reviewed independently and agree with below findings.  CT chest/abd/pelvis: 05/01/17 CLINICAL DATA:  Re- stage colon cancer  EXAM: CT CHEST, ABDOMEN, AND PELVIS WITH CONTRAST  TECHNIQUE: Multidetector CT imaging of the chest, abdomen and pelvis was performed following the standard protocol during bolus administration of intravenous contrast.  CONTRAST:  175m ISOVUE-300 IOPAMIDOL (ISOVUE-300) INJECTION 61%  COMPARISON:  CT the abdomen and pelvis December 26, 2016  FINDINGS: CT CHEST FINDINGS  Cardiovascular: Coronary artery calcifications are seen. The ascending thoracic aorta measures 4.1 cm in AP dimension on series 2, image 25. Mild atherosclerotic changes are seen in the aorta. No dissection. The central pulmonary arteries are normal. No central filling defects on limited images.  Mediastinum/Nodes: No enlarged mediastinal, hilar, or axillary lymph nodes. Thyroid gland, trachea, and esophagus demonstrate no significant findings.  Lungs/Pleura: The central airways are normal no pneumothorax mild atelectasis in the lingula. No suspicious pulmonary nodules or masses. No focal infiltrates.  Musculoskeletal: See below  CT ABDOMEN PELVIS FINDINGS  Hepatobiliary: The metastatic lesion measures 15.3 mm today versus 23.8 mm previously. Focal fatty deposition adjacent to the falciform ligament is identified. No other focal liver lesions are seen. Hepatic steatosis is noted. The portal vein is patent. The gallbladder is normal.  Pancreas: Unremarkable. No pancreatic ductal dilatation or surrounding inflammatory changes.  Spleen: Normal in size without focal abnormality.  Adrenals/Urinary Tract: A few scattered cysts  are seen in the right kidney. The adrenal glands and kidneys are otherwise normal. The ureters and bladder are normal.  Stomach/Bowel: The stomach and small bowel are normal. Colonic diverticulosis is seen without diverticulitis. The patient is status post partial colectomy. Remainder of the colon is otherwise normal.  Vascular/Lymphatic: Atherosclerosis is seen in the non aneurysmal aorta. No suspicious adenopathy.  Reproductive: Uterus and bilateral adnexa are unremarkable.  Other: No free air free fluid. The soft tissue between the anterior rectum and the posterior uterus is again identified and suspicious for a drop metastasis as described on the previous study.  Musculoskeletal: No acute or significant osseous findings.  IMPRESSION: 1. The left hepatic metastasis is smaller in the interval. The soft tissue between the anterior wall of the rectum and the posterior uterus is concerning for a drop metastasis, similar in the interval. No other metastatic disease is identified. 2. The ascending thoracic aorta measures 4.1 cm. Ascending thoracic aortic aneurysm. Recommend semi-annual imaging followup by CTA or MRA and referral to cardiothoracic surgery if not already obtained. This recommendation follows 2010 ACCF/AHA/AATS/ACR/ASA/SCA/SCAI/SIR/STS/SVM Guidelines for the Diagnosis and Management of Patients With Thoracic Aortic Disease. Circulation. 2010; 121: [id number]. Atherosclerosis in the aorta. 4. Coronary artery calcifications. Aortic aneurysm NOS (ICD10-I71.9). Aortic Atherosclerosis (ICD10-I70.0).   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 CEA elevated at 39.3 at time of diagnosis. Underwent partial colectomy. She elected did not  receive adjuvant chemo given possible risks and likely small benefit. Repeat CT imaging in 12/2016 revealed left liver lesion and soft tissue lesion in the cul-de-sac, both concerning for metastatic disease. Liver lesion biopsied by IR and positive for metastatic disease.  Started FOLFOX chemotherapy on 02/10/17; Avastin added with cycle #3.  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   -Last restaging imaging in 04/2017 showed overall mixed response to treatment with positive response in the liver, but drop mets similar in size. CEA initially elevated and has been trending down; most recent CEA 12.1 on 04/21/17.  -Labs reviewed, proceed with cycle 10 FOLFOX/Avastin today. -Plan to repeat restaging scans after she completes cycle 12.   Dizziness -Likely related to her recent fall. -CT head w/o contrast on 06/30/17 was negative for brain bleed. -Will prescribe meclizine PRN today to see if this will help with her dizziness.   RTC q14 days for chemotherapy. Plan to see her in follow up in 4 weeks.   All questions were answered to patient's stated satisfaction. Encouraged patient to call with any new concerns or questions before her next visit to the cancer center and we can certain see her sooner, if needed.    Twana First, MD

## 2017-07-14 NOTE — Progress Notes (Signed)
Nutrition Assessment  Reason for Assessment:  MST  ASSESSMENT: 71 y/o female PMHx HTN, HLD Depression and Colon cancer dx August 2017 s/p partial colectomy. Did not receive adjuvant chemotherapy. Repeat imaging in February revealed hepatic lesions with biopsy showing metastatic disease. Started chemotherapy in April.   Pt flagged on MST due to weight loss and decreased appetite. Weight history is perplexing. She was 157 lbs at the time of initial surgery 1 year ago. She rapidly gained weight, peaking at 200 lbs in March. Her weight has plummeted back to 151 lbs over the course of the last 6 months.  Pt today reveals that during her hospitalization in August 2017, when she was diagnosed and underwent colectomy, she quit smoking. She had not been able to smoke the 9 days she was admitted and this helped her eliminate her dependence. She says she immediately started gaining weight afterward. Then in March, she was notified by her PCP that she was prediabetic, from then up until one month ago, she had been trying to lose weight as she feared becoming diabetic. She did this by reducing portion sizes and carb intake. She also quit unhealthy  habits such as frequently eating donuts.   RD asked if chemo had any affect on her appetite and she replied "no, it was a smoking and diabetes thing"/  As stated, patient stopped trying to lose weight 1 month ago. Now, she is eating 2 average meals a day. Her intake is significantly limited by dizziness, nausea (has nausea bag next to her in chemo) and vomiting. She says "sometimes I go two days without eating". Daughter states the patient does not eat because she is scared food/drink will worsen these symptoms. She drinks sprite, tea, water and coffee. She eats more vegetables than protein. She has some pain, but infrequently takers her hydrocodone due to fear of addiction. She does not take supplements.    Nutrition Focused Physical Exam: WDL  Medications:  Metformin, Coumadin, Zofran, compazine, oxaliplatin, Meclizine (started today), Hydrocodone, Vit C, omega 3s, Imodium   Recent Labs Lab 07/14/17 0929  NA 137  K 3.5  CL 100*  CO2 26  BUN 11  CREATININE 0.76  CALCIUM 8.9  GLUCOSE 115*  Labs: Reviewed. BGs appear fairly well controlled  Weights  Wt Readings from Last 10 Encounters:  07/14/17 151 lb 3.2 oz (68.6 kg)  06/30/17 160 lb (72.6 kg)  06/30/17 160 lb (72.6 kg)  06/15/17 169 lb (76.7 kg)  06/02/17 169 lb 9.6 oz (76.9 kg)  05/19/17 176 lb 3.2 oz (79.9 kg)  05/05/17 177 lb 6.4 oz (80.5 kg)  04/21/17 180 lb 6.4 oz (81.8 kg)  04/07/17 185 lb 9.6 oz (84.2 kg)  03/24/17 189 lb 6.4 oz (85.9 kg)   Anthropometrics:  Height: 5\' 2"  (157.48 cm) Weight: 151 lbs 3.2 oz (68.6 kg) BMI: 27.65-overweight  Estimated Energy Needs Kcals: 1700-1900 (25-28 kcal/kg bw) Protein: 70-80 g pro (1.4-1.6 g/kg ibw) Fluid: 1.7-1.9 L fluid (1 ml/kcal)  NUTRITION DIAGNOSIS: Inadequate oral intake related to nausea, vomiting, dizziness AEB the pateitns report.   MALNUTRITION DIAGNOSIS: N/A. Wt loss was largely intentional.   INTERVENTION:  Though it sounds that the majority of the patients weight loss was intentional, she still is exhibiting wt loss even though she has stopped trying. From dietary recall and daughters report, she does not sound to be eating enough and this is largely due to uncontrolled nausea, vomiting and dizziness that is worsened with PO intake.   RD spent  some time discussing why wt loss during cancer treatment is not ideal and why it is crucial that she consume enough calories and protein to maintain weight. We reviewed protein sources and RD asked her to eat her protein FIRST at each meal, then she can eat her vegetables. She also is asked to try to break her habit of only consuming 2 meals/day. Encouraged to incorporate high protein snacks throughout day. Examples given are cheese with fruit or PB w/ crackers. She likes both  of these.   Reviewed how supplements may be of use to her. She says she tried Ensure when her husband had cancer and she does not like it. RD gave coupons and asked her to try some varieties she has not yet tasted; it will benefit her to find a supplement she likes for times she feels very ill and is unable to eat much.   She says she is not a "pill popper" and is not to crazy about the idea of premedicating before eating. RD understands her dislike of taking large amounts of medications, but she is experiencing continued weight loss and she needs to do all she can to increase her intake. With some encouragement, she says she will start taking anti nausea/vertigo medications before eating.   RD gave handout titled "Making the Most of Each Bite". Discussed appropriate eating habits such as never eating a food by itself. She should add toppings, condiments and heavy dressings to her salads/sandwiches. SHe should use full fat mayo and avoid reduced kcal versions of items.   RD advised that he will monitor her and follow up as needed.   MONITORING, EVALUATION, GOAL: Weight, symptom control  NEXT VISIT: Will monitor and follow up as needed.   Burtis Junes RD, LDN, CNSC Clinical Nutrition Pager: [phone] 07/14/2017 12:10 PM

## 2017-07-14 NOTE — Patient Instructions (Signed)
[facility] Discharge Instructions for Patients Receiving Chemotherapy  Today you received the following chemotherapy agents Avastin, Oxaliplatin, leucovorin, and 5FU with pump.   To help prevent nausea and vomiting after your treatment, we encourage you to take your nausea medication.    If you develop nausea and vomiting that is not controlled by your nausea medication, call the clinic.   BELOW ARE SYMPTOMS THAT SHOULD BE REPORTED IMMEDIATELY:  *FEVER GREATER THAN 100.5 F  *CHILLS WITH OR WITHOUT FEVER  NAUSEA AND VOMITING THAT IS NOT CONTROLLED WITH YOUR NAUSEA MEDICATION  *UNUSUAL SHORTNESS OF BREATH  *UNUSUAL BRUISING OR BLEEDING  TENDERNESS IN MOUTH AND THROAT WITH OR WITHOUT PRESENCE OF ULCERS  *URINARY PROBLEMS  *BOWEL PROBLEMS  UNUSUAL RASH Items with * indicate a potential emergency and should be followed up as soon as possible.  Feel free to call the clinic you have any questions or concerns. The clinic phone number is [phone].  Please show the [facility] at check-in to the Emergency Department and triage nurse.

## 2017-07-16 ENCOUNTER — Encounter (HOSPITAL_COMMUNITY): Payer: Self-pay

## 2017-07-16 ENCOUNTER — Encounter (HOSPITAL_BASED_OUTPATIENT_CLINIC_OR_DEPARTMENT_OTHER): Payer: Medicare Other

## 2017-07-16 VITALS — BP 137/97 | HR 113 | Resp 18

## 2017-07-16 DIAGNOSIS — C787 Secondary malignant neoplasm of liver and intrahepatic bile duct: Secondary | ICD-10-CM

## 2017-07-16 DIAGNOSIS — C182 Malignant neoplasm of ascending colon: Secondary | ICD-10-CM

## 2017-07-16 DIAGNOSIS — C189 Malignant neoplasm of colon, unspecified: Secondary | ICD-10-CM

## 2017-07-16 MED ORDER — HEPARIN SOD (PORK) LOCK FLUSH 100 UNIT/ML IV SOLN
500.0000 [IU] | Freq: Once | INTRAVENOUS | Status: AC | PRN
  Administered 2017-07-16: 500 [IU]
  Filled 2017-07-16: qty 5

## 2017-07-16 MED ORDER — PROCHLORPERAZINE MALEATE 10 MG PO TABS: 10.0000 mg | ORAL_TABLET | Freq: Four times a day (QID) | ORAL | 1 refills | Status: AC | PRN

## 2017-07-16 MED ORDER — SODIUM CHLORIDE 0.9% FLUSH
10.0000 mL | INTRAVENOUS | Status: DC | PRN
  Administered 2017-07-16: 10 mL
  Filled 2017-07-16: qty 10

## 2017-07-16 NOTE — Progress Notes (Signed)
Pt here today for chemo pump removal. Pt states that she is still having nausea and vomiting at times but has noticed it is worse when she rides in the car with someone else driving. Pt continues taking her nausea medications as prescribed. Pt's port flushed and tolerated well. Pt stable and discharged home ambulatory with family.

## 2017-07-28 ENCOUNTER — Encounter (HOSPITAL_COMMUNITY): Payer: Self-pay

## 2017-07-28 ENCOUNTER — Encounter (HOSPITAL_BASED_OUTPATIENT_CLINIC_OR_DEPARTMENT_OTHER): Payer: Medicare Other

## 2017-07-28 VITALS — BP 117/93 | HR 113 | Temp 98.3°F | Resp 16 | Wt 149.6 lb

## 2017-07-28 DIAGNOSIS — Z5111 Encounter for antineoplastic chemotherapy: Secondary | ICD-10-CM | POA: Diagnosis not present

## 2017-07-28 DIAGNOSIS — E876 Hypokalemia: Secondary | ICD-10-CM

## 2017-07-28 DIAGNOSIS — Z5112 Encounter for antineoplastic immunotherapy: Secondary | ICD-10-CM | POA: Diagnosis present

## 2017-07-28 DIAGNOSIS — C182 Malignant neoplasm of ascending colon: Secondary | ICD-10-CM | POA: Diagnosis not present

## 2017-07-28 LAB — COMPREHENSIVE METABOLIC PANEL
ALBUMIN: 3.5 g/dL (ref 3.5–5.0)
ALT: 13 U/L — ABNORMAL LOW (ref 14–54)
ANION GAP: 8 (ref 5–15)
AST: 19 U/L (ref 15–41)
Alkaline Phosphatase: 62 U/L (ref 38–126)
BUN: 9 mg/dL (ref 6–20)
CHLORIDE: 101 mmol/L (ref 101–111)
CO2: 29 mmol/L (ref 22–32)
Calcium: 8.9 mg/dL (ref 8.9–10.3)
Creatinine, Ser: 0.59 mg/dL (ref 0.44–1.00)
GFR calc Af Amer: >60 mL/min (ref >60)
GFR calc non Af Amer: >60 mL/min (ref >60)
GLUCOSE: 101 mg/dL — AB (ref 65–99)
POTASSIUM: 2.9 mmol/L — AB (ref 3.5–5.1)
Sodium: 138 mmol/L (ref 135–145)
Total Bilirubin: 0.6 mg/dL (ref 0.3–1.2)
Total Protein: 6.5 g/dL (ref 6.5–8.1)

## 2017-07-28 LAB — URINALYSIS, DIPSTICK ONLY
BILIRUBIN URINE: NEGATIVE
Glucose, UA: NEGATIVE mg/dL
Hgb urine dipstick: NEGATIVE
KETONES UR: NEGATIVE mg/dL
NITRITE: NEGATIVE
PH: 6 (ref 5.0–8.0)
PROTEIN: NEGATIVE mg/dL
Specific Gravity, Urine: 1.008 (ref 1.005–1.030)

## 2017-07-28 LAB — CBC WITH DIFFERENTIAL/PLATELET
BASOS PCT: 0 %
Basophils Absolute: 0 10*3/uL (ref 0.0–0.1)
EOS ABS: 0.1 10*3/uL (ref 0.0–0.7)
Eosinophils Relative: 1 %
HCT: 33.2 % — ABNORMAL LOW (ref 36.0–46.0)
HEMOGLOBIN: 10.7 g/dL — AB (ref 12.0–15.0)
Lymphocytes Relative: 38 %
Lymphs Abs: 1.6 10*3/uL (ref 0.7–4.0)
MCH: 32.6 pg (ref 26.0–34.0)
MCHC: 32.2 g/dL (ref 30.0–36.0)
MCV: 101.2 fL — ABNORMAL HIGH (ref 78.0–100.0)
MONOS PCT: 12 %
Monocytes Absolute: 0.5 10*3/uL (ref 0.1–1.0)
NEUTROS PCT: 49 %
Neutro Abs: 2.1 10*3/uL (ref 1.7–7.7)
Platelets: 155 10*3/uL (ref 150–400)
RBC: 3.28 MIL/uL — ABNORMAL LOW (ref 3.87–5.11)
RDW: 14.8 % (ref 11.5–15.5)
WBC: 4.3 10*3/uL (ref 4.0–10.5)

## 2017-07-28 MED ORDER — DEXTROSE 5 % IV SOLN
Freq: Once | INTRAVENOUS | Status: AC
  Administered 2017-07-28: 11:00:00 via INTRAVENOUS

## 2017-07-28 MED ORDER — DEXAMETHASONE SODIUM PHOSPHATE 10 MG/ML IJ SOLN
10.0000 mg | Freq: Once | INTRAMUSCULAR | Status: AC
  Administered 2017-07-28: 10 mg via INTRAVENOUS
  Filled 2017-07-28: qty 1

## 2017-07-28 MED ORDER — POTASSIUM CHLORIDE CRYS ER 20 MEQ PO TBCR
60.0000 meq | EXTENDED_RELEASE_TABLET | Freq: Two times a day (BID) | ORAL | Status: DC
  Administered 2017-07-28: 60 meq via ORAL
  Filled 2017-07-28: qty 3

## 2017-07-28 MED ORDER — SODIUM CHLORIDE 0.9% FLUSH
10.0000 mL | INTRAVENOUS | Status: DC | PRN
  Administered 2017-07-28: 10 mL
  Filled 2017-07-28: qty 10

## 2017-07-28 MED ORDER — HYDROCODONE-ACETAMINOPHEN 5-325 MG PO TABS: 1.0000 | ORAL_TABLET | Freq: Four times a day (QID) | ORAL | 0 refills | Status: DC | PRN

## 2017-07-28 MED ORDER — SODIUM CHLORIDE 0.9 % IV SOLN
INTRAVENOUS | Status: DC
  Administered 2017-07-28: 14:00:00 via INTRAVENOUS

## 2017-07-28 MED ORDER — LEUCOVORIN CALCIUM INJECTION 350 MG
405.0000 mg/m2 | Freq: Once | INTRAVENOUS | Status: AC
  Administered 2017-07-28: 700 mg via INTRAVENOUS
  Filled 2017-07-28: qty 35

## 2017-07-28 MED ORDER — FLUOROURACIL CHEMO INJECTION 2.5 GM/50ML
400.0000 mg/m2 | Freq: Once | INTRAVENOUS | Status: AC
  Administered 2017-07-28: 700 mg via INTRAVENOUS
  Filled 2017-07-28: qty 14

## 2017-07-28 MED ORDER — PALONOSETRON HCL INJECTION 0.25 MG/5ML
0.2500 mg | Freq: Once | INTRAVENOUS | Status: AC
  Administered 2017-07-28: 0.25 mg via INTRAVENOUS
  Filled 2017-07-28: qty 5

## 2017-07-28 MED ORDER — DEXTROSE 5 % IV SOLN
58.0000 mg/m2 | Freq: Once | INTRAVENOUS | Status: AC
  Administered 2017-07-28: 100 mg via INTRAVENOUS
  Filled 2017-07-28: qty 20

## 2017-07-28 MED ORDER — SODIUM CHLORIDE 0.9 % IV SOLN
2400.0000 mg/m2 | INTRAVENOUS | Status: DC
  Administered 2017-07-28: 4150 mg via INTRAVENOUS
  Filled 2017-07-28: qty 83

## 2017-07-28 MED ORDER — SODIUM CHLORIDE 0.9 % IV SOLN
5.0000 mg/kg | Freq: Once | INTRAVENOUS | Status: AC
  Administered 2017-07-28: 350 mg via INTRAVENOUS
  Filled 2017-07-28: qty 14

## 2017-07-28 NOTE — Progress Notes (Signed)
Janet Hanson tolerated chemo tx well without complaints or incident. Labs,urine protein and worsening neuropathy reviewed with Dr. Talbert Cage prior to administering chemotherapy. Oxaliplatin dose reduced by MD and Potassium 60 meq po ordered per MD. Pt discharged with 5FU pump infusing without issues. VSS upon discharge. Pt discharged self ambulatory in satisfactory condition accompanied by her daughter

## 2017-07-28 NOTE — Patient Instructions (Signed)
[facility] Discharge Instructions for Patients Receiving Chemotherapy   Beginning January 23rd 2017 lab work for the Cancer Center will be done in the  Main lab at [facility] on 1st floor. If you have a lab appointment with the Cancer Center please come in thru the  Main Entrance and check in at the main information desk   Today you received the following chemotherapy agents Oxaliplatin,Leucovorin,5FU and Avastin. Follow-up as scheduled. Call clinic for any questions or concerns  To help prevent nausea and vomiting after your treatment, we encourage you to take your nausea medication   If you develop nausea and vomiting, or diarrhea that is not controlled by your medication, call the clinic.  The clinic phone number is [phone]. Office hours are Monday-Friday 8:30am-5:00pm.  BELOW ARE SYMPTOMS THAT SHOULD BE REPORTED IMMEDIATELY:  *FEVER GREATER THAN 101.0 F  *CHILLS WITH OR WITHOUT FEVER  NAUSEA AND VOMITING THAT IS NOT CONTROLLED WITH YOUR NAUSEA MEDICATION  *UNUSUAL SHORTNESS OF BREATH  *UNUSUAL BRUISING OR BLEEDING  TENDERNESS IN MOUTH AND THROAT WITH OR WITHOUT PRESENCE OF ULCERS  *URINARY PROBLEMS  *BOWEL PROBLEMS  UNUSUAL RASH Items with * indicate a potential emergency and should be followed up as soon as possible. If you have an emergency after office hours please contact your primary care physician or go to the nearest emergency department.  Please call the clinic during office hours if you have any questions or concerns.   You may also contact the Patient Navigator at [phone] should you have any questions or need assistance in obtaining follow up care.      Resources For Cancer Patients and their Caregivers ? [employer]: Can assist with transportation, wigs, general needs, runs Look Good Feel Better.        1-888-227-6333 ? Cancer Care: Provides financial assistance, online support groups, medication/co-pay  assistance.  1-800-813-HOPE (4673) ? [facility] Assists [place] cancer patients and their families through emotional , educational and financial support.  336-427-4357 ? [place] DSS Where to apply for food stamps, Medicaid and utility assistance. 336-342-1394 ? [facility]: Transportation to medical appointments. 336-347-2287 ? Social Security Administration: May apply for disability if have a Stage IV cancer. 336-342-7796 1-800-772-1213 ? [place] [facility]: Assists with nutrition, care and transit needs. 336-349-2343         

## 2017-07-29 LAB — CEA: CEA1: 7.9 ng/mL — AB (ref 0.0–4.7)

## 2017-07-30 ENCOUNTER — Encounter (HOSPITAL_BASED_OUTPATIENT_CLINIC_OR_DEPARTMENT_OTHER): Payer: Medicare Other

## 2017-07-30 ENCOUNTER — Encounter (HOSPITAL_COMMUNITY): Payer: Self-pay

## 2017-07-30 VITALS — BP 132/87 | HR 110 | Temp 98.2°F | Resp 16

## 2017-07-30 DIAGNOSIS — Z452 Encounter for adjustment and management of vascular access device: Secondary | ICD-10-CM

## 2017-07-30 DIAGNOSIS — C182 Malignant neoplasm of ascending colon: Secondary | ICD-10-CM

## 2017-07-30 MED ORDER — HEPARIN SOD (PORK) LOCK FLUSH 100 UNIT/ML IV SOLN
500.0000 [IU] | Freq: Once | INTRAVENOUS | Status: AC | PRN
  Administered 2017-07-30: 500 [IU]

## 2017-07-30 MED ORDER — SODIUM CHLORIDE 0.9% FLUSH
10.0000 mL | INTRAVENOUS | Status: DC | PRN
  Administered 2017-07-30: 10 mL
  Filled 2017-07-30: qty 10

## 2017-07-30 NOTE — Progress Notes (Signed)
Janet Hanson returns today for port de access and flush after 46 hr continous infusion of 75fu. Tolerated infusion without problems. Portacath located rightchest wall was  deaccessed and flushed with 84ml NS and 500U/46ml Heparin and needle removed intact.  Procedure without incident. Patient tolerated procedure well.  Treatment given per orders. Patient tolerated it well without problems. Vitals stable and discharged home from clinic ambulatory. Follow up as scheduled.

## 2017-08-11 ENCOUNTER — Encounter (HOSPITAL_COMMUNITY): Payer: Medicare Other | Attending: Hematology & Oncology

## 2017-08-11 ENCOUNTER — Encounter (HOSPITAL_BASED_OUTPATIENT_CLINIC_OR_DEPARTMENT_OTHER): Payer: Medicare Other | Admitting: Oncology

## 2017-08-11 ENCOUNTER — Other Ambulatory Visit (HOSPITAL_COMMUNITY): Payer: Self-pay | Admitting: Lab

## 2017-08-11 ENCOUNTER — Encounter (HOSPITAL_COMMUNITY): Payer: Self-pay

## 2017-08-11 VITALS — BP 140/86 | HR 89 | Temp 97.9°F | Resp 16 | Wt 153.2 lb

## 2017-08-11 DIAGNOSIS — C182 Malignant neoplasm of ascending colon: Secondary | ICD-10-CM

## 2017-08-11 DIAGNOSIS — Z87891 Personal history of nicotine dependence: Secondary | ICD-10-CM | POA: Insufficient documentation

## 2017-08-11 DIAGNOSIS — Z79899 Other long term (current) drug therapy: Secondary | ICD-10-CM | POA: Insufficient documentation

## 2017-08-11 DIAGNOSIS — C787 Secondary malignant neoplasm of liver and intrahepatic bile duct: Secondary | ICD-10-CM

## 2017-08-11 DIAGNOSIS — Z5111 Encounter for antineoplastic chemotherapy: Secondary | ICD-10-CM

## 2017-08-11 DIAGNOSIS — D500 Iron deficiency anemia secondary to blood loss (chronic): Secondary | ICD-10-CM | POA: Diagnosis present

## 2017-08-11 DIAGNOSIS — Z5112 Encounter for antineoplastic immunotherapy: Secondary | ICD-10-CM

## 2017-08-11 LAB — COMPREHENSIVE METABOLIC PANEL
ALBUMIN: 3.3 g/dL — AB (ref 3.5–5.0)
ALK PHOS: 65 U/L (ref 38–126)
ALT: 16 U/L (ref 14–54)
AST: 30 U/L (ref 15–41)
Anion gap: 10 (ref 5–15)
BUN: 11 mg/dL (ref 6–20)
CALCIUM: 8.4 mg/dL — AB (ref 8.9–10.3)
CO2: 23 mmol/L (ref 22–32)
CREATININE: 0.63 mg/dL (ref 0.44–1.00)
Chloride: 107 mmol/L (ref 101–111)
GFR calc Af Amer: >60 mL/min (ref >60)
GFR calc non Af Amer: >60 mL/min (ref >60)
GLUCOSE: 91 mg/dL (ref 65–99)
Potassium: 3.5 mmol/L (ref 3.5–5.1)
SODIUM: 140 mmol/L (ref 135–145)
Total Bilirubin: 0.6 mg/dL (ref 0.3–1.2)
Total Protein: 5.9 g/dL — ABNORMAL LOW (ref 6.5–8.1)

## 2017-08-11 LAB — CBC WITH DIFFERENTIAL/PLATELET
BASOS PCT: 1 %
Basophils Absolute: 0 10*3/uL (ref 0.0–0.1)
EOS ABS: 0 10*3/uL (ref 0.0–0.7)
Eosinophils Relative: 1 %
HCT: 32.4 % — ABNORMAL LOW (ref 36.0–46.0)
HEMOGLOBIN: 10.1 g/dL — AB (ref 12.0–15.0)
LYMPHS ABS: 1.7 10*3/uL (ref 0.7–4.0)
Lymphocytes Relative: 52 %
MCH: 32.4 pg (ref 26.0–34.0)
MCHC: 31.2 g/dL (ref 30.0–36.0)
MCV: 103.8 fL — ABNORMAL HIGH (ref 78.0–100.0)
Monocytes Absolute: 0.4 10*3/uL (ref 0.1–1.0)
Monocytes Relative: 13 %
NEUTROS ABS: 1.1 10*3/uL — AB (ref 1.7–7.7)
NEUTROS PCT: 33 %
Platelets: 111 10*3/uL — ABNORMAL LOW (ref 150–400)
RBC: 3.12 MIL/uL — AB (ref 3.87–5.11)
RDW: 16.1 % — ABNORMAL HIGH (ref 11.5–15.5)
WBC: 3.3 10*3/uL — AB (ref 4.0–10.5)

## 2017-08-11 LAB — URINALYSIS, DIPSTICK ONLY
BILIRUBIN URINE: NEGATIVE
GLUCOSE, UA: NEGATIVE mg/dL
HGB URINE DIPSTICK: NEGATIVE
NITRITE: NEGATIVE
PH: 6 (ref 5.0–8.0)
Specific Gravity, Urine: >1.03 — ABNORMAL HIGH (ref 1.005–1.030)

## 2017-08-11 MED ORDER — FLUOROURACIL CHEMO INJECTION 2.5 GM/50ML
400.0000 mg/m2 | Freq: Once | INTRAVENOUS | Status: AC
  Administered 2017-08-11: 700 mg via INTRAVENOUS
  Filled 2017-08-11: qty 14

## 2017-08-11 MED ORDER — DEXAMETHASONE SODIUM PHOSPHATE 10 MG/ML IJ SOLN
INTRAMUSCULAR | Status: AC
  Filled 2017-08-11: qty 1

## 2017-08-11 MED ORDER — SODIUM CHLORIDE 0.9 % IV SOLN
INTRAVENOUS | Status: DC
  Administered 2017-08-11: 500 mL via INTRAVENOUS

## 2017-08-11 MED ORDER — OXALIPLATIN CHEMO INJECTION 100 MG/20ML
58.7500 mg/m2 | Freq: Once | INTRAVENOUS | Status: AC
  Administered 2017-08-11: 100 mg via INTRAVENOUS
  Filled 2017-08-11: qty 20

## 2017-08-11 MED ORDER — DEXAMETHASONE SODIUM PHOSPHATE 10 MG/ML IJ SOLN
10.0000 mg | Freq: Once | INTRAMUSCULAR | Status: AC
  Administered 2017-08-11: 10 mg via INTRAVENOUS

## 2017-08-11 MED ORDER — SODIUM CHLORIDE 0.9 % IV SOLN
2400.0000 mg/m2 | INTRAVENOUS | Status: DC
  Administered 2017-08-11: 4150 mg via INTRAVENOUS
  Filled 2017-08-11: qty 83

## 2017-08-11 MED ORDER — SODIUM CHLORIDE 0.9% FLUSH
10.0000 mL | INTRAVENOUS | Status: DC | PRN
  Administered 2017-08-11: 10 mL
  Filled 2017-08-11: qty 10

## 2017-08-11 MED ORDER — LEUCOVORIN CALCIUM INJECTION 350 MG
405.0000 mg/m2 | Freq: Once | INTRAVENOUS | Status: AC
  Administered 2017-08-11: 700 mg via INTRAVENOUS
  Filled 2017-08-11: qty 35

## 2017-08-11 MED ORDER — DEXTROSE 5 % IV SOLN
Freq: Once | INTRAVENOUS | Status: AC
  Administered 2017-08-11: 12:00:00 via INTRAVENOUS

## 2017-08-11 MED ORDER — BEVACIZUMAB CHEMO INJECTION 400 MG/16ML
5.0000 mg/kg | Freq: Once | INTRAVENOUS | Status: AC
  Administered 2017-08-11: 350 mg via INTRAVENOUS
  Filled 2017-08-11: qty 14

## 2017-08-11 MED ORDER — PALONOSETRON HCL INJECTION 0.25 MG/5ML
0.2500 mg | Freq: Once | INTRAVENOUS | Status: AC
  Administered 2017-08-11: 0.25 mg via INTRAVENOUS

## 2017-08-11 MED ORDER — PALONOSETRON HCL INJECTION 0.25 MG/5ML
INTRAVENOUS | Status: AC
  Filled 2017-08-11: qty 5

## 2017-08-11 NOTE — Progress Notes (Signed)
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 There is a small adjacent foci of gas and free fluid raising the possibility of micro perforation. There is marked dilatation of the cecum and small bowel with fluid and gas most compatible with proximal obstruction caused by the large ascending colonic mass. Additionally there is a 6 cm fatty mass involving the colon at the level the hepatic flexure, most suggestive of a lipoma. Aortic vascular calcifications. Sigmoid colonic diverticulosis without evidence for acute diverticulitis.No specific findings identified to suggest metastatic disease to the chest.There is a 3 mm nodule identified within the left upper lobe, nonspecific.Bilateral pleural effusions with overlying compressive type atelectasis and consolidation       06/26/2016 Tumor Marker    CEA 39.3 ng/ml      06/27/2016 Surgery    Partial colectomy with primary linear stapled anastomosis.  Large bulky mid-ascending colonic mass extending into the colonic mesentery and retroperitoneum with a layer of not visibly/palpably involved fat between the colon and underlying visualized and exposed duodenum and Right kidney      06/27/2016 Pathology Results    COLONIC ADENOCARCINOMA (8.5 CM), GRADE 2 THE TUMOR INVADES THROUGH THE MUSCULARIS PROPRIA INTO PERICOLONIC TISSUE  ALL MARGINS OF RESECTION ARE NEGATIVE FOR CARCINOMA FOURTEEN BENIGN LYMPH NODES (0/14) TUBULAR ADENOMA  (X3) UNREMARKABLE APPENDIX      06/30/2016 Imaging    CT angio chest Positive examination for right upper lobe and right lower lobe pulmonary emboli. No evidence of right heart strain. Again demonstrated are bilateral pleural effusions and basilar atelectasis, greater on the right.      07/30/2016 Tumor Marker      Ref. Range 07/30/2016 16:46  CEA Latest Ref Range: 0.0 - 4.7 ng/mL 4.0        07/30/2016 Miscellaneous    Medical Oncology consultation.  No adjuvant chemotherapy needed based upon risk factors and small benefit.  Informed decision made by patient to not pursue systemic chemotherapy in adjuvant setting.      11/05/2016 Procedure    Colonoscopy by Dr. Laural Golden.  soft anal tags found on digital rectal exam. - Patent end-to-side colo-colonic anastomosis, characterized by healthy appearing mucosa. - Three 5 to 7 mm polyps in the proximal transverse colon, removed with a cold snare. Resected and retrieved. - One 10 mm polyp in the proximal transverse colon, removed with a hot snare. Resected and retrieved. - One 20 mm polyp in the proximal sigmoid colon, removed with a hot snare. Resected and retrieved. Clip (MR conditional) was placed. - One 15 to 25 mm polyp at the recto-sigmoid colon, removed with a hot snare. Resected and retrieved. Clip (MR conditional) was placed. - Diverticulosis in the sigmoid colon and in the descending colon. - The distal rectum and anal verge are normal on retroflexion view.      11/07/2016 Pathology Results    1. Colon, polyp(s), transverse proximal - TUBULAR ADENOMA(S). - HIGH GRADE DYSPLASIA IS NOT IDENTIFIED. 2. Colon, polyp(s), proximal  sigmoid - TUBULOVILLOUS ADENOMA WITH FOCAL HIGH GRADE DYSPLASIA (5%). 3. Rectosigmoid , polyp - TUBULOVILLOUS ADENOMA WITH HIGH GRADE DYSPLASIA (15%).      12/26/2016 Imaging    CT abd/pelvis-1. Interval development of a 2.4 cm rim enhancing lesion in the lateral segment left liver, highly suspicious for  metastatic disease. 2. New abnormal irregular soft tissue in the cul-de-sac, involving the anterior wall of the rectum and posterior cervix. This is highly suspicious for metastatic involvement (drop lesion). 3. Interval Right hemicolectomy. 4.  Abdominal Aortic Atherosclerois (ICD10-170.0)      01/16/2017 Procedure    IR biopsy of hepatic lesion      01/19/2017 Pathology Results    Liver, needle/core biopsy, Left Lobe - ADENOCARCINOMA Microscopic Comment The morphology is consistent with metastatic colorectal adenocarcinoma. There is sufficient tissue for additional studies. Called to [facility] on 01/19/17      02/06/2017 Imaging    CT chest- 1. Stable CT chest. No substantial change in the 7 mm short axis left paraesophageal lymph node. 2. Rim enhancing lesion lateral segment left liver has progressed in the interval      02/09/2017 Procedure    Port-A-Cath insertion by Dr. Arnoldo Morale      02/10/2017 -  Chemotherapy    The patient had palonosetron (ALOXI) injection 0.25 mg, 0.25 mg, Intravenous,  Once, 1 of 4 cycles  leucovorin 800 mg in dextrose 5 % 250 mL infusion, 404 mg/m2 = 792 mg, Intravenous,  Once, 1 of 4 cycles  oxaliplatin (ELOXATIN) 170 mg in dextrose 5 % 500 mL chemo infusion, 85 mg/m2 = 170 mg, Intravenous,  Once, 1 of 4 cycles  fluorouracil (ADRUCIL) chemo injection 800 mg, 400 mg/m2 = 800 mg, Intravenous,  Once, 1 of 4 cycles  fluorouracil (ADRUCIL) 4,750 mg in sodium chloride 0.9 % 55 mL chemo infusion, 2,400 mg/m2 = 4,750 mg, Intravenous, 1 Day/Dose, 1 of 4 cycles  for chemotherapy treatment.        03/11/2017 Pathology Results    FoundationONE- KRAS wildtype, NRAS wildtype, MS stable, TMB 6 Muts/Mb, APC Y301*, APC S010*, TERT promoter-124C>T, TP53 LOSS.  Therapies with clinical benefit include cetuximab, Panitumumab.      05/01/2017 Imaging    CT C/A/P: 1. The left hepatic metastasis is smaller in the interval. The soft tissue between the anterior wall of  the rectum and the posterior uterus is concerning for a drop metastasis, similar in the interval. No other metastatic disease is identified. 2. The ascending thoracic aorta measures 4.1 cm. Ascending thoracic aortic aneurysm. Recommend semi-annual imaging followup by CTA or MRA and referral to cardiothoracic surgery if not already obtained. This recommendation follows 2010 ACCF/AHA/AATS/ACR/ASA/SCA/SCAI/SIR/STS/SVM Guidelines for the Diagnosis and Management of Patients With Thoracic Aortic Disease. Circulation. 2010; 121: X323-F573 3. Atherosclerosis in the aorta. 4. Coronary artery calcifications.        HISTORY OF PRESENT ILLNESS:  (From Dr. Laverle Patter last note on 06/02/17)  Janet Hanson 71 y.o. female is here for follow up of stage IV, T3 N0 M1 ascending colonic adenocarcinoma with liver metastasis.  Avastin was added to FOLFOX starting with cycle 3 of chemo due to evidence of progression due to enlarging liver met.      INTERVAL HISTORY:  Janet Hanson 71 y.o. female returns for routine follow-up for metastatic colon cancer with her daughter.   She is due for cycle 12 FOLFOX/Avastin today. He states that she has been doing well since her last visit. Her dizziness is improved. She went  to the beach this past weekend and had a good time. She states that her appetite has improved. She denies any chest pain, shortness breath, abdominal pain, focal weakness. 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   REVIEW OF SYSTEMS:  Review of Systems - Oncology (per HPI) ROS as per HPI otherwise 12 point ROS is negative.  PAST MEDICAL/SURGICAL HISTORY:  Past Medical History:  Diagnosis Date  . Cancer of right colon ([place]) 07/30/2016  . Colon cancer ([place])   . Depression   . Hypercholesterolemia   . Hypertension   . Iron deficiency anemia due to chronic blood loss 07/30/2016  . Vitamin B12 deficiency 08/13/2016   Past Surgical History:    Procedure Laterality Date  . COLONOSCOPY N/A 11/05/2016   Procedure: COLONOSCOPY;  Surgeon: Rogene Houston, MD;  Location: [facility];  Service: Endoscopy;  Laterality: N/A;  1:20  . PARTIAL COLECTOMY N/A 06/27/2016   Procedure: PARTIAL COLECTOMY;  Surgeon: Vickie Epley, MD;  Location: [facility] ORS;  Service: General;  Laterality: N/A;  . PORTACATH PLACEMENT Right 02/09/2017   Procedure: INSERTION PORT-A-CATH;  Surgeon: Aviva Signs, MD;  Location: [facility] ORS;  Service: General;  Laterality: Right;  right subclavian     SOCIAL HISTORY:  Social History   Social History  . Marital status: Widowed    Spouse name: N/A  . Number of children: N/A  . Years of education: N/A   Occupational History  . Not on file.   Social History Main Topics  . Smoking status: Former Smoker    Packs/day: 0.50    Years: 48.00    Types: Cigarettes    Quit date: 06/24/2016  . Smokeless tobacco: Never Used     Comment: quit smoking Sept 2017  . Alcohol use No     Comment: glass wine  twice per year  . Drug use: No  . Sexual activity: No     Comment: widowed- 1 daughter   Other Topics Concern  . Not on file   Social History Narrative  . No narrative on file    FAMILY HISTORY:  Family History  Problem Relation Age of Onset  . Hypertension Mother   . Other Father        some type of "stomach ailment"  . Suicidality Brother   . Stomach cancer Maternal Aunt   . Brain cancer Brother     CURRENT MEDICATIONS:  Outpatient Encounter Prescriptions as of 08/11/2017  Medication Sig  . acetaminophen (TYLENOL) 325 MG tablet Take 325 mg by mouth every 6 (six) hours as needed for mild pain or moderate pain.  Marland Kitchen amLODipine (NORVASC) 5 MG tablet Take 5 mg by mouth daily.  . Ascorbic Acid (VITAMIN C) 1000 MG tablet Take 1,000 mg by mouth daily.  . busPIRone (BUSPAR) 15 MG tablet Take 15 mg by mouth 2 (two) times daily.   . carvedilol (COREG) 3.125 MG tablet   . cyclobenzaprine (FLEXERIL) 10 MG tablet Take 10 mg  by mouth 3 (three) times daily as needed for muscle spasms.  Marland Kitchen dexamethasone (DECADRON) 4 MG tablet Take 2 tablets (8 mg total) by mouth daily. Start the day after chemotherapy for 2 days. Take with food.  . escitalopram (LEXAPRO) 20 MG tablet Take 20 mg by mouth daily.   . fluorouracil CALGB 19417 in sodium chloride 0.9 % 150 mL Inject into the vein over 48 hr.  . HYDROcodone-acetaminophen (NORCO) 5-325 MG tablet Take 1  tablet by mouth every 6 (six) hours as needed for moderate pain.  Marland Kitchen LEUCOVORIN CALCIUM IV Inject into the vein. Every 2 weeks  . lidocaine-prilocaine (EMLA) cream Apply to affected area once  . Loperamide HCl (IMODIUM PO) Take 1 tablet by mouth daily as needed (for diarrhea).   . meclizine (ANTIVERT) 32 MG tablet Take 1 tablet (32 mg total) by mouth 3 (three) times daily as needed.  . metFORMIN (GLUCOPHAGE) 500 MG tablet Take 500 mg by mouth daily with breakfast.   . Omega-3 Fatty Acids (FISH OIL) 1200 MG CAPS Take 1,200 capsules by mouth daily.   . ondansetron (ZOFRAN) 8 MG tablet Take 1 tablet (8 mg total) by mouth 2 (two) times daily as needed for refractory nausea / vomiting. Start on day 3 after chemotherapy.  . OXALIPLATIN IV Inject into the vein. Every 2 weeks  . prochlorperazine (COMPAZINE) 10 MG tablet Take 1 tablet (10 mg total) by mouth every 6 (six) hours as needed (Nausea or vomiting).  . simvastatin (ZOCOR) 40 MG tablet Take 40 mg by mouth daily.  Marland Kitchen warfarin (COUMADIN) 1 MG tablet Take 1-5 mg by mouth See admin instructions. Takes 1 mg daily except 2.5 mg on Sundays and Thursdays.  Marland Kitchen warfarin (COUMADIN) 5 MG tablet Take 1-5 mg by mouth See admin instructions. Takes 1 mg daily except 2.5 mg on Sundays and Thursdays.   No facility-administered encounter medications on file as of 08/11/2017.     ALLERGIES:  No Known Allergies   PHYSICAL EXAM:  ECOG Performance status: 1  Vital signs blood pressure 121/86 pulse 88 respiratory 18 temperature 98.1 O2 sat  100%.   Physical Exam  Constitutional: She is well-developed, well-nourished, and in no distress. No distress.  HENT:  Head: Normocephalic and atraumatic.  Eyes: Conjunctivae are normal. No scleral icterus.  Neck: Normal range of motion. Neck supple.  Cardiovascular: Normal rate, regular rhythm and normal heart sounds.  Exam reveals no gallop and no friction rub.   No murmur heard. Pulmonary/Chest: Effort normal and breath sounds normal. No respiratory distress. She has no wheezes.  Abdominal: Soft. Bowel sounds are normal. There is no tenderness.  Musculoskeletal: Normal range of motion. She exhibits no edema or tenderness.  Lymphadenopathy:    She has no cervical adenopathy.  Neurological: She is alert.  Skin: Skin is warm and dry. No rash noted.  Psychiatric: Affect and judgment normal. She does not exhibit a depressed mood.  Nursing note and vitals reviewed.    LABORATORY DATA:  I have reviewed the labs as listed.  CBC    Component Value Date/Time   WBC 3.3 (L) 08/11/2017 0901   RBC 3.12 (L) 08/11/2017 0901   HGB 10.1 (L) 08/11/2017 0901   HCT 32.4 (L) 08/11/2017 0901   PLT PENDING 08/11/2017 0901   MCV 103.8 (H) 08/11/2017 0901   MCH 32.4 08/11/2017 0901   MCHC 31.2 08/11/2017 0901   RDW 16.1 (H) 08/11/2017 0901   LYMPHSABS 1.7 08/11/2017 0901   MONOABS 0.4 08/11/2017 0901   EOSABS 0.0 08/11/2017 0901   BASOSABS 0.0 08/11/2017 0901   CMP Latest Ref Rng & Units 07/28/2017 07/14/2017 06/30/2017  Glucose 65 - 99 mg/dL 101(H) 115(H) 152(H)  BUN 6 - 20 mg/dL 9 11 13   Creatinine 0.44 - 1.00 mg/dL 0.59 0.76 0.76  Sodium 135 - 145 mmol/L 138 137 139  Potassium 3.5 - 5.1 mmol/L 2.9(L) 3.5 3.1(L)  Chloride 101 - 111 mmol/L 101 100(L) 103  CO2 22 -  32 mmol/L 29 26 23   Calcium 8.9 - 10.3 mg/dL 8.9 8.9 8.9  Total Protein 6.5 - 8.1 g/dL 6.5 7.0 6.9  Total Bilirubin 0.3 - 1.2 mg/dL 0.6 0.7 0.7  Alkaline Phos 38 - 126 U/L 62 82 111  AST 15 - 41 U/L 19 20 29   ALT 14 - 54 U/L  13(L) 12(L) 19    PENDING LABS:    DIAGNOSTIC IMAGING:  *The following radiologic images and reports have been reviewed independently and agree with below findings.  CT chest/abd/pelvis: 05/01/17 CLINICAL DATA:  Re- stage colon cancer  EXAM: CT CHEST, ABDOMEN, AND PELVIS WITH CONTRAST  TECHNIQUE: Multidetector CT imaging of the chest, abdomen and pelvis was performed following the standard protocol during bolus administration of intravenous contrast.  CONTRAST:  143m ISOVUE-300 IOPAMIDOL (ISOVUE-300) INJECTION 61%  COMPARISON:  CT the abdomen and pelvis December 26, 2016  FINDINGS: CT CHEST FINDINGS  Cardiovascular: Coronary artery calcifications are seen. The ascending thoracic aorta measures 4.1 cm in AP dimension on series 2, image 25. Mild atherosclerotic changes are seen in the aorta. No dissection. The central pulmonary arteries are normal. No central filling defects on limited images.  Mediastinum/Nodes: No enlarged mediastinal, hilar, or axillary lymph nodes. Thyroid gland, trachea, and esophagus demonstrate no significant findings.  Lungs/Pleura: The central airways are normal no pneumothorax mild atelectasis in the lingula. No suspicious pulmonary nodules or masses. No focal infiltrates.  Musculoskeletal: See below  CT ABDOMEN PELVIS FINDINGS  Hepatobiliary: The metastatic lesion measures 15.3 mm today versus 23.8 mm previously. Focal fatty deposition adjacent to the falciform ligament is identified. No other focal liver lesions are seen. Hepatic steatosis is noted. The portal vein is patent. The gallbladder is normal.  Pancreas: Unremarkable. No pancreatic ductal dilatation or surrounding inflammatory changes.  Spleen: Normal in size without focal abnormality.  Adrenals/Urinary Tract: A few scattered cysts are seen in the right kidney. The adrenal glands and kidneys are otherwise normal. The ureters and bladder are  normal.  Stomach/Bowel: The stomach and small bowel are normal. Colonic diverticulosis is seen without diverticulitis. The patient is status post partial colectomy. Remainder of the colon is otherwise normal.  Vascular/Lymphatic: Atherosclerosis is seen in the non aneurysmal aorta. No suspicious adenopathy.  Reproductive: Uterus and bilateral adnexa are unremarkable.  Other: No free air free fluid. The soft tissue between the anterior rectum and the posterior uterus is again identified and suspicious for a drop metastasis as described on the previous study.  Musculoskeletal: No acute or significant osseous findings.  IMPRESSION: 1. The left hepatic metastasis is smaller in the interval. The soft tissue between the anterior wall of the rectum and the posterior uterus is concerning for a drop metastasis, similar in the interval. No other metastatic disease is identified. 2. The ascending thoracic aorta measures 4.1 cm. Ascending thoracic aortic aneurysm. Recommend semi-annual imaging followup by CTA or MRA and referral to cardiothoracic surgery if not already obtained. This recommendation follows 2010 ACCF/AHA/AATS/ACR/ASA/SCA/SCAI/SIR/STS/SVM Guidelines for the Diagnosis and Management of Patients With Thoracic Aortic Disease. Circulation. 2010; 121: [id number]. Atherosclerosis in the aorta. 4. Coronary artery calcifications. Aortic aneurysm NOS (ICD10-I71.9). Aortic Atherosclerosis (ICD10-I70.0).   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 CEA elevated at 39.3 at time of diagnosis. Underwent partial colectomy. She elected did not receive adjuvant chemo given possible risks and likely small benefit. Repeat CT imaging in 12/2016 revealed left  liver lesion and soft tissue lesion in the cul-de-sac, both concerning for metastatic disease. Liver lesion biopsied by IR and positive for metastatic disease.  Started FOLFOX chemotherapy on 02/10/17; Avastin added with cycle #3.  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   -Labs reviewed, proceed with cycle 12 FOLFOX/Avastin today. -Plan to repeat restaging scans after this cycle. Will hold all further therapy until we get results of her scans in 1 month. Then will decide the next plan of care depending on her scans.  Plan to see her in follow up in 4 weeks with repeat labs and imaging.  Orders Placed This Encounter  Procedures  . CT Abdomen Pelvis W Contrast    Standing Status:   Future    Standing Expiration Date:   08/11/2018    Order Specific Question:   If indicated for the ordered procedure, I authorize the administration of contrast media per Radiology protocol    Answer:   Yes    Order Specific Question:   Preferred imaging location?    Answer:   [facility]    Order Specific Question:   Radiology Contrast Protocol - do NOT remove file path    Answer:   \\[email or web address]  . CT Chest W Contrast    Standing Status:   Future    Standing Expiration Date:   08/11/2018    Order Specific Question:   If indicated for the ordered procedure, I authorize the administration of contrast media per Radiology protocol    Answer:   Yes    Order Specific Question:   Preferred imaging location?    Answer:   [facility]    Order Specific Question:   Radiology Contrast Protocol - do NOT remove file path    Answer:   \\[email or web address]  . CBC with Differential    Standing Status:   Future    Standing Expiration Date:   08/11/2018  . Comprehensive metabolic panel    Standing Status:   Future    Standing Expiration Date:   08/11/2018  . CEA    Standing Status:   Future     Standing Expiration Date:   08/11/2018  . Urinalysis, dipstick only    Standing Status:   Future    Number of Occurrences:   1    Standing Expiration Date:   08/11/2018      All questions were answered to patient's stated satisfaction. Encouraged patient to call with any new concerns or questions before her next visit to the cancer center and we can certain see her sooner, if needed.    Twana First, MD

## 2017-08-11 NOTE — Progress Notes (Signed)
To treatment area for chemo, labs, and oncology follow up visit.  Patient stated neuropathy is the same from last treatment but no worsening.  SOB with exertion and denied blood in urine or stool.  Family at side and denied pain for today's visit.   Reviewed labs with Dr. Talbert Cage of Absolute neutrophils 1.1 today and ok to treat today due to being her last cycle and upcoming scans per oncologist.    Patient tolerated chemotherapy with no complaints voiced.  Port site clean and dry with no bruising or swelling noted at site.  Chemotherapy 5FU pump infusing with no alarms.  VSS with discharge and left ambulatory with no s/s of distress noted.  Patient reminded when to return with understanding verbalized.

## 2017-08-11 NOTE — Patient Instructions (Signed)
[facility] Discharge Instructions for Patients Receiving Chemotherapy  Today you received the following chemotherapy agents avastin, oxaliplatin, leucovorin, and 5Fu pump.   To help prevent nausea and vomiting after your treatment, we encourage you to take your nausea medication.   If you develop nausea and vomiting that is not controlled by your nausea medication, call the clinic.   BELOW ARE SYMPTOMS THAT SHOULD BE REPORTED IMMEDIATELY:  *FEVER GREATER THAN 100.5 F  *CHILLS WITH OR WITHOUT FEVER  NAUSEA AND VOMITING THAT IS NOT CONTROLLED WITH YOUR NAUSEA MEDICATION  *UNUSUAL SHORTNESS OF BREATH  *UNUSUAL BRUISING OR BLEEDING  TENDERNESS IN MOUTH AND THROAT WITH OR WITHOUT PRESENCE OF ULCERS  *URINARY PROBLEMS  *BOWEL PROBLEMS  UNUSUAL RASH Items with * indicate a potential emergency and should be followed up as soon as possible.  Feel free to call the clinic should you have any questions or concerns. The clinic phone number is [phone].  Please show the [facility] at check-in to the Emergency Department and triage nurse.

## 2017-08-13 ENCOUNTER — Encounter (HOSPITAL_BASED_OUTPATIENT_CLINIC_OR_DEPARTMENT_OTHER): Payer: Medicare Other

## 2017-08-13 ENCOUNTER — Encounter (HOSPITAL_COMMUNITY): Payer: Self-pay

## 2017-08-13 VITALS — BP 141/83 | HR 99 | Temp 97.6°F | Resp 16

## 2017-08-13 DIAGNOSIS — Z452 Encounter for adjustment and management of vascular access device: Secondary | ICD-10-CM | POA: Diagnosis not present

## 2017-08-13 DIAGNOSIS — C182 Malignant neoplasm of ascending colon: Secondary | ICD-10-CM

## 2017-08-13 MED ORDER — SODIUM CHLORIDE 0.9% FLUSH
10.0000 mL | INTRAVENOUS | Status: DC | PRN
  Administered 2017-08-13: 10 mL
  Filled 2017-08-13: qty 10

## 2017-08-13 MED ORDER — HEPARIN SOD (PORK) LOCK FLUSH 100 UNIT/ML IV SOLN
500.0000 [IU] | Freq: Once | INTRAVENOUS | Status: AC | PRN
  Administered 2017-08-13: 500 [IU]

## 2017-08-13 NOTE — Patient Instructions (Signed)
[place] at [facility]  Discharge Instructions:  Your chemotherapy pump was discontinued today.  Keep all scheduled appointments and call for any questions or concerns.  _______________________________________________________________  Thank you for choosing [place] at [facility] to provide your oncology and hematology care.  To afford each patient quality time with our providers, please arrive at least 15 minutes before your scheduled appointment.  You need to re-schedule your appointment if you arrive 10 or more minutes late.  We strive to give you quality time with our providers, and arriving late affects you and other patients whose appointments are after yours.  Also, if you no show three or more times for appointments you may be dismissed from the clinic.  Again, thank you for choosing [facility] at [facility] hope is that these requests will allow you access to exceptional care and in a timely manner. _______________________________________________________________  If you have questions after your visit, please contact our office at [phone] between the hours of 8:30 a.m. and 5:00 p.m. Voicemails left after 4:30 p.m. will not be returned until the following business day. _______________________________________________________________  For prescription refill requests, have your pharmacy contact our office. _______________________________________________________________  Recommendations made by the consultant and any test results will be sent to your referring physician. _______________________________________________________________

## 2017-08-13 NOTE — Progress Notes (Signed)
Chemotherapy 5FU pump removed with no complaints voiced.  Port flushed per protocol.  Port site clean and dry with no bruising or swelling noted at site.  Band aid applied.  VSS with discharge and patient left ambulatory with no s/s of distress noted.

## 2017-09-04 ENCOUNTER — Ambulatory Visit (HOSPITAL_COMMUNITY)
Admission: RE | Admit: 2017-09-04 | Discharge: 2017-09-04 | Disposition: A | Discharge status: Home / Self Care | Payer: Medicare Other | Source: Ambulatory Visit | Attending: Oncology | Admitting: Oncology

## 2017-09-04 ENCOUNTER — Encounter (HOSPITAL_COMMUNITY): Payer: Self-pay

## 2017-09-04 DIAGNOSIS — M4856XA Collapsed vertebra, not elsewhere classified, lumbar region, initial encounter for fracture: Secondary | ICD-10-CM | POA: Diagnosis not present

## 2017-09-04 DIAGNOSIS — C182 Malignant neoplasm of ascending colon: Secondary | ICD-10-CM | POA: Diagnosis present

## 2017-09-04 DIAGNOSIS — C787 Secondary malignant neoplasm of liver and intrahepatic bile duct: Secondary | ICD-10-CM | POA: Insufficient documentation

## 2017-09-04 DIAGNOSIS — K5730 Diverticulosis of large intestine without perforation or abscess without bleeding: Secondary | ICD-10-CM | POA: Insufficient documentation

## 2017-09-04 MED ORDER — IOPAMIDOL (ISOVUE-300) INJECTION 61%
100.0000 mL | Freq: Once | INTRAVENOUS | Status: AC | PRN
  Administered 2017-09-04: 100 mL via INTRAVENOUS

## 2017-09-09 ENCOUNTER — Other Ambulatory Visit (HOSPITAL_COMMUNITY): Payer: Medicare Other

## 2017-09-09 ENCOUNTER — Ambulatory Visit (HOSPITAL_COMMUNITY): Payer: Medicare Other

## 2017-09-23 ENCOUNTER — Ambulatory Visit (HOSPITAL_COMMUNITY): Payer: Medicare Other

## 2017-09-23 ENCOUNTER — Other Ambulatory Visit (HOSPITAL_COMMUNITY): Payer: Medicare Other

## 2017-09-30 ENCOUNTER — Ambulatory Visit (HOSPITAL_COMMUNITY): Payer: Medicare Other

## 2017-09-30 ENCOUNTER — Other Ambulatory Visit (HOSPITAL_COMMUNITY): Payer: Medicare Other

## 2017-10-07 ENCOUNTER — Other Ambulatory Visit: Payer: Self-pay

## 2017-10-07 ENCOUNTER — Encounter (HOSPITAL_COMMUNITY): Payer: Medicare Other | Attending: Oncology

## 2017-10-07 ENCOUNTER — Encounter (HOSPITAL_COMMUNITY): Payer: Self-pay | Admitting: Oncology

## 2017-10-07 ENCOUNTER — Encounter (HOSPITAL_BASED_OUTPATIENT_CLINIC_OR_DEPARTMENT_OTHER): Payer: Medicare Other | Admitting: Oncology

## 2017-10-07 VITALS — BP 132/97 | HR 118 | Temp 98.0°F | Resp 18 | Ht 63.0 in | Wt 148.0 lb

## 2017-10-07 DIAGNOSIS — C182 Malignant neoplasm of ascending colon: Secondary | ICD-10-CM | POA: Insufficient documentation

## 2017-10-07 DIAGNOSIS — Z7984 Long term (current) use of oral hypoglycemic drugs: Secondary | ICD-10-CM | POA: Insufficient documentation

## 2017-10-07 DIAGNOSIS — C787 Secondary malignant neoplasm of liver and intrahepatic bile duct: Secondary | ICD-10-CM

## 2017-10-07 DIAGNOSIS — I10 Essential (primary) hypertension: Secondary | ICD-10-CM | POA: Insufficient documentation

## 2017-10-07 DIAGNOSIS — Z8249 Family history of ischemic heart disease and other diseases of the circulatory system: Secondary | ICD-10-CM | POA: Insufficient documentation

## 2017-10-07 DIAGNOSIS — Z808 Family history of malignant neoplasm of other organs or systems: Secondary | ICD-10-CM | POA: Diagnosis not present

## 2017-10-07 DIAGNOSIS — Z9049 Acquired absence of other specified parts of digestive tract: Secondary | ICD-10-CM | POA: Insufficient documentation

## 2017-10-07 DIAGNOSIS — Z87891 Personal history of nicotine dependence: Secondary | ICD-10-CM | POA: Diagnosis not present

## 2017-10-07 DIAGNOSIS — E7800 Pure hypercholesterolemia, unspecified: Secondary | ICD-10-CM | POA: Insufficient documentation

## 2017-10-07 DIAGNOSIS — Z7901 Long term (current) use of anticoagulants: Secondary | ICD-10-CM | POA: Insufficient documentation

## 2017-10-07 DIAGNOSIS — Z79899 Other long term (current) drug therapy: Secondary | ICD-10-CM | POA: Insufficient documentation

## 2017-10-07 DIAGNOSIS — G620 Drug-induced polyneuropathy: Secondary | ICD-10-CM | POA: Diagnosis not present

## 2017-10-07 DIAGNOSIS — F329 Major depressive disorder, single episode, unspecified: Secondary | ICD-10-CM | POA: Insufficient documentation

## 2017-10-07 DIAGNOSIS — E538 Deficiency of other specified B group vitamins: Secondary | ICD-10-CM | POA: Insufficient documentation

## 2017-10-07 LAB — COMPREHENSIVE METABOLIC PANEL
ALK PHOS: 76 U/L (ref 38–126)
ALT: 19 U/L (ref 14–54)
AST: 26 U/L (ref 15–41)
Albumin: 4 g/dL (ref 3.5–5.0)
Anion gap: 10 (ref 5–15)
BILIRUBIN TOTAL: 0.8 mg/dL (ref 0.3–1.2)
BUN: 11 mg/dL (ref 6–20)
CALCIUM: 9.6 mg/dL (ref 8.9–10.3)
CO2: 27 mmol/L (ref 22–32)
CREATININE: 0.66 mg/dL (ref 0.44–1.00)
Chloride: 99 mmol/L — ABNORMAL LOW (ref 101–111)
GFR calc Af Amer: >60 mL/min (ref >60)
Glucose, Bld: 109 mg/dL — ABNORMAL HIGH (ref 65–99)
POTASSIUM: 5 mmol/L (ref 3.5–5.1)
Sodium: 136 mmol/L (ref 135–145)
TOTAL PROTEIN: 8 g/dL (ref 6.5–8.1)

## 2017-10-07 LAB — CBC WITH DIFFERENTIAL/PLATELET
BASOS ABS: 0 10*3/uL (ref 0.0–0.1)
Basophils Relative: 0 %
Eosinophils Absolute: 0.2 10*3/uL (ref 0.0–0.7)
Eosinophils Relative: 2 %
HEMATOCRIT: 38.3 % (ref 36.0–46.0)
HEMOGLOBIN: 11.6 g/dL — AB (ref 12.0–15.0)
LYMPHS PCT: 29 %
Lymphs Abs: 2.8 10*3/uL (ref 0.7–4.0)
MCH: 31 pg (ref 26.0–34.0)
MCHC: 30.3 g/dL (ref 30.0–36.0)
MCV: 102.4 fL — AB (ref 78.0–100.0)
MONO ABS: 0.7 10*3/uL (ref 0.1–1.0)
Monocytes Relative: 8 %
NEUTROS ABS: 6 10*3/uL (ref 1.7–7.7)
Neutrophils Relative %: 61 %
Platelets: 282 10*3/uL (ref 150–400)
RBC: 3.74 MIL/uL — ABNORMAL LOW (ref 3.87–5.11)
RDW: 14.6 % (ref 11.5–15.5)
WBC: 9.7 10*3/uL (ref 4.0–10.5)

## 2017-10-07 MED ORDER — HYDROCODONE-ACETAMINOPHEN 5-325 MG PO TABS: 1.0000 | ORAL_TABLET | Freq: Four times a day (QID) | ORAL | 0 refills | Status: DC | PRN

## 2017-10-07 NOTE — Progress Notes (Signed)
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 There is a small adjacent foci of gas and free fluid raising the possibility of micro perforation. There is marked dilatation of the cecum and small bowel with fluid and gas most compatible with proximal obstruction caused by the large ascending colonic mass. Additionally there is a 6 cm fatty mass involving the colon at the level the hepatic flexure, most suggestive of a lipoma. Aortic vascular calcifications. Sigmoid colonic diverticulosis without evidence for acute diverticulitis.No specific findings identified to suggest metastatic disease to the chest.There is a 3 mm nodule identified within the left upper lobe, nonspecific.Bilateral pleural effusions with overlying compressive type atelectasis and consolidation       06/26/2016 Tumor Marker    CEA 39.3 ng/ml      06/27/2016 Surgery    Partial colectomy with primary linear stapled anastomosis.  Large bulky mid-ascending colonic mass extending into the colonic mesentery and retroperitoneum with a layer of not visibly/palpably involved fat between the colon and underlying visualized and exposed duodenum and Right kidney      06/27/2016 Pathology Results    COLONIC ADENOCARCINOMA (8.5 CM), GRADE 2 THE TUMOR INVADES THROUGH THE MUSCULARIS PROPRIA INTO PERICOLONIC TISSUE  ALL MARGINS OF RESECTION ARE NEGATIVE FOR CARCINOMA FOURTEEN BENIGN LYMPH NODES (0/14) TUBULAR ADENOMA  (X3) UNREMARKABLE APPENDIX      06/30/2016 Imaging    CT angio chest Positive examination for right upper lobe and right lower lobe pulmonary emboli. No evidence of right heart strain. Again demonstrated are bilateral pleural effusions and basilar atelectasis, greater on the right.      07/30/2016 Tumor Marker      Ref. Range 07/30/2016 16:46  CEA Latest Ref Range: 0.0 - 4.7 ng/mL 4.0        07/30/2016 Miscellaneous    Medical Oncology consultation.  No adjuvant chemotherapy needed based upon risk factors and small benefit.  Informed decision made by patient to not pursue systemic chemotherapy in adjuvant setting.      11/05/2016 Procedure    Colonoscopy by Dr. Laural Golden.  soft anal tags found on digital rectal exam. - Patent end-to-side colo-colonic anastomosis, characterized by healthy appearing mucosa. - Three 5 to 7 mm polyps in the proximal transverse colon, removed with a cold snare. Resected and retrieved. - One 10 mm polyp in the proximal transverse colon, removed with a hot snare. Resected and retrieved. - One 20 mm polyp in the proximal sigmoid colon, removed with a hot snare. Resected and retrieved. Clip (MR conditional) was placed. - One 15 to 25 mm polyp at the recto-sigmoid colon, removed with a hot snare. Resected and retrieved. Clip (MR conditional) was placed. - Diverticulosis in the sigmoid colon and in the descending colon. - The distal rectum and anal verge are normal on retroflexion view.      11/07/2016 Pathology Results    1. Colon, polyp(s), transverse proximal - TUBULAR ADENOMA(S). - HIGH GRADE DYSPLASIA IS NOT IDENTIFIED. 2. Colon, polyp(s), proximal  sigmoid - TUBULOVILLOUS ADENOMA WITH FOCAL HIGH GRADE DYSPLASIA (5%). 3. Rectosigmoid , polyp - TUBULOVILLOUS ADENOMA WITH HIGH GRADE DYSPLASIA (15%).      12/26/2016 Imaging    CT abd/pelvis-1. Interval development of a 2.4 cm rim enhancing lesion in the lateral segment left liver, highly suspicious for  metastatic disease. 2. New abnormal irregular soft tissue in the cul-de-sac, involving the anterior wall of the rectum and posterior cervix. This is highly suspicious for metastatic involvement (drop lesion). 3. Interval Right hemicolectomy. 4.  Abdominal Aortic Atherosclerois (ICD10-170.0)      01/16/2017 Procedure    IR biopsy of hepatic lesion      01/19/2017 Pathology Results    Liver, needle/core biopsy, Left Lobe - ADENOCARCINOMA Microscopic Comment The morphology is consistent with metastatic colorectal adenocarcinoma. There is sufficient tissue for additional studies. Called to [facility] on 01/19/17      02/06/2017 Imaging    CT chest- 1. Stable CT chest. No substantial change in the 7 mm short axis left paraesophageal lymph node. 2. Rim enhancing lesion lateral segment left liver has progressed in the interval      02/09/2017 Procedure    Port-A-Cath insertion by Dr. Arnoldo Morale      02/10/2017 -  Chemotherapy    The patient had palonosetron (ALOXI) injection 0.25 mg, 0.25 mg, Intravenous,  Once, 1 of 4 cycles  leucovorin 800 mg in dextrose 5 % 250 mL infusion, 404 mg/m2 = 792 mg, Intravenous,  Once, 1 of 4 cycles  oxaliplatin (ELOXATIN) 170 mg in dextrose 5 % 500 mL chemo infusion, 85 mg/m2 = 170 mg, Intravenous,  Once, 1 of 4 cycles  fluorouracil (ADRUCIL) chemo injection 800 mg, 400 mg/m2 = 800 mg, Intravenous,  Once, 1 of 4 cycles  fluorouracil (ADRUCIL) 4,750 mg in sodium chloride 0.9 % 55 mL chemo infusion, 2,400 mg/m2 = 4,750 mg, Intravenous, 1 Day/Dose, 1 of 4 cycles  for chemotherapy treatment.        03/11/2017 Pathology Results    FoundationONE- KRAS wildtype, NRAS wildtype, MS stable, TMB 6 Muts/Mb, APC X833*, APC A250*, TERT promoter-124C>T, TP53 LOSS.  Therapies with clinical benefit include cetuximab, Panitumumab.      05/01/2017 Imaging    CT C/A/P: 1. The left hepatic metastasis is smaller in the interval. The soft tissue between the anterior wall of  the rectum and the posterior uterus is concerning for a drop metastasis, similar in the interval. No other metastatic disease is identified. 2. The ascending thoracic aorta measures 4.1 cm. Ascending thoracic aortic aneurysm. Recommend semi-annual imaging followup by CTA or MRA and referral to cardiothoracic surgery if not already obtained. This recommendation follows 2010 ACCF/AHA/AATS/ACR/ASA/SCA/SCAI/SIR/STS/SVM Guidelines for the Diagnosis and Management of Patients With Thoracic Aortic Disease. Circulation. 2010; 121: N397-Q734 3. Atherosclerosis in the aorta. 4. Coronary artery calcifications.      09/04/2017 Imaging    CT C/A/P: Mild decreased size of small left hepatic lobe metastasis since previous study. No other sites of metastatic disease identified.  New benign appearing L1, L3, and L5 vertebral body compression fractures since 05/01/2017 exam.  Colonic diverticulosis, without radiographic evidence of diverticulitis.        INTERVAL HISTORY:  Ms. Bultman 71 y.o. female returns for routine follow-up for metastatic colon cancer with her daughter.   She completed cycle 12 of FOLFOX and Avastin on 08/11/2017.  She states that she fell and broke her left arm on 09/01/2017.  Otherwise she has been doing well.  She denies any recent  infections.  She states that her appetite has improved. She denies any chest pain, shortness breath, abdominal pain, focal weakness. 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   REVIEW OF SYSTEMS:  Review of Systems - Oncology (per HPI) ROS as per HPI otherwise 12 point ROS is negative.  PAST MEDICAL/SURGICAL HISTORY:  Past Medical History:  Diagnosis Date  . Cancer of right colon ([place]) 07/30/2016  . Colon cancer ([place])   . Depression   . Hypercholesterolemia   . Hypertension   . Iron deficiency anemia due to chronic blood loss 07/30/2016  . Vitamin B12 deficiency 08/13/2016   Past Surgical  History:  Procedure Laterality Date  . COLONOSCOPY N/A 11/05/2016   Procedure: COLONOSCOPY;  Surgeon: Rogene Houston, MD;  Location: [facility];  Service: Endoscopy;  Laterality: N/A;  1:20  . PARTIAL COLECTOMY N/A 06/27/2016   Procedure: PARTIAL COLECTOMY;  Surgeon: Vickie Epley, MD;  Location: [facility] ORS;  Service: General;  Laterality: N/A;  . PORTACATH PLACEMENT Right 02/09/2017   Procedure: INSERTION PORT-A-CATH;  Surgeon: Aviva Signs, MD;  Location: [facility] ORS;  Service: General;  Laterality: Right;  right subclavian     SOCIAL HISTORY:  Social History   Socioeconomic History  . Marital status: Widowed    Spouse name: Not on file  . Number of children: Not on file  . Years of education: Not on file  . Highest education level: Not on file  Social Needs  . Financial resource strain: Not on file  . Food insecurity - worry: Not on file  . Food insecurity - inability: Not on file  . Transportation needs - medical: Not on file  . Transportation needs - non-medical: Not on file  Occupational History  . Not on file  Tobacco Use  . Smoking status: Former Smoker    Packs/day: 0.50    Years: 48.00    Pack years: 24.00    Types: Cigarettes    Last attempt to quit: 06/24/2016    Years since quitting: 1.2  . Smokeless tobacco: Never Used  . Tobacco comment: quit smoking Sept 2017  Substance and Sexual Activity  . Alcohol use: No    Comment: glass wine  twice per year  . Drug use: No  . Sexual activity: No    Birth control/protection: None    Comment: widowed- 1 daughter  Other Topics Concern  . Not on file  Social History Narrative  . Not on file    FAMILY HISTORY:  Family History  Problem Relation Age of Onset  . Hypertension Mother   . Other Father        some type of "stomach ailment"  . Suicidality Brother   . Stomach cancer Maternal Aunt   . Brain cancer Brother     CURRENT MEDICATIONS:  Outpatient Encounter Medications as of 10/07/2017  Medication Sig  .  acetaminophen (TYLENOL) 325 MG tablet Take 325 mg by mouth every 6 (six) hours as needed for mild pain or moderate pain.  Marland Kitchen amLODipine (NORVASC) 5 MG tablet Take 5 mg by mouth daily.  . Ascorbic Acid (VITAMIN C) 1000 MG tablet Take 1,000 mg by mouth daily.  . busPIRone (BUSPAR) 15 MG tablet Take 15 mg by mouth 2 (two) times daily.   . carvedilol (COREG) 3.125 MG tablet   . cyclobenzaprine (FLEXERIL) 10 MG tablet Take 10 mg by mouth 3 (three) times daily as needed for muscle spasms.  Marland Kitchen  dexamethasone (DECADRON) 4 MG tablet Take 2 tablets (8 mg total) by mouth daily. Start the day after chemotherapy for 2 days. Take with food.  . escitalopram (LEXAPRO) 20 MG tablet Take 20 mg by mouth daily.   . fluorouracil CALGB 06004 in sodium chloride 0.9 % 150 mL Inject into the vein over 48 hr.  . HYDROcodone-acetaminophen (NORCO) 5-325 MG tablet Take 1 tablet by mouth every 6 (six) hours as needed for moderate pain.  Marland Kitchen LEUCOVORIN CALCIUM IV Inject into the vein. Every 2 weeks  . lidocaine-prilocaine (EMLA) cream Apply to affected area once  . Loperamide HCl (IMODIUM PO) Take 1 tablet by mouth daily as needed (for diarrhea).   . meclizine (ANTIVERT) 32 MG tablet Take 1 tablet (32 mg total) by mouth 3 (three) times daily as needed.  . metFORMIN (GLUCOPHAGE) 500 MG tablet Take 500 mg by mouth daily with breakfast.   . Omega-3 Fatty Acids (FISH OIL) 1200 MG CAPS Take 1,200 capsules by mouth daily.   . ondansetron (ZOFRAN) 8 MG tablet Take 1 tablet (8 mg total) by mouth 2 (two) times daily as needed for refractory nausea / vomiting. Start on day 3 after chemotherapy.  . OXALIPLATIN IV Inject into the vein. Every 2 weeks  . prochlorperazine (COMPAZINE) 10 MG tablet Take 1 tablet (10 mg total) by mouth every 6 (six) hours as needed (Nausea or vomiting).  . simvastatin (ZOCOR) 40 MG tablet Take 40 mg by mouth daily.  Marland Kitchen topiramate (TOPAMAX) 25 MG tablet   . warfarin (COUMADIN) 1 MG tablet Take 1-5 mg by mouth See  admin instructions. Takes 1 mg daily except 2.5 mg on Sundays and Thursdays.  Marland Kitchen warfarin (COUMADIN) 5 MG tablet Take 1-5 mg by mouth See admin instructions. Takes 1 mg daily except 2.5 mg on Sundays and Thursdays.  . [DISCONTINUED] HYDROcodone-acetaminophen (NORCO) 5-325 MG tablet Take 1 tablet by mouth every 6 (six) hours as needed for moderate pain.   No facility-administered encounter medications on file as of 10/07/2017.     ALLERGIES:  No Known Allergies   PHYSICAL EXAM:  ECOG Performance status: 1  Vitals:   10/07/17 1157  BP: (!) 132/97  Pulse: (!) 118  Resp: 18  Temp: 98 F (36.7 C)  SpO2: 98%    Physical Exam  Constitutional: She is well-developed, well-nourished, and in no distress. No distress.  HENT:  Head: Normocephalic and atraumatic.  Eyes: Conjunctivae are normal. No scleral icterus.  Neck: Normal range of motion. Neck supple.  Cardiovascular: Normal rate, regular rhythm and normal heart sounds. Exam reveals no gallop and no friction rub.  No murmur heard. Pulmonary/Chest: Effort normal and breath sounds normal. No respiratory distress. She has no wheezes.  Abdominal: Soft. Bowel sounds are normal. There is no tenderness.  Musculoskeletal: Normal range of motion. She exhibits no edema or tenderness.  Lymphadenopathy:    She has no cervical adenopathy.  Neurological: She is alert.  Skin: Skin is warm and dry. No rash noted.  Psychiatric: Affect and judgment normal. She does not exhibit a depressed mood.  Nursing note and vitals reviewed.    LABORATORY DATA:  I have reviewed the labs as listed.  CBC    Component Value Date/Time   WBC 9.7 10/07/2017 1110   RBC 3.74 (L) 10/07/2017 1110   HGB 11.6 (L) 10/07/2017 1110   HCT 38.3 10/07/2017 1110   PLT 282 10/07/2017 1110   MCV 102.4 (H) 10/07/2017 1110   MCH 31.0 10/07/2017 1110  MCHC 30.3 10/07/2017 1110   RDW 14.6 10/07/2017 1110   LYMPHSABS 2.8 10/07/2017 1110   MONOABS 0.7 10/07/2017 1110    EOSABS 0.2 10/07/2017 1110   BASOSABS 0.0 10/07/2017 1110   CMP Latest Ref Rng & Units 10/07/2017 08/11/2017 07/28/2017  Glucose 65 - 99 mg/dL 109(H) 91 101(H)  BUN 6 - 20 mg/dL 11 11 9   Creatinine 0.44 - 1.00 mg/dL 0.66 0.63 0.59  Sodium 135 - 145 mmol/L 136 140 138  Potassium 3.5 - 5.1 mmol/L 5.0 3.5 2.9(L)  Chloride 101 - 111 mmol/L 99(L) 107 101  CO2 22 - 32 mmol/L 27 23 29   Calcium 8.9 - 10.3 mg/dL 9.6 8.4(L) 8.9  Total Protein 6.5 - 8.1 g/dL 8.0 5.9(L) 6.5  Total Bilirubin 0.3 - 1.2 mg/dL 0.8 0.6 0.6  Alkaline Phos 38 - 126 U/L 76 65 62  AST 15 - 41 U/L 26 30 19   ALT 14 - 54 U/L 19 16 13(L)    PENDING LABS:    DIAGNOSTIC IMAGING:   PATHOLOGY:  Liver biopsy: 01/16/17     Foundation One testing: 01/16/17          ASSESSMENT & PLAN:   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 CEA elevated at 39.3 at time of diagnosis. Underwent partial colectomy. She elected did not receive adjuvant chemo given possible risks and likely small benefit. Repeat CT imaging in 12/2016 revealed left liver lesion and soft tissue lesion in the cul-de-sac, both concerning for metastatic disease. Liver lesion biopsied by IR and positive for metastatic disease.  Started FOLFOX chemotherapy on 02/10/17; Avastin added with cycle #3.  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  She completed cycle 12 of FOLFOX and Avastin on 08/11/2017.  -I have reviewed patient's CT chest abdomen pelvis in detail with her and her daughter today.  She still has a 1.2 cm hepatic lesion but otherwise has no evidence of disease anywhere else.  We will refer her to Dr. Barry Dienes to see if she is a surgical candidate for resection of illegal metastatic disease in her liver. -Plan to repeat restaging CT chest abdomen pelvis and 3 months as well as labs were CEA, CBC, CMP.   -Return to clinic in 3 months for follow-up.   -Refilled her  hydrocodone today.  Orders Placed This Encounter  Procedures  . CT Abdomen Pelvis W Contrast    Standing Status:   Future    Standing Expiration Date:   10/07/2018    Order Specific Question:   If indicated for the ordered procedure, I authorize the administration of contrast media per Radiology protocol    Answer:   Yes    Order Specific Question:   Preferred imaging location?    Answer:   [facility]    Order Specific Question:   Radiology Contrast Protocol - do NOT remove file path    Answer:   [email or web address]  . CT Chest W Contrast    Standing Status:   Future    Standing Expiration Date:   10/07/2018    Order Specific Question:   If indicated for the ordered procedure, I authorize the administration of contrast media per Radiology protocol    Answer:   Yes    Order Specific Question:   Preferred imaging location?    Answer:   [facility]    Order Specific Question:   Radiology Contrast Protocol - do NOT remove  file path    Answer:   [email or web address]  . CBC with Differential    Standing Status:   Future    Standing Expiration Date:   10/07/2018  . Comprehensive metabolic panel    Standing Status:   Future    Standing Expiration Date:   10/07/2018  . CEA    Standing Status:   Future    Standing Expiration Date:   10/07/2018      All questions were answered to patient's stated satisfaction. Encouraged patient to call with any new concerns or questions before her next visit to the cancer center and we can certain see her sooner, if needed.    Twana First, MD

## 2017-10-08 ENCOUNTER — Encounter (HOSPITAL_COMMUNITY): Payer: Self-pay | Admitting: Oncology

## 2017-10-08 LAB — CEA: CEA1: 6.5 ng/mL — AB (ref 0.0–4.7)

## 2017-10-08 NOTE — Progress Notes (Signed)
Referred patient to [facility], Dr. Barry Dienes, on 10/07/17.  Received response on 10/08/17 that patient has been scheduled to see Dr. Barry Dienes 10/19/17 @ 11:30 and patient was contacted by [facility].

## 2017-10-19 ENCOUNTER — Other Ambulatory Visit: Payer: Self-pay | Admitting: General Surgery

## 2017-10-21 ENCOUNTER — Other Ambulatory Visit (HOSPITAL_COMMUNITY): Payer: Self-pay | Admitting: General Surgery

## 2017-10-21 DIAGNOSIS — C787 Secondary malignant neoplasm of liver and intrahepatic bile duct: Principal | ICD-10-CM

## 2017-10-21 DIAGNOSIS — C189 Malignant neoplasm of colon, unspecified: Secondary | ICD-10-CM

## 2017-10-30 ENCOUNTER — Encounter (INDEPENDENT_AMBULATORY_CARE_PROVIDER_SITE_OTHER): Payer: Self-pay | Admitting: *Deleted

## 2017-10-30 ENCOUNTER — Ambulatory Visit (HOSPITAL_COMMUNITY): Payer: Medicare Other

## 2017-10-31 ENCOUNTER — Ambulatory Visit (HOSPITAL_COMMUNITY)
Admission: RE | Admit: 2017-10-31 | Discharge: 2017-10-31 | Disposition: A | Discharge status: Home / Self Care | Payer: Medicare Other | Source: Ambulatory Visit | Attending: General Surgery | Admitting: General Surgery

## 2017-10-31 DIAGNOSIS — K7689 Other specified diseases of liver: Secondary | ICD-10-CM | POA: Diagnosis not present

## 2017-10-31 DIAGNOSIS — C189 Malignant neoplasm of colon, unspecified: Secondary | ICD-10-CM

## 2017-10-31 DIAGNOSIS — C787 Secondary malignant neoplasm of liver and intrahepatic bile duct: Secondary | ICD-10-CM

## 2017-10-31 MED ORDER — GADOBENATE DIMEGLUMINE 529 MG/ML IV SOLN
15.0000 mL | Freq: Once | INTRAVENOUS | Status: AC
  Administered 2017-10-31: 15 mL via INTRAVENOUS

## 2017-11-27 NOTE — Pre-Procedure Instructions (Signed)
Janet Hanson  11/27/2017      [place] [address] [place] [address] Phone: [phone] Fax: [phone]    Your procedure is scheduled on Thurs. Jan. 31  Report to [facility] Admitting at 5:30  A.M.  Call this number if you have problems the morning of surgery:  [phone]   Remember:  Do not eat food or drink liquids after midnight on Wed. Jan.30   Take these medicines the morning of surgery with A SIP OF WATER : tylenol if needed,buspirone (buspar), carvedilol (coreg), escitalopram (lexapro),hydrocodone if needed,              7 days prior to surgery STOP taking any Aspirin(unless otherwise instructed by your surgeon), Aleve, Naproxen, Ibuprofen, Motrin, Advil, Goody's, BC's, all herbal medications, fish oil, and all vitamins               STOP WARFARIN (COUMADIN) PER DR. BYERLY    Please complete your PRE-SURGERY ENSURE that was given to before you leave your house the morning of surgery.  Please, if able, drink it in one setting. DO NOT SIP.     How to Manage Your Diabetes Before and After Surgery  Why is it important to control my blood sugar before and after surgery? . Improving blood sugar levels before and after surgery helps healing and can limit problems. . A way of improving blood sugar control is eating a healthy diet by: o  Eating less sugar and carbohydrates o  Increasing activity/exercise o  Talking with your doctor about reaching your blood sugar goals . High blood sugars (greater than 180 mg/dL) can raise your risk of infections and slow your recovery, so you will need to focus on controlling your diabetes during the weeks before surgery. . Make sure that the doctor who takes care of your diabetes knows about your planned surgery including the date and location.  How do I manage my blood sugar before surgery? . Check your blood sugar at least 4 times a day, starting 2 days  before surgery, to make sure that the level is not too high or low. o Check your blood sugar the morning of your surgery when you wake up and every 2 hours until you get to the Short Stay unit. . If your blood sugar is less than 70 mg/dL, you will need to treat for low blood sugar: o Do not take insulin. o Treat a low blood sugar (less than 70 mg/dL) with  cup of clear juice (cranberry or apple), 4 glucose tablets, OR glucose gel. Recheck blood sugar in 15 minutes after treatment (to make sure it is greater than 70 mg/dL). If your blood sugar is not greater than 70 mg/dL on recheck, call [phone] o  for further instructions. . Report your blood sugar to the short stay nurse when you get to Short Stay.  . If you are admitted to the hospital after surgery: o Your blood sugar will be checked by the staff and you will probably be given insulin after surgery (instead of oral diabetes medicines) to make sure you have good blood sugar levels. o The goal for blood sugar control after surgery is 80-180 mg/dL.      WHAT DO I DO ABOUT MY DIABETES MEDICATION?   Marland Kitchen Do not take oral diabetes medicines (pills) the morning of surgery.         Do not  wear jewelry, make-up or nail polish.  Do not wear lotions, powders, or perfumes, or deodorant.  Do not shave 48 hours prior to surgery.  Men may shave face and neck.  Do not bring valuables to the hospital.  [facility] is not responsible for any belongings or valuables.  Contacts, dentures or bridgework may not be worn into surgery.  Leave your suitcase in the car.  After surgery it may be brought to your room.  For patients admitted to the hospital, discharge time will be determined by your treatment team.  Patients discharged the day of surgery will not be allowed to drive home.    Special instructions:  [facility]- Preparing For Surgery  Before surgery, you can play an important role. Because skin is not sterile, your skin needs to be as  free of germs as possible. You can reduce the number of germs on your skin by washing with CHG (chlorahexidine gluconate) Soap before surgery.  CHG is an antiseptic cleaner which kills germs and bonds with the skin to continue killing germs even after washing.  Please do not use if you have an allergy to CHG or antibacterial soaps. If your skin becomes reddened/irritated stop using the CHG.  Do not shave (including legs and underarms) for at least 48 hours prior to first CHG shower. It is OK to shave your face.  Please follow these instructions carefully.   1. Shower the NIGHT BEFORE SURGERY and the MORNING OF SURGERY with CHG.   2. If you chose to wash your hair, wash your hair first as usual with your normal shampoo.  3. After you shampoo, rinse your hair and body thoroughly to remove the shampoo.  4. Use CHG as you would any other liquid soap. You can apply CHG directly to the skin and wash gently with a scrungie or a clean washcloth.   5. Apply the CHG Soap to your body ONLY FROM THE NECK DOWN.  Do not use on open wounds or open sores. Avoid contact with your eyes, ears, mouth and genitals (private parts). Wash Face and genitals (private parts)  with your normal soap.  6. Wash thoroughly, paying special attention to the area where your surgery will be performed.  7. Thoroughly rinse your body with warm water from the neck down.  8. DO NOT shower/wash with your normal soap after using and rinsing off the CHG Soap.  9. Pat yourself dry with a CLEAN TOWEL.  10. Wear CLEAN PAJAMAS to bed the night before surgery, wear comfortable clothes the morning of surgery  11. Place CLEAN SHEETS on your bed the night of your first shower and DO NOT SLEEP WITH PETS.    Day of Surgery: Do not apply any deodorants/lotions. Please wear clean clothes to the hospital/surgery center.      Please read over the following fact sheets that you were given. Coughing and Deep Breathing and Surgical Site  Infection Prevention

## 2017-11-30 ENCOUNTER — Encounter (HOSPITAL_COMMUNITY)
Admission: RE | Admit: 2017-11-30 | Discharge: 2017-11-30 | Disposition: A | Discharge status: Home / Self Care | Payer: Medicare Other | Source: Ambulatory Visit | Attending: General Surgery | Admitting: General Surgery

## 2017-11-30 ENCOUNTER — Other Ambulatory Visit: Payer: Self-pay

## 2017-11-30 ENCOUNTER — Encounter (HOSPITAL_COMMUNITY): Payer: Self-pay

## 2017-11-30 HISTORY — DX: Polyneuropathy, unspecified: G62.9

## 2017-11-30 HISTORY — DX: Unspecified osteoarthritis, unspecified site: M19.90

## 2017-11-30 HISTORY — DX: Family history of other specified eye disorder: Z83.518

## 2017-11-30 HISTORY — DX: Chronic obstructive pulmonary disease, unspecified: J44.9

## 2017-11-30 HISTORY — DX: Anxiety disorder, unspecified: F41.9

## 2017-11-30 HISTORY — DX: Other pulmonary embolism without acute cor pulmonale: I26.99

## 2017-11-30 HISTORY — DX: Type 2 diabetes mellitus without complications: E11.9

## 2017-11-30 LAB — URINALYSIS, COMPLETE (UACMP) WITH MICROSCOPIC
Bilirubin Urine: NEGATIVE
Glucose, UA: NEGATIVE mg/dL
Hgb urine dipstick: NEGATIVE
Ketones, ur: NEGATIVE mg/dL
Nitrite: NEGATIVE
PH: 6 (ref 5.0–8.0)
Protein, ur: NEGATIVE mg/dL
SPECIFIC GRAVITY, URINE: 1.023 (ref 1.005–1.030)

## 2017-11-30 LAB — CBC WITH DIFFERENTIAL/PLATELET
BASOS ABS: 0 10*3/uL (ref 0.0–0.1)
Basophils Relative: 0 %
Eosinophils Absolute: 0.1 10*3/uL (ref 0.0–0.7)
Eosinophils Relative: 1 %
HEMATOCRIT: 39.1 % (ref 36.0–46.0)
HEMOGLOBIN: 12 g/dL (ref 12.0–15.0)
LYMPHS ABS: 3.2 10*3/uL (ref 0.7–4.0)
LYMPHS PCT: 31 %
MCH: 30.2 pg (ref 26.0–34.0)
MCHC: 30.7 g/dL (ref 30.0–36.0)
MCV: 98.2 fL (ref 78.0–100.0)
Monocytes Absolute: 0.6 10*3/uL (ref 0.1–1.0)
Monocytes Relative: 5 %
NEUTROS ABS: 6.6 10*3/uL (ref 1.7–7.7)
Neutrophils Relative %: 63 %
PLATELETS: 265 10*3/uL (ref 150–400)
RBC: 3.98 MIL/uL (ref 3.87–5.11)
RDW: 13.9 % (ref 11.5–15.5)
WBC: 10.4 10*3/uL (ref 4.0–10.5)

## 2017-11-30 LAB — COMPREHENSIVE METABOLIC PANEL
ALK PHOS: 64 U/L (ref 38–126)
ALT: 17 U/L (ref 14–54)
AST: 29 U/L (ref 15–41)
Albumin: 4.1 g/dL (ref 3.5–5.0)
Anion gap: 13 (ref 5–15)
BILIRUBIN TOTAL: 0.7 mg/dL (ref 0.3–1.2)
BUN: 11 mg/dL (ref 6–20)
CALCIUM: 9.3 mg/dL (ref 8.9–10.3)
CHLORIDE: 100 mmol/L — AB (ref 101–111)
CO2: 24 mmol/L (ref 22–32)
CREATININE: 0.77 mg/dL (ref 0.44–1.00)
GFR calc Af Amer: >60 mL/min (ref >60)
GFR calc non Af Amer: >60 mL/min (ref >60)
Glucose, Bld: 100 mg/dL — ABNORMAL HIGH (ref 65–99)
Potassium: 4.2 mmol/L (ref 3.5–5.1)
Sodium: 137 mmol/L (ref 135–145)
Total Protein: 7.2 g/dL (ref 6.5–8.1)

## 2017-11-30 LAB — HEMOGLOBIN A1C
Hgb A1c MFr Bld: 5.8 % — ABNORMAL HIGH (ref 4.8–5.6)
Mean Plasma Glucose: 119.76 mg/dL

## 2017-11-30 LAB — PROTIME-INR
INR: 1.65
Prothrombin Time: 19.3 seconds — ABNORMAL HIGH (ref 11.4–15.2)

## 2017-11-30 LAB — ABO/RH: ABO/RH(D): A POS

## 2017-11-30 LAB — PREPARE RBC (CROSSMATCH)

## 2017-11-30 NOTE — Progress Notes (Signed)
PCP is Dellia Nims, NP @ [facility] in [place], [place] 11/2017 Oncologist Dr. Talbert Cage  Last dose of chemo Nov. 2018 H/l PE 06/2018 Denies any cardiac issues, no cp, sob. Has not seen a cardiolgist Was tachycardic when she came into PAT appt.-- 127.  Since has come down to 107.  Pt states that whenever she sees hospitals  It goes up. She has stopped her coumadin, per her NP on 1/26. Doesn't check her sugars daily.  She was told by PCP, that if she lost weight, it would help, which she did.   Not sure of last A!C (did one today) Am double checking with [facility].  Patient states that MD said she didn't have to do Fleets. I left message on her landline to go ahead and use the Fleets enema.

## 2017-11-30 NOTE — Progress Notes (Signed)
PCP is Dellia Nims, NP @ [facility] in [place], [place] 11/2017 Oncologist Dr. Talbert Cage  Last dose of chemo Nov. 2018 H/l PE 06/2018 Denies any cardiac issues, no cp, sob. Has not seen a cardiolgist Was tachycardic when she came into PAT appt.-- 127.  Since has come down to 107.  Pt states that whenever she sees hospitals  It goes up. She has stopped her coumadin, per her NP on 1/26. Doesn't check her sugars daily.  She was told by PCP, that if she lost weight, it would help, which she did.   Not sure of last A!C (did one today) Am double checking with [facility].  Patient states that MD said she didn't have to do Fleets.

## 2017-11-30 NOTE — Progress Notes (Addendum)
Anesthesia Chart Review: Patient is a 72 year female scheduled for hand assisted laparoscopic partial hepatectomy, possible laparoscopic ventral hernia on 12/03/17 by Dr. Stark Klein (first case). She has known colon cancer with liver mets. Oncology referred for liver resection and with plan repeat restaging CT in 3 months.   History includes former smoker, HTN, hypercholesterolemia, right/ascending colon cancer 06/2016 with liver mets 01/2017 (s/p partial colectomy/appendectomy 06/27/16; right [place] Port-a-cath 02/09/17; completed 12 cycles FOLFOX and Avastin 08/11/17), RUL/RLL PE (without evidence of right heart strain; incidental finding on staging CT, confirmed with CTA) 06/30/16, iron deficiency anemia, vitamin B12 deficiency, depression.   - PCP is Dellia Nims, NP [facility] in [place]. Reported office visit earlier this month. - HEM-ONC is Dr. Twana First. Last visit 10/07/17. She referred patient to Dr. Barry Dienes. Orders have already been placed for 3 month follow-up CT chest/abd/pelvis.   - GI is Dr. Hildred Laser. - She denied seeing a cardiologist.  Meds include amlodipine, Buspar, Coreg 3.125 mg daily, Lexapro, metformin, fish oil, Zocor, warfarin (holding for 5 days pre-op), Ambien.   BP 132/84   Pulse (!) 118   Temp 37.2 C (Oral)   Resp 18   Ht 5\' 2"  (1.575 m)   Wt 163 lb 2 oz (74 kg)   SpO2 96%   BMI 29.84 kg/m   While at PAT, HR 107 (at end of PAT) to 127 (on arrival--"booked it" to PAT). EKG repeated at PAT done to tachycardia and showed ST which was noted on previous tracings.   EKG 11/30/17 (done due to persistent tachycardia, asymptomatic): ST at 111 bpm. Low voltage QRS. Possible inferior infarct (age undetermined). R wave progression improved, inferior leads similar, and HR slower when compared to 06/30/17 tracing. Review of multiple EKGs in [facility] since 06/2016-06/2017 show HR 98-121 bpm. She is on b-blocker therapy (but only once a daily and at a low dose).  HR was documented at 93 bpm from Dr. Marlowe Aschoff 10/19/17 visit and 118 bpm at 10/07/17 oncology visit. Patient reported felling in her usual state of health--no chest pain or SOB. Reports it's fairly typical for her HR to be elevated at "hospitals."  MRI Abdomen 10/31/17: IMPRESSION: 1. Single 2.6 cm heterogeneously enhancing lesion is identified in the lateral segment of the left liver and compatible with metastatic disease. No additional metastatic lesions evident within the liver parenchyma. 2. No abdominal lymphadenopathy.  CT chest/abd/pelvis 09/04/17: IMPRESSION: - Mild decreased size of small left hepatic lobe metastasis since previous study. No other sites of metastatic disease identified. - New benign appearing L1, L3, and L5 vertebral body compression fractures since 05/01/2017 exam. - Colonic diverticulosis, without radiographic evidence of diverticulitis. (05/01/17 chest CT w/ contrast showed 4.1 cm ascending TAA; however, 09/04/17 chest CT did not make not of a TAA.)  Preoperative labs noted. Cr 0.77. Glucose 100. AST/ALT WNL. CBC WNL. PT 19.3, INR 1.65. A1c 5.8. T&S/T&C done. UA/microscopic with large leukocytes, negative nitrites, WBC 6-30, rare bacteria, squamous epithelial too numerous to count. UA results called to [facility] triage nurse Abigail Butts. Defer any treatment recommendations to surgery. Plan repeat PT/INR on the day of surgery (once off warfarin for 5 days).    Patient with resting tachycardia, present on EKGs dating back to 06/2016. She denied associated symptoms.  She is only on a low, once daily dose of Coreg. She is not anemic. UA is abnormal but no leukocytosis (although WBC high abnormal). PE is > 1 year ago. EKG appears stable. Since  tachycardia appears more chronic, no arrhythmia (ie, afib/flutter) on EKG, and no reported symptoms then I would anticipate that she can proceed as planned. Could consider additional b-blocker if presents tachycardic on the day of surgery. Discussed  with anesthesiologist Dr. Hoy Morn.  George Hugh [facility]/Anesthesiology Phone [phone] 11/30/2017 4:24 PM

## 2017-11-30 NOTE — Pre-Procedure Instructions (Addendum)
HARRIS KISTLER  11/30/2017      [facility] [address] [place] [address] Phone: [phone] Fax: [phone]    Your procedure is scheduled on Thurs. Jan. 31    Report to [facility] Admitting at 5:30  A.M.             (posted surgery time 7:30am - 11:00am)             Call this number if you have problems the morning of surgery:  [phone]   Remember:   Do not eat food or drink liquids after midnight on Wed. Jan.30   Take these medicines the morning of surgery with A SIP OF WATER : tylenol if needed,buspirone (buspar), carvedilol (coreg), escitalopram (lexapro),hydrocodone if needed,              7 days prior to surgery STOP taking any Aspirin(unless otherwise instructed by your surgeon), Aleve, Naproxen, Ibuprofen, Motrin, Advil, Goody's, BC's, all herbal medications, fish oil, and all vitamins               STOP WARFARIN (COUMADIN) PER DR. BYERLY    Please complete your PRE-SURGERY ENSURE that was given to before you leave your house the morning of surgery.  Please, if able, drink it in one setting. DO NOT SIP.              Do not wear jewelry, make-up or nail polish.  Do not wear lotions, powders,  perfumes, or deodorant.  Do not shave 48 hours prior to surgery.    Do not bring valuables to the hospital.  [facility] is not responsible for any belongings or valuables.  Contacts, dentures or bridgework may not be worn into surgery.  Leave your suitcase in the car.  After surgery it may be brought to your room.  For patients admitted to the hospital, discharge time will be determined by your treatment team.  Patients discharged the day of surgery will not be allowed to drive home.    Special instructions:  [facility]- Preparing For Surgery  Before surgery, you can play an important role. Because skin is not sterile, your skin needs to be as free of germs as possible. You can reduce the  number of germs on your skin by washing with CHG (chlorahexidine gluconate) Soap before surgery.  CHG is an antiseptic cleaner which kills germs and bonds with the skin to continue killing germs even after washing.  Please do not use if you have an allergy to CHG or antibacterial soaps. If your skin becomes reddened/irritated stop using the CHG.  Do not shave (including legs and underarms) for at least 48 hours prior to first CHG shower. It is OK to shave your face.  Please follow these instructions carefully.   1. Shower the NIGHT BEFORE SURGERY and the MORNING OF SURGERY with CHG.   2. If you chose to wash your hair, wash your hair first as usual with your normal shampoo.  3. After you shampoo, rinse your hair and body thoroughly to remove the shampoo.  4. Use CHG as you would any other liquid soap. You can apply CHG directly to the skin and wash gently with a scrungie or a clean washcloth.   5. Apply the CHG Soap to your body ONLY FROM THE NECK DOWN.  Do not use on open wounds or open sores. Avoid contact with your eyes, ears,  mouth and genitals (private parts). Wash Face and genitals (private parts)  with your normal soap.  6. Wash thoroughly, paying special attention to the area where your surgery will be performed.  7. Thoroughly rinse your body with warm water from the neck down.  8. DO NOT shower/wash with your normal soap after using and rinsing off the CHG Soap.  9. Pat yourself dry with a CLEAN TOWEL.  10. Wear CLEAN PAJAMAS to bed the night before surgery, wear comfortable clothes the morning of surgery  11. Place CLEAN SHEETS on your bed the night of your first shower and DO NOT SLEEP WITH PETS.  Day of Surgery: Do not apply any deodorants/lotions. Please wear clean clothes to the hospital/surgery center.    Please read over the following fact sheets that you were given. Coughing and Deep Breathing and Surgical Site Infection Prevention      How to Manage Your  Diabetes Before and After Surgery  Why is it important to control my blood sugar before and after surgery? . Improving blood sugar levels before and after surgery helps healing and can limit problems. . A way of improving blood sugar control is eating a healthy diet by: o  Eating less sugar and carbohydrates o  Increasing activity/exercise o  Talking with your doctor about reaching your blood sugar goals . High blood sugars (greater than 180 mg/dL) can raise your risk of infections and slow your recovery, so you will need to focus on controlling your diabetes during the weeks before surgery. . Make sure that the doctor who takes care of your diabetes knows about your planned surgery including the date and location.  How do I manage my blood sugar before surgery? . Check your blood sugar at least 4 times a day, starting 2 days before surgery, to make sure that the level is not too high or low. o Check your blood sugar the morning of your surgery when you wake up and every 2 hours until you get to the Short Stay unit.  . If your blood sugar is less than 70 mg/dL, you will need to treat for low blood sugar: o Do not take insulin. o Treat a low blood sugar (less than 70 mg/dL) with  cup of clear juice (cranberry or apple), 4 glucose tablets, OR glucose gel  Recheck blood sugar in 15 minutes after treatment (to make sure it is greater than 70 mg/dL). If your blood sugar is not greater than 70 mg/dL on recheck, call [phone] o  for further instructions. . Report your blood sugar to the short stay nurse when you get to Short Stay.  . If you are admitted to the hospital after surgery: o Your blood sugar will be checked by the staff and you will probably be given insulin after surgery (instead of oral diabetes medicines) to make sure you have good blood sugar levels. o The goal for blood sugar control after surgery is 80-180 mg/dL.  WHAT DO I DO ABOUT MY DIABETES MEDICATION?   Marland Kitchen Do not  take oral diabetes medicines (pills) the morning of surgery.  . THE NIGHT BEFORE SURGERY, take ___________ units of ___________insulin.       . THE MORNING OF SURGERY, take _____________ units of __________insulin.  . The day of surgery, do not take other diabetes injectables, including Byetta (exenatide), Bydureon (exenatide ER), Victoza (liraglutide), or Trulicity (dulaglutide).  . If your CBG is greater than 220 mg/dL, you may take  of your sliding scale (  correction) dose of insulin.  Other Instructions:          Patient Signature:  Date:   Nurse Signature:  Date:   Reviewed and Endorsed by [facility] Patient Education Committee, August 2015

## 2017-12-01 NOTE — H&P (Signed)
Janet Hanson Location: [facility] Surgery Patient #: [id number] DOB: June 04, 1946 Widowed / Language: Cleophus Molt / Race: White Female   History of Present Illness  The patient is a 72 year old female who presents with a liver mass. Patient is a 72 year old female referred by Dr. Talbert Cage with a left-sided liver metastasis secondary to colon cancer. The patient was diagnosed with colon cancer and the summer of 2017. She underwent a right hemicolectomy in [place]. Her pathology showed no evidence of lymph node metastases, so she did not receive adjuvant therapy. She did have a colonoscopy as her colon lesion obstructing complete review. She had several polyps that were removed, none of which were malignant. This was done in January 2018. In February she had a lesion that was 2.4 cm in the left lateral segment of the liver. This was biopsied and was positive for adenocarcinoma consistent with colon primary. There was also some irregular tissue in the cul-de-sac between the rectum and the uterus. A Port-A-Cath was placed in April and she has been receiving FOLFOX with some Avastin. Follow-up imaging has shown that the mass has decreased to 1.4 cm. There was no evidence of change in the pelvic tissue.  She gets around well and does all of her shopping and housework. She broke her arm in October but this is healed. She used to smoke but quit when she found out she had cancer.  Her CEA was higher but has come down to 6.5 after treatment. Preoperatively it was 39 and dropped to 4 following surgery. It was up as high as 9.  CT chest/abd/pelvis 09/04/17  IMPRESSION: Mild decreased size of small left hepatic lobe metastasis since previous study. No other sites of metastatic disease identified.  New benign appearing L1, L3, and L5 vertebral body compression fractures since 05/01/2017 exam.  Colonic diverticulosis, without radiographic evidence of diverticulitis.  Colonoscopy  11/05/16 soft anal tags found on digital rectal exam. - Patent end-to-side colo-colonic anastomosis, characterized by healthy appearing mucosa. - Three 5 to 7 mm polyps in the proximal transverse colon, removed with a cold snare. Resected and retrieved. - One 10 mm polyp in the proximal transverse colon, removed with a hot snare. Resected and retrieved. - One 20 mm polyp in the proximal sigmoid colon, removed with a hot snare. Resected and retrieved. Clip (MR conditional) was placed. - One 15 to 25 mm polyp at the recto-sigmoid colon, removed with a hot snare. Resected and retrieved. Clip (MR conditional) was placed. - Diverticulosis in the sigmoid colon and in the descending colon. - The distal rectum and anal verge are normal on retroflexion view.  Pathology 01/16/17 Liver, needle/core biopsy, Left Lobe - ADENOCARCINOMA - SEE MICROSCOPIC DESCRIPTION Microscopic Comment The morphology is consistent with metastatic colorectal adenocarcinoma.  Colonoscopy pathology 11/05/16 Diagnosis 1. Colon, polyp(s), transverse proximal - TUBULAR ADENOMA(S). - HIGH GRADE DYSPLASIA IS NOT IDENTIFIED. 2. Colon, polyp(s), proximal sigmoid - TUBULOVILLOUS ADENOMA WITH FOCAL HIGH GRADE DYSPLASIA (5%). 3. Rectosigmoid , polyp - TUBULOVILLOUS ADENOMA WITH HIGH GRADE DYSPLASIA (15%).  Colectomy pathology 06/27/16 Diagnosis Colon, segmental resection for tumor, right ascending COLONIC ADENOCARCINOMA (8.5 CM), GRADE 2 THE TUMOR INVADES THROUGH THE MUSCULARIS PROPRIA INTO PERICOLONIC TISSUE (PT3) ALL MARGINS OF RESECTION ARE NEGATIVE FOR CARCINOMA FOURTEEN BENIGN LYMPH NODES (0/14) TUBULAR ADENOMA (X3) UNREMARKABLE APPENDIX  CMET, CBC essentially normal 10/07/17   Past Surgical History  Colon Polyp Removal - Colonoscopy  Colon Removal - Partial  Resection of Small Bowel   Diagnostic Studies History Colonoscopy  within last year Mammogram  never Pap Smear  never  Allergies No Known Drug  Allergies [10/19/2017]: Allergies Reconciled   Medication History AmLODIPine Besylate (5MG  Tablet, Oral) Active. BusPIRone HCl (15MG  Tablet, Oral) Active. Prochlorperazine Maleate (10MG  Tablet, Oral) Active. Escitalopram Oxalate (20MG  Tablet, Oral) Active. Simvastatin (40MG  Tablet, Oral) Active. Topiramate (25MG  Tablet, Oral) Active. Warfarin Sodium (5MG  Tablet, Oral) Active. Topamax (25MG  Tablet, Oral) Active. Lexapro (20MG  Tablet, Oral) Active. Antivert (12.5MG  Tablet, Oral) Active. MetFORMIN HCl (500MG  Tablet, Oral) Active. Zocor (40MG  Tablet, Oral) Active. Compazine (10MG  Tablet, Oral) Active. Zofran (8MG  Tablet, Oral) Active. Lidocaine-Prilocaine (2.5-2.5% Cream, External) Active. Imodium A-D (2MG  Capsule, Oral) Active. Medications Reconciled  Social History Alcohol use  Occasional alcohol use. Caffeine use  Carbonated beverages, Coffee, Tea. No drug use  Tobacco use  Former smoker.  Family History Arthritis  Father, Mother. Cancer  Brother. Heart Disease  Mother. Hypertension  Mother.  Pregnancy / Birth History Age at menarche  47 years. Age of menopause  86-55 Gravida  1 Irregular periods  Maternal age  58-30 Para  1  Other Problems Cancer  Colon Cancer  Pulmonary Embolism / Blood Clot in Legs     Review of Systems General Not Present- Appetite Loss, Chills, Fatigue, Fever, Night Sweats, Weight Gain and Weight Loss. Skin Not Present- Change in Wart/Mole, Dryness, Hives, Jaundice, New Lesions, Non-Healing Wounds, Rash and Ulcer. HEENT Present- Wears glasses/contact lenses. Not Present- Earache, Hearing Loss, Hoarseness, Nose Bleed, Oral Ulcers, Ringing in the Ears, Seasonal Allergies, Sinus Pain, Sore Throat, Visual Disturbances and Yellow Eyes. Respiratory Not Present- Bloody sputum, Chronic Cough, Difficulty Breathing, Snoring and Wheezing. Breast Not Present- Breast Mass, Breast Pain, Nipple Discharge and Skin  Changes. Cardiovascular Not Present- Chest Pain, Difficulty Breathing Lying Down, Leg Cramps, Palpitations, Rapid Heart Rate, Shortness of Breath and Swelling of Extremities. Gastrointestinal Not Present- Abdominal Pain, Bloating, Bloody Stool, Change in Bowel Habits, Chronic diarrhea, Constipation, Difficulty Swallowing, Excessive gas, Gets full quickly at meals, Hemorrhoids, Indigestion, Nausea, Rectal Pain and Vomiting. Female Genitourinary Not Present- Frequency, Nocturia, Painful Urination, Pelvic Pain and Urgency. Musculoskeletal Not Present- Back Pain, Joint Pain, Joint Stiffness, Muscle Pain, Muscle Weakness and Swelling of Extremities. Neurological Not Present- Decreased Memory, Fainting, Headaches, Numbness, Seizures, Tingling, Tremor, Trouble walking and Weakness. Psychiatric Not Present- Anxiety, Bipolar, Change in Sleep Pattern, Depression, Fearful and Frequent crying. Endocrine Not Present- Cold Intolerance, Excessive Hunger, Hair Changes, Heat Intolerance, Hot flashes and New Diabetes. Hematology Present- Blood Thinners. Not Present- Easy Bruising, Excessive bleeding, Gland problems, HIV and Persistent Infections.  Vitals Weight: 154 lb Height: 63in Body Surface Area: 1.73 m Body Mass Index: 27.28 kg/m  Temp.: 98.68F  Pulse: 93 (Regular)  BP: 155/82 (Sitting, Left Arm, Standard)       Physical Exam  General Mental Status-Alert. General Appearance-Consistent with stated age. Hydration-Well hydrated. Voice-Normal.  Head and Neck Head-normocephalic, atraumatic with no lesions or palpable masses. Trachea-midline. Thyroid Gland Characteristics - normal size and consistency.  Eye Eyeball - Bilateral-Extraocular movements intact. Sclera/Conjunctiva - Bilateral-No scleral icterus.  Chest and Lung Exam Chest and lung exam reveals -quiet, even and easy respiratory effort with no use of accessory muscles and on auscultation, normal breath  sounds, no adventitious sounds and normal vocal resonance. Inspection Chest Wall - Normal. Back - normal.  Cardiovascular Cardiovascular examination reveals -normal heart sounds, regular rate and rhythm with no murmurs and normal pedal pulses bilaterally.  Abdomen Inspection Inspection of the abdomen reveals - Note: reducible incisional hernia. Palpation/Percussion Palpation and Percussion  of the abdomen reveal - Soft, Non Tender, No Rebound tenderness, No Rigidity (guarding) and No hepatosplenomegaly. Auscultation Auscultation of the abdomen reveals - Bowel sounds normal.  Neurologic Neurologic evaluation reveals -alert and oriented x 3 with no impairment of recent or remote memory. Mental Status-Normal.  Musculoskeletal Global Assessment -Note: no gross deformities.  Normal Exam - Left-Upper Extremity Strength Normal and Lower Extremity Strength Normal. Normal Exam - Right-Upper Extremity Strength Normal and Lower Extremity Strength Normal.  Lymphatic Head & Neck  General Head & Neck Lymphatics: Bilateral - Description - Normal. Axillary  General Axillary Region: Bilateral - Description - Normal. Tenderness - Non Tender. Femoral & Inguinal  Generalized Femoral & Inguinal Lymphatics: Bilateral - Description - No Generalized lymphadenopathy.    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 I discussed the pros and cons of surgical treatment compared to other treatments such as ablation. She would prefer to have surgery as she is currently reasonably healthy other than the cancer. We would do this with a hand-assisted laparoscopic approach. I could use the prior incision for the hand port and fix her ventral hernia on the way out. I would not place mesh as there is a risk of infection if there is a bile leak.  I discussed the risk of surgery with the patient and her daughter. I reviewed  that she is at higher risk of bleeding given her anticoagulation therapy. I will contact her oncologist to see if she needs Lovenox bridging.  I discussed other risks including infection, bile leak, heart or lung complications, transfusion, possible need for additional surgeries or procedures, possible prolonged recovery, possible blood clot, and death. Patient understands and wants to proceed. We'll do this after the holidays. If she does need a bridge, we will set that up in conjunction with oncology. Also, the MRI can be done at [facility]. Current Plans You are being scheduled for surgery- Our schedulers will call you.  You should hear from our office's scheduling department within 5 working days about the location, date, and time of surgery. We try to make accommodations for patient's preferences in scheduling surgery, but sometimes the OR schedule or the surgeon's schedule prevents [place] from making those accommodations.  If you have not heard from our office [phone]) in 5 working days, call the office and ask for your surgeon's nurse.  If you have other questions about your diagnosis, plan, or surgery, call the office and ask for your surgeon's nurse.  Pt Education - flb hepatectomy: discussed with patient and provided information.   Signed by Stark Klein, MD

## 2017-12-02 NOTE — Anesthesia Preprocedure Evaluation (Addendum)
Anesthesia Evaluation  Patient identified by MRN, date of birth, ID band Patient awake    Reviewed: Allergy & Precautions, H&P , Patient's Chart, lab work & pertinent test results, reviewed documented beta blocker date and time   Airway Mallampati: III  TM Distance: >3 FB Neck ROM: full    Dental no notable dental hx. (+) Dental Advisory Given, Edentulous Upper, Missing,    Pulmonary former smoker,    Pulmonary exam normal breath sounds clear to auscultation       Cardiovascular hypertension,  Rhythm:regular Rate:Normal     Neuro/Psych    GI/Hepatic   Endo/Other  diabetes  Renal/GU      Musculoskeletal   Abdominal   Peds  Hematology   Anesthesia Other Findings Hypertension   Colon cancer  COPD      quit smoking 2015 Diabetes mellitus without complication  Hx of PE         Reproductive/Obstetrics                           Anesthesia Physical Anesthesia Plan  ASA: II  Anesthesia Plan: General   Post-op Pain Management:    Induction: Intravenous  PONV Risk Score and Plan: 2 and Dexamethasone, Ondansetron and Treatment may vary due to age or medical condition  Airway Management Planned: Oral ETT  Additional Equipment: Arterial line  Intra-op Plan:   Post-operative Plan: Extubation in OR and Possible Post-op intubation/ventilation  Informed Consent: I have reviewed the patients History and Physical, chart, labs and discussed the procedure including the risks, benefits and alternatives for the proposed anesthesia with the patient or authorized representative who has indicated his/her understanding and acceptance.   Dental Advisory Given  Plan Discussed with: CRNA and Surgeon  Anesthesia Plan Comments: (2 !V's  ( Hb 12.0   )   Check antibody screen    )      Anesthesia Quick Evaluation

## 2017-12-03 ENCOUNTER — Inpatient Hospital Stay (HOSPITAL_COMMUNITY): Payer: Medicare Other | Admitting: Vascular Surgery

## 2017-12-03 ENCOUNTER — Encounter (HOSPITAL_COMMUNITY)
Admission: RE | Disposition: A | Discharge status: Home / Self Care | Payer: Self-pay | Source: Ambulatory Visit | Attending: General Surgery

## 2017-12-03 ENCOUNTER — Other Ambulatory Visit: Payer: Self-pay

## 2017-12-03 ENCOUNTER — Inpatient Hospital Stay (HOSPITAL_COMMUNITY)
Admission: RE | Admit: 2017-12-03 | Discharge: 2017-12-08 | DRG: 354 | Disposition: A | Discharge status: Home / Self Care | Payer: Medicare Other | Source: Ambulatory Visit | Attending: General Surgery | Admitting: General Surgery

## 2017-12-03 ENCOUNTER — Encounter (HOSPITAL_COMMUNITY): Payer: Self-pay

## 2017-12-03 DIAGNOSIS — R7989 Other specified abnormal findings of blood chemistry: Secondary | ICD-10-CM | POA: Diagnosis not present

## 2017-12-03 DIAGNOSIS — C182 Malignant neoplasm of ascending colon: Secondary | ICD-10-CM

## 2017-12-03 DIAGNOSIS — E875 Hyperkalemia: Secondary | ICD-10-CM | POA: Diagnosis not present

## 2017-12-03 DIAGNOSIS — C189 Malignant neoplasm of colon, unspecified: Principal | ICD-10-CM | POA: Diagnosis present

## 2017-12-03 DIAGNOSIS — Z87891 Personal history of nicotine dependence: Secondary | ICD-10-CM

## 2017-12-03 DIAGNOSIS — N179 Acute kidney failure, unspecified: Secondary | ICD-10-CM | POA: Diagnosis present

## 2017-12-03 DIAGNOSIS — Z7984 Long term (current) use of oral hypoglycemic drugs: Secondary | ICD-10-CM | POA: Diagnosis not present

## 2017-12-03 DIAGNOSIS — M4856XA Collapsed vertebra, not elsewhere classified, lumbar region, initial encounter for fracture: Secondary | ICD-10-CM | POA: Diagnosis present

## 2017-12-03 DIAGNOSIS — R509 Fever, unspecified: Secondary | ICD-10-CM | POA: Diagnosis not present

## 2017-12-03 DIAGNOSIS — Z7901 Long term (current) use of anticoagulants: Secondary | ICD-10-CM | POA: Diagnosis not present

## 2017-12-03 DIAGNOSIS — G8918 Other acute postprocedural pain: Secondary | ICD-10-CM | POA: Diagnosis not present

## 2017-12-03 DIAGNOSIS — I10 Essential (primary) hypertension: Secondary | ICD-10-CM | POA: Diagnosis present

## 2017-12-03 DIAGNOSIS — Z800 Family history of malignant neoplasm of digestive organs: Secondary | ICD-10-CM | POA: Diagnosis not present

## 2017-12-03 DIAGNOSIS — D62 Acute posthemorrhagic anemia: Secondary | ICD-10-CM | POA: Diagnosis not present

## 2017-12-03 DIAGNOSIS — Z79899 Other long term (current) drug therapy: Secondary | ICD-10-CM

## 2017-12-03 DIAGNOSIS — Z86010 Personal history of colonic polyps: Secondary | ICD-10-CM | POA: Diagnosis not present

## 2017-12-03 DIAGNOSIS — R740 Nonspecific elevation of levels of transaminase and lactic acid dehydrogenase [LDH]: Secondary | ICD-10-CM | POA: Diagnosis not present

## 2017-12-03 DIAGNOSIS — E119 Type 2 diabetes mellitus without complications: Secondary | ICD-10-CM | POA: Diagnosis present

## 2017-12-03 DIAGNOSIS — C787 Secondary malignant neoplasm of liver and intrahepatic bile duct: Secondary | ICD-10-CM | POA: Diagnosis present

## 2017-12-03 DIAGNOSIS — K439 Ventral hernia without obstruction or gangrene: Secondary | ICD-10-CM | POA: Diagnosis present

## 2017-12-03 DIAGNOSIS — Z86711 Personal history of pulmonary embolism: Secondary | ICD-10-CM | POA: Diagnosis not present

## 2017-12-03 DIAGNOSIS — K5730 Diverticulosis of large intestine without perforation or abscess without bleeding: Secondary | ICD-10-CM | POA: Diagnosis present

## 2017-12-03 HISTORY — PX: LAPAROSCOPIC PARTIAL HEPATECTOMY: SHX5909

## 2017-12-03 HISTORY — PX: LAPAROSCOPIC INCISIONAL / UMBILICAL / VENTRAL HERNIA REPAIR: SUR789

## 2017-12-03 HISTORY — PX: VENTRAL HERNIA REPAIR: SHX424

## 2017-12-03 LAB — GLUCOSE, CAPILLARY: Glucose-Capillary: 134 mg/dL — ABNORMAL HIGH (ref 65–99)

## 2017-12-03 LAB — PREPARE RBC (CROSSMATCH)

## 2017-12-03 LAB — PROTIME-INR
INR: 1
Prothrombin Time: 13.1 seconds (ref 11.4–15.2)

## 2017-12-03 SURGERY — HEPATECTOMY, PARTIAL, LAPAROSCOPIC
Anesthesia: General

## 2017-12-03 MED ORDER — ACETAMINOPHEN 325 MG PO TABS
650.0000 mg | ORAL_TABLET | Freq: Four times a day (QID) | ORAL | Status: DC | PRN
Start: 2017-12-03 — End: 2017-12-08
  Administered 2017-12-06: 650 mg via ORAL
  Filled 2017-12-03: qty 2

## 2017-12-03 MED ORDER — HYDRALAZINE HCL 20 MG/ML IJ SOLN: 10.0000 mg | INTRAMUSCULAR | Status: DC | PRN

## 2017-12-03 MED ORDER — MIDAZOLAM HCL 5 MG/5ML IJ SOLN
INTRAMUSCULAR | Status: DC | PRN
  Administered 2017-12-03 (×2): 1 mg via INTRAVENOUS

## 2017-12-03 MED ORDER — MIDAZOLAM HCL 2 MG/2ML IJ SOLN
INTRAMUSCULAR | Status: AC
  Filled 2017-12-03: qty 2

## 2017-12-03 MED ORDER — ONDANSETRON HCL 4 MG/2ML IJ SOLN
INTRAMUSCULAR | Status: DC | PRN
  Administered 2017-12-03: 4 mg via INTRAVENOUS

## 2017-12-03 MED ORDER — FENTANYL CITRATE (PF) 250 MCG/5ML IJ SOLN
INTRAMUSCULAR | Status: AC
  Filled 2017-12-03: qty 5

## 2017-12-03 MED ORDER — PROPOFOL 10 MG/ML IV BOLUS
INTRAVENOUS | Status: DC | PRN
  Administered 2017-12-03: 140 mg via INTRAVENOUS
  Administered 2017-12-03: 20 mg via INTRAVENOUS

## 2017-12-03 MED ORDER — DIPHENHYDRAMINE HCL 12.5 MG/5ML PO ELIX: 12.5000 mg | ORAL_SOLUTION | Freq: Four times a day (QID) | ORAL | Status: DC | PRN

## 2017-12-03 MED ORDER — CARVEDILOL 3.125 MG PO TABS
3.1250 mg | ORAL_TABLET | Freq: Every day | ORAL | Status: DC
  Administered 2017-12-03 – 2017-12-08 (×6): 3.125 mg via ORAL
  Filled 2017-12-03 (×6): qty 1

## 2017-12-03 MED ORDER — CARVEDILOL 3.125 MG PO TABS
3.1250 mg | ORAL_TABLET | Freq: Once | ORAL | Status: AC
  Administered 2017-12-03: 3.125 mg via ORAL
  Filled 2017-12-03: qty 1

## 2017-12-03 MED ORDER — ROCURONIUM BROMIDE 10 MG/ML (PF) SYRINGE
PREFILLED_SYRINGE | INTRAVENOUS | Status: AC
  Filled 2017-12-03: qty 5

## 2017-12-03 MED ORDER — FENTANYL CITRATE (PF) 100 MCG/2ML IJ SOLN
INTRAMUSCULAR | Status: AC
  Filled 2017-12-03: qty 2

## 2017-12-03 MED ORDER — ESCITALOPRAM OXALATE 20 MG PO TABS
20.0000 mg | ORAL_TABLET | Freq: Every day | ORAL | Status: DC
  Administered 2017-12-03 – 2017-12-08 (×6): 20 mg via ORAL
  Filled 2017-12-03 (×6): qty 1

## 2017-12-03 MED ORDER — ALBUTEROL SULFATE HFA 108 (90 BASE) MCG/ACT IN AERS
INHALATION_SPRAY | RESPIRATORY_TRACT | Status: AC
  Filled 2017-12-03: qty 6.7

## 2017-12-03 MED ORDER — CEFAZOLIN SODIUM-DEXTROSE 2-4 GM/100ML-% IV SOLN
2.0000 g | INTRAVENOUS | Status: AC
  Administered 2017-12-03: 2 g via INTRAVENOUS
  Filled 2017-12-03: qty 100

## 2017-12-03 MED ORDER — PHENYLEPHRINE 40 MCG/ML (10ML) SYRINGE FOR IV PUSH (FOR BLOOD PRESSURE SUPPORT)
PREFILLED_SYRINGE | INTRAVENOUS | Status: AC
  Filled 2017-12-03: qty 30

## 2017-12-03 MED ORDER — PHENYLEPHRINE HCL 10 MG/ML IJ SOLN
INTRAVENOUS | Status: DC | PRN
  Administered 2017-12-03: 25 ug/min via INTRAVENOUS

## 2017-12-03 MED ORDER — SUCCINYLCHOLINE CHLORIDE 200 MG/10ML IV SOSY
PREFILLED_SYRINGE | INTRAVENOUS | Status: AC
  Filled 2017-12-03: qty 10

## 2017-12-03 MED ORDER — CEFAZOLIN SODIUM-DEXTROSE 2-4 GM/100ML-% IV SOLN
2.0000 g | Freq: Three times a day (TID) | INTRAVENOUS | Status: AC
  Administered 2017-12-03: 2 g via INTRAVENOUS
  Filled 2017-12-03: qty 100

## 2017-12-03 MED ORDER — ONDANSETRON HCL 4 MG/2ML IJ SOLN
4.0000 mg | Freq: Four times a day (QID) | INTRAMUSCULAR | Status: DC | PRN
Start: 2017-12-03 — End: 2017-12-08
  Administered 2017-12-03: 4 mg via INTRAVENOUS
  Filled 2017-12-03 (×2): qty 2

## 2017-12-03 MED ORDER — ALBUTEROL SULFATE HFA 108 (90 BASE) MCG/ACT IN AERS
INHALATION_SPRAY | RESPIRATORY_TRACT | Status: DC | PRN
  Administered 2017-12-03 (×2): 6 via RESPIRATORY_TRACT

## 2017-12-03 MED ORDER — DEXAMETHASONE SODIUM PHOSPHATE 10 MG/ML IJ SOLN
INTRAMUSCULAR | Status: DC | PRN
  Administered 2017-12-03: 5 mg via INTRAVENOUS

## 2017-12-03 MED ORDER — ACETAMINOPHEN 500 MG PO TABS
1000.0000 mg | ORAL_TABLET | ORAL | Status: AC
  Administered 2017-12-03: 1000 mg via ORAL
  Filled 2017-12-03: qty 2

## 2017-12-03 MED ORDER — FENTANYL CITRATE (PF) 100 MCG/2ML IJ SOLN: 25.0000 ug | INTRAMUSCULAR | Status: DC | PRN

## 2017-12-03 MED ORDER — KCL IN DEXTROSE-NACL 20-5-0.45 MEQ/L-%-% IV SOLN
INTRAVENOUS | Status: DC
  Administered 2017-12-03: 14:00:00 via INTRAVENOUS
  Administered 2017-12-04: 1 mL via INTRAVENOUS
  Administered 2017-12-04 – 2017-12-05 (×3): via INTRAVENOUS
  Filled 2017-12-03 (×7): qty 1000

## 2017-12-03 MED ORDER — SODIUM CHLORIDE 0.9 % IV SOLN: Freq: Once | INTRAVENOUS | Status: DC

## 2017-12-03 MED ORDER — HEMOSTATIC AGENTS (NO CHARGE) OPTIME
TOPICAL | Status: DC | PRN
  Administered 2017-12-03 (×2): 1 via TOPICAL

## 2017-12-03 MED ORDER — HYDROMORPHONE HCL 1 MG/ML IJ SOLN
0.5000 mg | INTRAMUSCULAR | Status: DC | PRN
  Administered 2017-12-03: 1 mg via INTRAVENOUS
  Filled 2017-12-03: qty 1

## 2017-12-03 MED ORDER — ACETAMINOPHEN 650 MG RE SUPP: 650.0000 mg | Freq: Four times a day (QID) | RECTAL | Status: DC | PRN

## 2017-12-03 MED ORDER — SODIUM CHLORIDE 0.9 % IR SOLN
Status: DC | PRN
  Administered 2017-12-03: 1000 mL

## 2017-12-03 MED ORDER — LIDOCAINE 2% (20 MG/ML) 5 ML SYRINGE
INTRAMUSCULAR | Status: DC | PRN
  Administered 2017-12-03: 100 mg via INTRAVENOUS

## 2017-12-03 MED ORDER — BUPIVACAINE-EPINEPHRINE (PF) 0.5% -1:200000 IJ SOLN
INTRAMUSCULAR | Status: AC
  Filled 2017-12-03: qty 30

## 2017-12-03 MED ORDER — 0.9 % SODIUM CHLORIDE (POUR BTL) OPTIME
TOPICAL | Status: DC | PRN
  Administered 2017-12-03 (×2): 1000 mL

## 2017-12-03 MED ORDER — DIPHENHYDRAMINE HCL 50 MG/ML IJ SOLN: 12.5000 mg | Freq: Four times a day (QID) | INTRAMUSCULAR | Status: DC | PRN

## 2017-12-03 MED ORDER — GABAPENTIN 300 MG PO CAPS
300.0000 mg | ORAL_CAPSULE | Freq: Two times a day (BID) | ORAL | Status: DC
  Administered 2017-12-03 – 2017-12-08 (×11): 300 mg via ORAL
  Filled 2017-12-03 (×11): qty 1

## 2017-12-03 MED ORDER — ONDANSETRON HCL 4 MG/2ML IJ SOLN
INTRAMUSCULAR | Status: AC
Start: 2017-12-03 — End: 2017-12-03
  Filled 2017-12-03: qty 2

## 2017-12-03 MED ORDER — METHOCARBAMOL 500 MG PO TABS
500.0000 mg | ORAL_TABLET | Freq: Four times a day (QID) | ORAL | Status: DC | PRN
  Administered 2017-12-03 – 2017-12-04 (×2): 500 mg via ORAL
  Filled 2017-12-03: qty 1

## 2017-12-03 MED ORDER — LIDOCAINE HCL 1 % IJ SOLN
INTRAMUSCULAR | Status: DC | PRN
  Administered 2017-12-03: 14 mL

## 2017-12-03 MED ORDER — OXYCODONE HCL 5 MG PO TABS
5.0000 mg | ORAL_TABLET | ORAL | Status: DC | PRN
  Administered 2017-12-03 – 2017-12-05 (×2): 10 mg via ORAL
  Administered 2017-12-06 – 2017-12-07 (×2): 5 mg via ORAL
  Administered 2017-12-08: 10 mg via ORAL
  Filled 2017-12-03 (×3): qty 2
  Filled 2017-12-03: qty 1
  Filled 2017-12-03: qty 2

## 2017-12-03 MED ORDER — EVICEL 2 ML EX KIT
PACK | CUTANEOUS | Status: AC
  Filled 2017-12-03: qty 1

## 2017-12-03 MED ORDER — GABAPENTIN 300 MG PO CAPS
300.0000 mg | ORAL_CAPSULE | ORAL | Status: AC
  Administered 2017-12-03: 300 mg via ORAL
  Filled 2017-12-03: qty 1

## 2017-12-03 MED ORDER — METHOCARBAMOL 500 MG PO TABS
ORAL_TABLET | ORAL | Status: AC
  Filled 2017-12-03: qty 1

## 2017-12-03 MED ORDER — CARVEDILOL 3.125 MG PO TABS
ORAL_TABLET | ORAL | Status: AC
  Administered 2017-12-03: 3.125 mg via ORAL
  Filled 2017-12-03: qty 1

## 2017-12-03 MED ORDER — EPHEDRINE 5 MG/ML INJ
INTRAVENOUS | Status: AC
  Filled 2017-12-03: qty 20

## 2017-12-03 MED ORDER — OXYCODONE HCL 5 MG PO TABS
ORAL_TABLET | ORAL | Status: AC
  Filled 2017-12-03: qty 2

## 2017-12-03 MED ORDER — LIDOCAINE 2% (20 MG/ML) 5 ML SYRINGE
INTRAMUSCULAR | Status: AC
  Filled 2017-12-03: qty 5

## 2017-12-03 MED ORDER — DEXAMETHASONE SODIUM PHOSPHATE 10 MG/ML IJ SOLN
INTRAMUSCULAR | Status: AC
  Filled 2017-12-03: qty 1

## 2017-12-03 MED ORDER — AMLODIPINE BESYLATE 2.5 MG PO TABS
2.5000 mg | ORAL_TABLET | Freq: Every evening | ORAL | Status: DC
  Administered 2017-12-04 – 2017-12-07 (×4): 2.5 mg via ORAL
  Filled 2017-12-03 (×4): qty 1

## 2017-12-03 MED ORDER — LACTATED RINGERS IV SOLN
INTRAVENOUS | Status: DC | PRN
  Administered 2017-12-03 (×2): via INTRAVENOUS

## 2017-12-03 MED ORDER — ONDANSETRON 4 MG PO TBDP: 4.0000 mg | ORAL_TABLET | Freq: Four times a day (QID) | ORAL | Status: DC | PRN

## 2017-12-03 MED ORDER — PHENYLEPHRINE 40 MCG/ML (10ML) SYRINGE FOR IV PUSH (FOR BLOOD PRESSURE SUPPORT)
PREFILLED_SYRINGE | INTRAVENOUS | Status: DC | PRN
  Administered 2017-12-03: 80 ug via INTRAVENOUS
  Administered 2017-12-03 (×5): 40 ug via INTRAVENOUS

## 2017-12-03 MED ORDER — TRAMADOL HCL 50 MG PO TABS
50.0000 mg | ORAL_TABLET | Freq: Four times a day (QID) | ORAL | Status: DC | PRN
  Administered 2017-12-04 – 2017-12-08 (×2): 50 mg via ORAL
  Filled 2017-12-03 (×3): qty 1

## 2017-12-03 MED ORDER — LACTATED RINGERS IV SOLN
INTRAVENOUS | Status: DC | PRN
  Administered 2017-12-03: 08:00:00 via INTRAVENOUS

## 2017-12-03 MED ORDER — CHLORHEXIDINE GLUCONATE CLOTH 2 % EX PADS: 6.0000 | MEDICATED_PAD | Freq: Once | CUTANEOUS | Status: DC

## 2017-12-03 MED ORDER — PROPOFOL 10 MG/ML IV BOLUS
INTRAVENOUS | Status: AC
  Filled 2017-12-03: qty 20

## 2017-12-03 MED ORDER — FENTANYL CITRATE (PF) 100 MCG/2ML IJ SOLN
25.0000 ug | INTRAMUSCULAR | Status: DC | PRN
  Administered 2017-12-03: 50 ug via INTRAVENOUS
  Administered 2017-12-03 (×2): 25 ug via INTRAVENOUS

## 2017-12-03 MED ORDER — ROCURONIUM BROMIDE 10 MG/ML (PF) SYRINGE
PREFILLED_SYRINGE | INTRAVENOUS | Status: DC | PRN
  Administered 2017-12-03 (×3): 20 mg via INTRAVENOUS
  Administered 2017-12-03: 50 mg via INTRAVENOUS

## 2017-12-03 MED ORDER — ONDANSETRON HCL 4 MG/2ML IJ SOLN
INTRAMUSCULAR | Status: AC
  Filled 2017-12-03: qty 2

## 2017-12-03 MED ORDER — SIMETHICONE 80 MG PO CHEW: 40.0000 mg | CHEWABLE_TABLET | Freq: Four times a day (QID) | ORAL | Status: DC | PRN

## 2017-12-03 MED ORDER — SUGAMMADEX SODIUM 200 MG/2ML IV SOLN
INTRAVENOUS | Status: DC | PRN
  Administered 2017-12-03: 148 mg via INTRAVENOUS

## 2017-12-03 MED ORDER — BUSPIRONE HCL 15 MG PO TABS
15.0000 mg | ORAL_TABLET | Freq: Two times a day (BID) | ORAL | Status: DC
  Administered 2017-12-03 – 2017-12-08 (×11): 15 mg via ORAL
  Filled 2017-12-03 (×11): qty 1

## 2017-12-03 MED ORDER — ZOLPIDEM TARTRATE 5 MG PO TABS
5.0000 mg | ORAL_TABLET | Freq: Every day | ORAL | Status: DC
  Administered 2017-12-03 – 2017-12-07 (×5): 5 mg via ORAL
  Filled 2017-12-03 (×5): qty 1

## 2017-12-03 MED ORDER — FENTANYL CITRATE (PF) 100 MCG/2ML IJ SOLN
INTRAMUSCULAR | Status: DC | PRN
  Administered 2017-12-03 (×3): 50 ug via INTRAVENOUS
  Administered 2017-12-03: 25 ug via INTRAVENOUS
  Administered 2017-12-03 (×2): 50 ug via INTRAVENOUS

## 2017-12-03 MED ORDER — LIDOCAINE HCL 1 % IJ SOLN
INTRAMUSCULAR | Status: AC
  Filled 2017-12-03: qty 20

## 2017-12-03 MED ORDER — BUPIVACAINE LIPOSOME 1.3 % IJ SUSP
20.0000 mL | INTRAMUSCULAR | Status: AC
  Administered 2017-12-03: 20 mL
  Filled 2017-12-03: qty 20

## 2017-12-03 SURGICAL SUPPLY — 96 items
APPLIER CLIP 5 13 M/L LIGAMAX5 (MISCELLANEOUS)
BAG BILE T-TUBES STRL (MISCELLANEOUS) ×3 IMPLANT
BIOPATCH RED 1 DISK 7.0 (GAUZE/BANDAGES/DRESSINGS) ×2 IMPLANT
BIOPATCH RED 1IN DISK 7.0MM (GAUZE/BANDAGES/DRESSINGS) ×1
BLADE CLIPPER SURG (BLADE) IMPLANT
BLADE SURG 10 STRL SS (BLADE) ×3 IMPLANT
CANISTER SUCT 3000ML PPV (MISCELLANEOUS) ×3 IMPLANT
CATH KIT ON Q 5IN SLV (PAIN MANAGEMENT) ×6 IMPLANT
CHLORAPREP W/TINT 26ML (MISCELLANEOUS) ×3 IMPLANT
CLIP APPLIE 5 13 M/L LIGAMAX5 (MISCELLANEOUS) IMPLANT
CLIP VESOCCLUDE LG 6/CT (CLIP) ×3 IMPLANT
CLIP VESOCCLUDE MED 6/CT (CLIP) ×3 IMPLANT
COVER MAYO STAND STRL (DRAPES) ×3 IMPLANT
COVER SURGICAL LIGHT HANDLE (MISCELLANEOUS) ×3 IMPLANT
DECANTER SPIKE VIAL GLASS SM (MISCELLANEOUS) ×6 IMPLANT
DERMABOND ADVANCED (GAUZE/BANDAGES/DRESSINGS) ×2
DERMABOND ADVANCED .7 DNX12 (GAUZE/BANDAGES/DRESSINGS) ×1 IMPLANT
DEVICE SECURE STRAP 25 ABSORB (INSTRUMENTS) ×3 IMPLANT
DEVICE TROCAR PUNCTURE CLOSURE (ENDOMECHANICALS) ×3 IMPLANT
DRAIN CHANNEL 19F RND (DRAIN) ×3 IMPLANT
DRAPE LAPAROSCOPIC ABDOMINAL (DRAPES) ×3 IMPLANT
DRAPE UTILITY XL STRL (DRAPES) ×6 IMPLANT
DRAPE WARM FLUID 44X44 (DRAPE) ×3 IMPLANT
DRSG COVADERM 4X10 (GAUZE/BANDAGES/DRESSINGS) IMPLANT
DRSG COVADERM 4X14 (GAUZE/BANDAGES/DRESSINGS) IMPLANT
DRSG OPSITE POSTOP 4X6 (GAUZE/BANDAGES/DRESSINGS) ×3 IMPLANT
DRSG TEGADERM 4X4.75 (GAUZE/BANDAGES/DRESSINGS) ×3 IMPLANT
ELECT BLADE 6.5 EXT (BLADE) ×3 IMPLANT
ELECT CAUTERY BLADE 6.4 (BLADE) ×3 IMPLANT
ELECT REM PT RETURN 9FT ADLT (ELECTROSURGICAL) ×3
ELECTRODE REM PT RTRN 9FT ADLT (ELECTROSURGICAL) ×1 IMPLANT
EVACUATOR SILICONE 100CC (DRAIN) IMPLANT
GAUZE SPONGE 4X4 12PLY STRL (GAUZE/BANDAGES/DRESSINGS) IMPLANT
GLOVE BIO SURGEON STRL SZ 6 (GLOVE) ×3 IMPLANT
GLOVE BIOGEL PI IND STRL 7.0 (GLOVE) ×1 IMPLANT
GLOVE BIOGEL PI INDICATOR 7.0 (GLOVE) ×2
GLOVE ECLIPSE 6.0 STRL STRAW (GLOVE) ×3 IMPLANT
GLOVE ECLIPSE 7.0 STRL STRAW (GLOVE) ×3 IMPLANT
GLOVE INDICATOR 6.5 STRL GRN (GLOVE) ×3 IMPLANT
GOWN STRL REUS W/ TWL LRG LVL3 (GOWN DISPOSABLE) ×3 IMPLANT
GOWN STRL REUS W/TWL 2XL LVL3 (GOWN DISPOSABLE) ×3 IMPLANT
GOWN STRL REUS W/TWL LRG LVL3 (GOWN DISPOSABLE) ×6
HAND PENCIL TRP OPTION (MISCELLANEOUS) IMPLANT
HEMOSTAT SNOW SURGICEL 2X4 (HEMOSTASIS) ×6 IMPLANT
KIT BASIN OR (CUSTOM PROCEDURE TRAY) ×3 IMPLANT
KIT ROOM TURNOVER OR (KITS) ×3 IMPLANT
L-HOOK LAP DISP 36CM (ELECTROSURGICAL) ×3
LHOOK LAP DISP 36CM (ELECTROSURGICAL) ×1 IMPLANT
MARKER SKIN DUAL TIP RULER LAB (MISCELLANEOUS) ×3 IMPLANT
NEEDLE SPNL 18GX3.5 QUINCKE PK (NEEDLE) ×3 IMPLANT
NS IRRIG 1000ML POUR BTL (IV SOLUTION) ×6 IMPLANT
PAD ARMBOARD 7.5X6 YLW CONV (MISCELLANEOUS) ×6 IMPLANT
PENCIL BUTTON HOLSTER BLD 10FT (ELECTRODE) ×3 IMPLANT
POUCH SPECIMEN RETRIEVAL 10MM (ENDOMECHANICALS) IMPLANT
RELOAD STAPLER WHITE 60MM (STAPLE) ×5 IMPLANT
SCISSORS LAP 5X35 DISP (ENDOMECHANICALS) ×3 IMPLANT
SEALANT SURGICAL APPL DUAL CAN (MISCELLANEOUS) IMPLANT
SET IRRIG TUBING LAPAROSCOPIC (IRRIGATION / IRRIGATOR) IMPLANT
SHEARS HARMONIC ACE PLUS 36CM (ENDOMECHANICALS) ×3 IMPLANT
SLEEVE ENDOPATH XCEL 5M (ENDOMECHANICALS) ×12 IMPLANT
SLEEVE SURGEON STRL (DRAPES) ×6 IMPLANT
STAPLE ECHEON FLEX 60 POW ENDO (STAPLE) ×3 IMPLANT
STAPLER RELOAD WHITE 60MM (STAPLE) ×15
STAPLER VISISTAT 35W (STAPLE) IMPLANT
SUT ETHILON 2 0 FS 18 (SUTURE) ×3 IMPLANT
SUT MNCRL AB 4-0 PS2 18 (SUTURE) ×3 IMPLANT
SUT MON AB 4-0 PC3 18 (SUTURE) ×3 IMPLANT
SUT NOVA NAB DX-16 0-1 5-0 T12 (SUTURE) ×3 IMPLANT
SUT PDS AB 1 TP1 96 (SUTURE) ×6 IMPLANT
SUT PDS II 0 TP-1 LOOPED 60 (SUTURE) IMPLANT
SUT PROLENE 3 0 SH 48 (SUTURE) ×6 IMPLANT
SUT PROLENE 4 0 RB 1 (SUTURE) ×4
SUT PROLENE 4-0 RB1 .5 CRCL 36 (SUTURE) ×2 IMPLANT
SUT VIC AB 2-0 SH 18 (SUTURE) ×3 IMPLANT
SUT VIC AB 3-0 SH 18 (SUTURE) ×3 IMPLANT
SUT VICRYL AB 2 0 TIES (SUTURE) ×3 IMPLANT
SUT VICRYL AB 3 0 TIES (SUTURE) ×3 IMPLANT
SYS LAPSCP GELPORT 120MM (MISCELLANEOUS) ×3
SYSTEM LAPSCP GELPORT 120MM (MISCELLANEOUS) ×1 IMPLANT
TOWEL OR 17X24 6PK STRL BLUE (TOWEL DISPOSABLE) ×3 IMPLANT
TOWEL OR 17X26 10 PK STRL BLUE (TOWEL DISPOSABLE) ×3 IMPLANT
TRAY FOLEY CATH SILVER 16FR (SET/KITS/TRAYS/PACK) IMPLANT
TRAY FOLEY W/METER SILVER 14FR (SET/KITS/TRAYS/PACK) IMPLANT
TRAY LAPAROSCOPIC MC (CUSTOM PROCEDURE TRAY) ×3 IMPLANT
TROCAR BLADELESS 12MM (ENDOMECHANICALS) ×3 IMPLANT
TROCAR XCEL 12X100 BLDLESS (ENDOMECHANICALS) ×3 IMPLANT
TROCAR XCEL BLUNT TIP 100MML (ENDOMECHANICALS) ×3 IMPLANT
TROCAR XCEL NON-BLD 11X100MML (ENDOMECHANICALS) IMPLANT
TROCAR XCEL NON-BLD 5MMX100MML (ENDOMECHANICALS) ×3 IMPLANT
TUBE CONNECTING 12'X1/4 (SUCTIONS) ×1
TUBE CONNECTING 12X1/4 (SUCTIONS) ×2 IMPLANT
TUBING INSUF HEATED (TUBING) IMPLANT
TUBING INSUFFLATION (TUBING) ×3 IMPLANT
TUNNELER SHEATH ON-Q 16GX12 DP (PAIN MANAGEMENT) ×3 IMPLANT
WATER STERILE IRR 1000ML POUR (IV SOLUTION) ×3 IMPLANT
YANKAUER SUCT BULB TIP NO VENT (SUCTIONS) ×3 IMPLANT

## 2017-12-03 NOTE — Op Note (Signed)
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  FINDINGS:  Firm 2-3 cm mass in the left lateral segment of the liver as seen on previous scans.  No other evidence of cancer is seen in the liver and the peritoneal cavity  EBL: 75 mL  PROCEDURE:  Patient was identified in the holding area and taken to the operating room where she was placed supine on the operating room table.  SCDs were placed.  General endotracheal anesthesia was induced.  Foley catheter was placed, her arms were tucked, and her abdomen was prepped and draped in sterile fashion.  Timeout was performed according to the surgical safety checklist.  When all was correct, we continued.  Patient was placed into reverse Trendelenburg position and rotated to the left.  She had local anesthetic was administered at the left costal margin.  A 5 mm trocar was placed under direct visualization using the Optiview technique.  Pneumoperitoneum was achieved to pressure 15 mmHg.  The mass was immediately seen.  There was no additional evidence of carcinomatosis or liver mass is seen.    A second trocar was placed in the left abdomen to take down the adhesions to the midline incision from her colectomy.  This was done using sharp dissection as well as Harmonic dissection on the omentum.  The there was a hernia seen at the incision.  A Hassan  trocar was placed through the hernia which was just above the umbilicus.  This was secured to the fascial wall.  An additional trocar was placed in the left abdomen and one in the right abdomen.  The falciform was taken down with the harmonic scalpel and used as a point of retraction.  The liver was heavy on the left side and difficult to retract so the [facility] trocar was upsized to a hand port.  The liver was then able to be retracted more leftward.    The harmonic scalpel was used to take down the superficial portion of the liver.  The Echelon stapler was used to divide the larger vascular and biliary structures.  The left lateral segment of the liver was then removed through the hand port.  This is examined and there appeared to be adequate margin on the liver mass.  The liver edge was examined for bleeding and no bleeding was seen.  This was irrigated and reexamined.  SNOW hemostatic agent was placed along the raw surface of the liver.  A 19 [place] Blake drain was placed along the raw surface of the liver.  This was secured with a 2-0 nylon.  The 5 mm ports were removed and the pneumoperitoneum was allowed to evacuate.  The fascia was then closed with primary closure of the ventral hernia using running #1 looped PDS with interrupted #1 Novafil retention sutures.  The abdominal wall of the hand port incision was infiltrated with Exparel to assist with pain control.  The skin of all the incisions was closed using sutures.  The port site was closed with 4-0 Monocryl in subcuticular fashion.  The hand port was closed with interrupted 3-0 Vicryl deep dermal sutures and 4-0 Monocryl running subcuticular suture.  The wounds were cleaned, dried, and the port sites were closed with Dermabond and the midline was dressed with a small honeycomb dressing.    The patient was allowed to emerge from anesthesia and was taken to the PACU in stable condition.  Needle, sponge, and instrument counts were correct x2.

## 2017-12-03 NOTE — Anesthesia Procedure Notes (Addendum)
Procedure Name: Intubation Date/Time: 12/03/2017 7:50 AM Performed by: Harden Mo, CRNA Pre-anesthesia Checklist: Emergency Drugs available, Patient identified, Suction available and Patient being monitored Patient Re-evaluated:Patient Re-evaluated prior to induction Oxygen Delivery Method: Circle system utilized Preoxygenation: Pre-oxygenation with 100% oxygen Induction Type: IV induction Ventilation: Oral airway inserted - appropriate to patient size and Mask ventilation without difficulty Laryngoscope Size: Miller and 2 Grade View: Grade I Tube type: Oral Tube size: 7.5 mm Number of attempts: 1 Airway Equipment and Method: Stylet Placement Confirmation: ETT inserted through vocal cords under direct vision,  positive ETCO2 and breath sounds checked- equal and bilateral Secured at: 21 cm Tube secured with: Tape Dental Injury: Teeth and Oropharynx as per pre-operative assessment  Comments: Intubation by Levie Heritage, SRNA

## 2017-12-03 NOTE — Anesthesia Procedure Notes (Signed)
Arterial Line Insertion Start/End1/31/2019 7:00 AM, 12/03/2017 7:10 AM Performed by: CRNA  Patient location: Pre-op. Preanesthetic checklist: patient identified, IV checked, site marked, risks and benefits discussed, surgical consent, monitors and equipment checked, pre-op evaluation and anesthesia consent Lidocaine 1% used for infiltration Left, radial was placed Catheter size: 20 G Hand hygiene performed  and maximum sterile barriers used   Attempts: 2 Procedure performed without using ultrasound guided technique. Ultrasound Notes:anatomy identified, needle tip was noted to be adjacent to the nerve/plexus identified and no ultrasound evidence of intravascular and/or intraneural injection Following insertion, dressing applied and Biopatch. Post procedure assessment: normal  Patient tolerated the procedure well with no immediate complications.

## 2017-12-03 NOTE — Transfer of Care (Signed)
Immediate Anesthesia Transfer of Care Note  Patient: Janet Hanson  Procedure(s) Performed: HAND ASSISTED LAPAROSCOPIC PARTIAL HEPATECTOMY (N/A ) LAPAROSCOPIC VENTRAL HERNIA REPAIR (N/A )  Patient Location: PACU  Anesthesia Type:General  Level of Consciousness: awake and alert   Airway & Oxygen Therapy: Patient Spontanous Breathing and Patient connected to face mask oxygen  Post-op Assessment: Report given to RN, Post -op Vital signs reviewed and stable and Patient moving all extremities X 4  Post vital signs: Reviewed and stable  Last Vitals:  Vitals:   12/03/17 0558  BP: (!) 127/91  Pulse: (!) 105  Resp: 18  Temp: 36.8 C  SpO2: 95%    Last Pain:  Vitals:   12/03/17 0558  TempSrc: Oral         Complications: No apparent anesthesia complications

## 2017-12-03 NOTE — Anesthesia Postprocedure Evaluation (Signed)
Anesthesia Post Note  Patient: KHALEA VENTURA  Procedure(s) Performed: HAND ASSISTED LAPAROSCOPIC PARTIAL HEPATECTOMY (N/A ) LAPAROSCOPIC VENTRAL HERNIA REPAIR (N/A )     Patient location during evaluation: PACU Anesthesia Type: General Level of consciousness: awake and alert Pain management: pain level controlled Vital Signs Assessment: post-procedure vital signs reviewed and stable Respiratory status: spontaneous breathing, nonlabored ventilation, respiratory function stable and patient connected to nasal cannula oxygen Cardiovascular status: blood pressure returned to baseline and stable Postop Assessment: no apparent nausea or vomiting Anesthetic complications: no    Last Vitals:  Vitals:   12/03/17 1241 12/03/17 1301  BP: (!) 143/95 135/89  Pulse:  88  Resp:  18  Temp:  36.8 C  SpO2:  98%    Last Pain:  Vitals:   12/03/17 1301  TempSrc: Oral  PainSc:                  Riccardo Dubin

## 2017-12-03 NOTE — Interval H&P Note (Signed)
History and Physical Interval Note:  12/03/2017 7:35 AM  Janet Hanson  has presented today for surgery, with the diagnosis of colon cancer metastatic to liver and ventral incisional hernia  The various methods of treatment have been discussed with the patient and family. After consideration of risks, benefits and other options for treatment, the patient has consented to  Procedure(s): HAND ASSISTED LAPAROSCOPIC PARTIAL HEPATECTOMY (N/A) LAPAROSCOPIC VENTRAL HERNIA REPAIR (N/A) as a surgical intervention .  The patient's history has been reviewed, patient examined, no change in status, stable for surgery.  I have reviewed the patient's chart and labs.  Questions were answered to the patient's satisfaction.     Stark Klein

## 2017-12-03 NOTE — Progress Notes (Signed)
Patient arrived to 6N16 A&Ox4, VSS, LFA IV intact and infusing and RFA IV intact and saline locked.  Noted to have midline incision with honeycomb dressing and right hepatic drain to gravity.  Insertion site of drain is leaking but has been reinforced with drain sponges.  Patient states pain 6/10 at this time.  Family at bedside.  Patient and family oriented to room and equipment.  Will continue to monitor.

## 2017-12-04 ENCOUNTER — Encounter (HOSPITAL_COMMUNITY): Payer: Self-pay | Admitting: General Surgery

## 2017-12-04 LAB — GLUCOSE, CAPILLARY: GLUCOSE-CAPILLARY: 132 mg/dL — AB (ref 65–99)

## 2017-12-04 LAB — CBC
HCT: 29.8 % — ABNORMAL LOW (ref 36.0–46.0)
Hemoglobin: 9.4 g/dL — ABNORMAL LOW (ref 12.0–15.0)
MCH: 30.8 pg (ref 26.0–34.0)
MCHC: 31.5 g/dL (ref 30.0–36.0)
MCV: 97.7 fL (ref 78.0–100.0)
Platelets: 233 10*3/uL (ref 150–400)
RBC: 3.05 MIL/uL — ABNORMAL LOW (ref 3.87–5.11)
RDW: 14 % (ref 11.5–15.5)
WBC: 10.7 10*3/uL — AB (ref 4.0–10.5)

## 2017-12-04 LAB — COMPREHENSIVE METABOLIC PANEL
ALBUMIN: 3.2 g/dL — AB (ref 3.5–5.0)
ALT: 69 U/L — ABNORMAL HIGH (ref 14–54)
AST: 104 U/L — AB (ref 15–41)
Alkaline Phosphatase: 50 U/L (ref 38–126)
Anion gap: 7 (ref 5–15)
BUN: 12 mg/dL (ref 6–20)
CHLORIDE: 100 mmol/L — AB (ref 101–111)
CO2: 27 mmol/L (ref 22–32)
Calcium: 8.4 mg/dL — ABNORMAL LOW (ref 8.9–10.3)
Creatinine, Ser: 0.73 mg/dL (ref 0.44–1.00)
GFR calc Af Amer: >60 mL/min (ref >60)
GFR calc non Af Amer: >60 mL/min (ref >60)
GLUCOSE: 143 mg/dL — AB (ref 65–99)
POTASSIUM: 4.7 mmol/L (ref 3.5–5.1)
SODIUM: 134 mmol/L — AB (ref 135–145)
Total Bilirubin: 1 mg/dL (ref 0.3–1.2)
Total Protein: 6 g/dL — ABNORMAL LOW (ref 6.5–8.1)

## 2017-12-04 LAB — PROTIME-INR
INR: 1.12
Prothrombin Time: 14.3 seconds (ref 11.4–15.2)

## 2017-12-04 LAB — PHOSPHORUS: Phosphorus: 3.7 mg/dL (ref 2.5–4.6)

## 2017-12-04 LAB — MAGNESIUM: MAGNESIUM: 2 mg/dL (ref 1.7–2.4)

## 2017-12-04 NOTE — Progress Notes (Signed)
1 Day Post-Op   Subjective/Chief Complaint: Sore, but pain is tolerable.     Objective: Vital signs in last 24 hours: Temp:  [98.1 F (36.7 C)-99.1 F (37.3 C)] 99.1 F (37.3 C) (02/01 1456) Pulse Rate:  [85-97] 88 (02/01 1456) Resp:  [16-19] 18 (02/01 1456) BP: (104-139)/(66-88) 139/84 (02/01 1456) SpO2:  [98 %-100 %] 100 % (02/01 1456) Last BM Date: (PTA)  Intake/Output from previous day: 01/31 0701 - 02/01 0700 In: 2991.7 [P.O.:60; I.V.:2931.7] Out: 2390 [Urine:2165; Drains:125; Blood:100] Intake/Output this shift: Total I/O In: -  Out: 1000 [Urine:1000]  General appearance: alert and cooperative Resp: breathing comfortably GI: soft, minimally distended.  dressing c/d/i.  approp tender. Extremities: extremities normal, atraumatic, no cyanosis or edema  Lab Results:  Recent Labs    12/04/17 0537  WBC 10.7*  HGB 9.4*  HCT 29.8*  PLT 233   BMET Recent Labs    12/04/17 0537  NA 134*  K 4.7  CL 100*  CO2 27  GLUCOSE 143*  BUN 12  CREATININE 0.73  CALCIUM 8.4*   PT/INR Recent Labs    12/03/17 0627 12/04/17 0537  LABPROT 13.1 14.3  INR 1.00 1.12   ABG No results for input(s): PHART, HCO3 in the last 72 hours.  Invalid input(s): PCO2, PO2  Studies/Results: No results found.  Anti-infectives: Anti-infectives (From admission, onward)   Start     Dose/Rate Route Frequency Ordered Stop   12/03/17 1400  ceFAZolin (ANCEF) IVPB 2g/100 mL premix     2 g 200 mL/hr over 30 Minutes Intravenous Every 8 hours 12/03/17 1304 12/03/17 1554   12/03/17 0551  ceFAZolin (ANCEF) IVPB 2g/100 mL premix     2 g 200 mL/hr over 30 Minutes Intravenous On call to O.R. 12/03/17 0551 12/03/17 0847      Assessment/Plan: s/p Procedure(s): HAND ASSISTED LAPAROSCOPIC PARTIAL HEPATECTOMY (N/A) LAPAROSCOPIC VENTRAL HERNIA REPAIR (N/A) Continue foley due to urinary output monitoring SCDs  lovenox to start tomorrow Ambulate ABL anemia Pain control Will hold on diet  advance today due to nausea.    LOS: 1 day    Stark Klein 12/04/2017

## 2017-12-05 LAB — COMPREHENSIVE METABOLIC PANEL
ALT: 56 U/L — AB (ref 14–54)
AST: 57 U/L — AB (ref 15–41)
Albumin: 3.1 g/dL — ABNORMAL LOW (ref 3.5–5.0)
Alkaline Phosphatase: 48 U/L (ref 38–126)
Anion gap: 10 (ref 5–15)
CHLORIDE: 97 mmol/L — AB (ref 101–111)
CO2: 29 mmol/L (ref 22–32)
Calcium: 8.7 mg/dL — ABNORMAL LOW (ref 8.9–10.3)
Creatinine, Ser: 0.59 mg/dL (ref 0.44–1.00)
GFR calc Af Amer: >60 mL/min (ref >60)
Glucose, Bld: 130 mg/dL — ABNORMAL HIGH (ref 65–99)
POTASSIUM: 4 mmol/L (ref 3.5–5.1)
SODIUM: 136 mmol/L (ref 135–145)
Total Bilirubin: 0.8 mg/dL (ref 0.3–1.2)
Total Protein: 6.4 g/dL — ABNORMAL LOW (ref 6.5–8.1)

## 2017-12-05 LAB — CBC
HCT: 30.8 % — ABNORMAL LOW (ref 36.0–46.0)
Hemoglobin: 9.8 g/dL — ABNORMAL LOW (ref 12.0–15.0)
MCH: 31 pg (ref 26.0–34.0)
MCHC: 31.8 g/dL (ref 30.0–36.0)
MCV: 97.5 fL (ref 78.0–100.0)
PLATELETS: 220 10*3/uL (ref 150–400)
RBC: 3.16 MIL/uL — AB (ref 3.87–5.11)
RDW: 14.1 % (ref 11.5–15.5)
WBC: 9 10*3/uL (ref 4.0–10.5)

## 2017-12-05 MED ORDER — ENOXAPARIN SODIUM 40 MG/0.4ML ~~LOC~~ SOLN
40.0000 mg | SUBCUTANEOUS | Status: DC
  Administered 2017-12-05 – 2017-12-08 (×4): 40 mg via SUBCUTANEOUS
  Filled 2017-12-05 (×4): qty 0.4

## 2017-12-05 NOTE — Progress Notes (Signed)
2 Days Post-Op  Subjective: Alert and stable.  States she is feeling okay without nausea.  Started to get up a little.  No stool or flatus yet. JP drainage serosanguineous.  No bile. 83 mL out. Hemoglobin stable.  9.8.  Chemistries looked fine.  Mild elevation of transaminases.  Objective: Vital signs in last 24 hours: Temp:  [97.9 F (36.6 C)-99.1 F (37.3 C)] 97.9 F (36.6 C) (02/02 0434) Pulse Rate:  [88-102] 90 (02/02 0434) Resp:  [17-18] 18 (02/02 0434) BP: (126-145)/(66-88) 126/88 (02/02 0434) SpO2:  [90 %-100 %] 95 % (02/02 0550) Last BM Date: 12/03/17  Intake/Output from previous day: 02/01 0701 - 02/02 0700 In: 2566.7 [P.O.:120; I.V.:2446.7] Out: 2433 [Urine:2350; Drains:83] Intake/Output this shift: No intake/output data recorded.  General appearance: alert.  No distress.  Slightly deconditioned Resp: clear to auscultation bilaterally GI: soft.  Nondistended.  Incision clean.  Drainage is serosanguineous.  occasional bowel soundsBenign postop exam Extremities: no edema cyanosis.  Tenderness.  Lab Results:  Results for orders placed or performed during the hospital encounter of 12/03/17 (from the past 24 hour(s))  Glucose, capillary     Status: Abnormal   Collection Time: 12/04/17  8:34 PM  Result Value Ref Range   Glucose-Capillary 132 (H) 65 - 99 mg/dL  CBC     Status: Abnormal   Collection Time: 12/05/17  6:08 AM  Result Value Ref Range   WBC 9.0 4.0 - 10.5 K/uL   RBC 3.16 (L) 3.87 - 5.11 MIL/uL   Hemoglobin 9.8 (L) 12.0 - 15.0 g/dL   HCT 30.8 (L) 36.0 - 46.0 %   MCV 97.5 78.0 - 100.0 fL   MCH 31.0 26.0 - 34.0 pg   MCHC 31.8 30.0 - 36.0 g/dL   RDW 14.1 11.5 - 15.5 %   Platelets 220 150 - 400 K/uL  Comprehensive metabolic panel     Status: Abnormal   Collection Time: 12/05/17  6:08 AM  Result Value Ref Range   Sodium 136 135 - 145 mmol/L   Potassium 4.0 3.5 - 5.1 mmol/L   Chloride 97 (L) 101 - 111 mmol/L   CO2 29 22 - 32 mmol/L   Glucose, Bld 130 (H)  65 - 99 mg/dL   BUN <5 (L) 6 - 20 mg/dL   Creatinine, Ser 0.59 0.44 - 1.00 mg/dL   Calcium 8.7 (L) 8.9 - 10.3 mg/dL   Total Protein 6.4 (L) 6.5 - 8.1 g/dL   Albumin 3.1 (L) 3.5 - 5.0 g/dL   AST 57 (H) 15 - 41 U/L   ALT 56 (H) 14 - 54 U/L   Alkaline Phosphatase 48 38 - 126 U/L   Total Bilirubin 0.8 0.3 - 1.2 mg/dL   GFR calc non Af Amer >60 >60 mL/min   GFR calc Af Amer >60 >60 mL/min   Anion gap 10 5 - 15     Studies/Results: No results found.  Marland Kitchen amLODipine  2.5 mg Oral QPM  . busPIRone  15 mg Oral BID  . carvedilol  3.125 mg Oral Daily  . escitalopram  20 mg Oral Daily  . gabapentin  300 mg Oral BID  . zolpidem  5 mg Oral QHS     Assessment/Plan: s/p Procedure(s): HAND ASSISTED LAPAROSCOPIC PARTIAL HEPATECTOMY LAPAROSCOPIC VENTRAL HERNIA REPAIR  POD#2 - hand-assisted laparoscopic resection, left lateral segment of liver. -Stable -Advance to full liquids -Push ambulation -DC Foley -should be safe to start Lovenox for DVT prophylaxis.  Pharmacy to dose. -labs tomorrow  @  PROBHOSP@  LOS: 2 days    [name] 12/05/2017  . .prob

## 2017-12-06 LAB — CBC
HCT: 29.6 % — ABNORMAL LOW (ref 36.0–46.0)
Hemoglobin: 9 g/dL — ABNORMAL LOW (ref 12.0–15.0)
MCH: 30 pg (ref 26.0–34.0)
MCHC: 30.4 g/dL (ref 30.0–36.0)
MCV: 98.7 fL (ref 78.0–100.0)
PLATELETS: 240 10*3/uL (ref 150–400)
RBC: 3 MIL/uL — ABNORMAL LOW (ref 3.87–5.11)
RDW: 14.6 % (ref 11.5–15.5)
WBC: 8.9 10*3/uL (ref 4.0–10.5)

## 2017-12-06 LAB — COMPREHENSIVE METABOLIC PANEL
ALT: 36 U/L (ref 14–54)
ANION GAP: 8 (ref 5–15)
AST: 45 U/L — AB (ref 15–41)
Albumin: 3 g/dL — ABNORMAL LOW (ref 3.5–5.0)
Alkaline Phosphatase: 48 U/L (ref 38–126)
BUN: 9 mg/dL (ref 6–20)
CHLORIDE: 101 mmol/L (ref 101–111)
CO2: 26 mmol/L (ref 22–32)
Calcium: 8.5 mg/dL — ABNORMAL LOW (ref 8.9–10.3)
Creatinine, Ser: 1.28 mg/dL — ABNORMAL HIGH (ref 0.44–1.00)
GFR calc Af Amer: 48 mL/min — ABNORMAL LOW (ref >60)
GFR, EST NON AFRICAN AMERICAN: 41 mL/min — AB (ref >60)
Glucose, Bld: 111 mg/dL — ABNORMAL HIGH (ref 65–99)
POTASSIUM: 5.3 mmol/L — AB (ref 3.5–5.1)
Sodium: 135 mmol/L (ref 135–145)
TOTAL PROTEIN: 5.8 g/dL — AB (ref 6.5–8.1)
Total Bilirubin: 1.2 mg/dL (ref 0.3–1.2)

## 2017-12-06 MED ORDER — DEXTROSE-NACL 5-0.45 % IV SOLN
INTRAVENOUS | Status: DC
  Administered 2017-12-06 (×2): via INTRAVENOUS

## 2017-12-06 NOTE — Progress Notes (Signed)
3 Days Post-Op  Subjective: Alert and stable.  Doing well Tolerating full liquids Lots of flatus but no stool JP drainage serosanguineous, low output.  No bile.  Labs show hemoglobin 9.0.  Stable.  Potassium 5.3.  Calcium taken out IVs.  Creatinine 1.28 which is somewhat elevated from baseline.  Objective: Vital signs in last 24 hours: Temp:  [97.6 F (36.4 C)-98.9 F (37.2 C)] 97.6 F (36.4 C) (02/03 0423) Pulse Rate:  [98-102] 98 (02/03 0423) Resp:  [17] 17 (02/03 0423) BP: (100-120)/(72-83) 100/72 (02/03 0423) SpO2:  [90 %-92 %] 91 % (02/03 0423) Last BM Date: (pre op)  Intake/Output from previous day: 02/02 0701 - 02/03 0700 In: 2703.3 [P.O.:460; I.V.:2243.3] Out: 680 [Urine:650; Drains:30] Intake/Output this shift: No intake/output data recorded.   PE General appearance: alert.  No distress.  Slightly deconditioned Resp: clear to auscultation bilaterally GI: soft.  Nondistended.  Incision clean.  Drainage is serosanguineous.  occasional bowel sounds, Benign postop exam Extremities: no edema cyanosis, or tenderness.     Lab Results:  Results for orders placed or performed during the hospital encounter of 12/03/17 (from the past 24 hour(s))  CBC     Status: Abnormal   Collection Time: 12/06/17  5:12 AM  Result Value Ref Range   WBC 8.9 4.0 - 10.5 K/uL   RBC 3.00 (L) 3.87 - 5.11 MIL/uL   Hemoglobin 9.0 (L) 12.0 - 15.0 g/dL   HCT 29.6 (L) 36.0 - 46.0 %   MCV 98.7 78.0 - 100.0 fL   MCH 30.0 26.0 - 34.0 pg   MCHC 30.4 30.0 - 36.0 g/dL   RDW 14.6 11.5 - 15.5 %   Platelets 240 150 - 400 K/uL  Comprehensive metabolic panel     Status: Abnormal   Collection Time: 12/06/17  5:12 AM  Result Value Ref Range   Sodium 135 135 - 145 mmol/L   Potassium 5.3 (H) 3.5 - 5.1 mmol/L   Chloride 101 101 - 111 mmol/L   CO2 26 22 - 32 mmol/L   Glucose, Bld 111 (H) 65 - 99 mg/dL   BUN 9 6 - 20 mg/dL   Creatinine, Ser 1.28 (H) 0.44 - 1.00 mg/dL   Calcium 8.5 (L) 8.9 - 10.3 mg/dL    Total Protein 5.8 (L) 6.5 - 8.1 g/dL   Albumin 3.0 (L) 3.5 - 5.0 g/dL   AST 45 (H) 15 - 41 U/L   ALT 36 14 - 54 U/L   Alkaline Phosphatase 48 38 - 126 U/L   Total Bilirubin 1.2 0.3 - 1.2 mg/dL   GFR calc non Af Amer 41 (L) >60 mL/min   GFR calc Af Amer 48 (L) >60 mL/min   Anion gap 8 5 - 15     Studies/Results: No results found.  Marland Kitchen amLODipine  2.5 mg Oral QPM  . busPIRone  15 mg Oral BID  . carvedilol  3.125 mg Oral Daily  . enoxaparin (LOVENOX) injection  40 mg Subcutaneous Q24H  . escitalopram  20 mg Oral Daily  . gabapentin  300 mg Oral BID  . zolpidem  5 mg Oral QHS     Assessment/Plan: s/p Procedure(s): HAND ASSISTED LAPAROSCOPIC PARTIAL HEPATECTOMY LAPAROSCOPIC VENTRAL HERNIA REPAIR  POD#3 - hand-assisted laparoscopic resection, left lateral segment of liver. -Stable -Advance to soft diet -Push ambulation -possible discharge tomorrow  -Lovenox for DVT prophylaxis.    Elevated creatinine and mild hyperkalemia. -Potassium discontinued from IVs -Continue IV hydration -Check labs tomorrow     @  PROBHOSP@  LOS: 3 days    [name] 12/06/2017  . .prob

## 2017-12-06 NOTE — Progress Notes (Signed)
K+ 5.3 today from morning labs.  Nurse notified MD Barry Dienes.  Changed IV fluids from D5 1/2 NS +20K at 139ml/hr to D5 1/2NS at 75 ml/hr per MD [facility] call back order.

## 2017-12-07 LAB — CBC
HEMATOCRIT: 29.2 % — AB (ref 36.0–46.0)
Hemoglobin: 8.9 g/dL — ABNORMAL LOW (ref 12.0–15.0)
MCH: 29.9 pg (ref 26.0–34.0)
MCHC: 30.5 g/dL (ref 30.0–36.0)
MCV: 98 fL (ref 78.0–100.0)
PLATELETS: 227 10*3/uL (ref 150–400)
RBC: 2.98 MIL/uL — ABNORMAL LOW (ref 3.87–5.11)
RDW: 14.6 % (ref 11.5–15.5)
WBC: 7.4 10*3/uL (ref 4.0–10.5)

## 2017-12-07 LAB — URINALYSIS, COMPLETE (UACMP) WITH MICROSCOPIC
BILIRUBIN URINE: NEGATIVE
GLUCOSE, UA: NEGATIVE mg/dL
HGB URINE DIPSTICK: NEGATIVE
KETONES UR: NEGATIVE mg/dL
Nitrite: NEGATIVE
PH: 5 (ref 5.0–8.0)
Protein, ur: NEGATIVE mg/dL
Specific Gravity, Urine: 1.019 (ref 1.005–1.030)

## 2017-12-07 LAB — COMPREHENSIVE METABOLIC PANEL
ALBUMIN: 2.8 g/dL — AB (ref 3.5–5.0)
ALT: 43 U/L (ref 14–54)
ANION GAP: 10 (ref 5–15)
AST: 52 U/L — AB (ref 15–41)
Alkaline Phosphatase: 64 U/L (ref 38–126)
BUN: 14 mg/dL (ref 6–20)
CHLORIDE: 102 mmol/L (ref 101–111)
CO2: 26 mmol/L (ref 22–32)
Calcium: 8.7 mg/dL — ABNORMAL LOW (ref 8.9–10.3)
Creatinine, Ser: 0.85 mg/dL (ref 0.44–1.00)
GFR calc Af Amer: >60 mL/min (ref >60)
Glucose, Bld: 111 mg/dL — ABNORMAL HIGH (ref 65–99)
POTASSIUM: 4.2 mmol/L (ref 3.5–5.1)
Sodium: 138 mmol/L (ref 135–145)
Total Bilirubin: 0.9 mg/dL (ref 0.3–1.2)
Total Protein: 5.8 g/dL — ABNORMAL LOW (ref 6.5–8.1)

## 2017-12-07 LAB — GLUCOSE, CAPILLARY: GLUCOSE-CAPILLARY: 138 mg/dL — AB (ref 65–99)

## 2017-12-07 NOTE — Progress Notes (Addendum)
4 Days Post-Op  Subjective: Had stool.  Tolerating diet.  Cr yesterday had bumped, so IVF upped.  Also had temp last night.    Objective: Vital signs in last 24 hours: Temp:  [98.2 F (36.8 C)-100.5 F (38.1 C)] 98.2 F (36.8 C) (02/04 0424) Pulse Rate:  [88-105] 88 (02/04 0424) Resp:  [16] 16 (02/04 0424) BP: (91-145)/(62-95) 129/79 (02/04 0424) SpO2:  [90 %-95 %] 90 % (02/04 0424) Last BM Date: 12/06/17  Intake/Output from previous day: 02/03 0701 - 02/04 0700 In: 3495 [P.O.:830; I.V.:2665] Out: 1500 [Urine:1500] Intake/Output this shift: No intake/output data recorded.   PE General appearance: alert.  No distress.  Slightly deconditioned Resp: breathing comfortably GI: soft.  Nondistended.  Incision clean.  Drainage is serosanguineous.  Extremities: no edema cyanosis, or tenderness.     Lab Results:  Results for orders placed or performed during the hospital encounter of 12/03/17 (from the past 24 hour(s))  CBC     Status: Abnormal   Collection Time: 12/07/17  6:12 AM  Result Value Ref Range   WBC 7.4 4.0 - 10.5 K/uL   RBC 2.98 (L) 3.87 - 5.11 MIL/uL   Hemoglobin 8.9 (L) 12.0 - 15.0 g/dL   HCT 29.2 (L) 36.0 - 46.0 %   MCV 98.0 78.0 - 100.0 fL   MCH 29.9 26.0 - 34.0 pg   MCHC 30.5 30.0 - 36.0 g/dL   RDW 14.6 11.5 - 15.5 %   Platelets 227 150 - 400 K/uL  Comprehensive metabolic panel     Status: Abnormal   Collection Time: 12/07/17  6:12 AM  Result Value Ref Range   Sodium 138 135 - 145 mmol/L   Potassium 4.2 3.5 - 5.1 mmol/L   Chloride 102 101 - 111 mmol/L   CO2 26 22 - 32 mmol/L   Glucose, Bld 111 (H) 65 - 99 mg/dL   BUN 14 6 - 20 mg/dL   Creatinine, Ser 0.85 0.44 - 1.00 mg/dL   Calcium 8.7 (L) 8.9 - 10.3 mg/dL   Total Protein 5.8 (L) 6.5 - 8.1 g/dL   Albumin 2.8 (L) 3.5 - 5.0 g/dL   AST 52 (H) 15 - 41 U/L   ALT 43 14 - 54 U/L   Alkaline Phosphatase 64 38 - 126 U/L   Total Bilirubin 0.9 0.3 - 1.2 mg/dL   GFR calc non Af Amer >60 >60 mL/min   GFR calc  Af Amer >60 >60 mL/min   Anion gap 10 5 - 15     Studies/Results: No results found.  Marland Kitchen amLODipine  2.5 mg Oral QPM  . busPIRone  15 mg Oral BID  . carvedilol  3.125 mg Oral Daily  . enoxaparin (LOVENOX) injection  40 mg Subcutaneous Q24H  . escitalopram  20 mg Oral Daily  . gabapentin  300 mg Oral BID  . zolpidem  5 mg Oral QHS     Assessment/Plan: s/p Procedure(s): HAND ASSISTED LAPAROSCOPIC PARTIAL HEPATECTOMY LAPAROSCOPIC VENTRAL HERNIA REPAIR  POD#4 - hand-assisted laparoscopic resection, left lateral segment of liver. -Stable Will hold on d/c today given fever.  Also, will want to saline lock IV and make sure her cr does not bump again.  Will send u/a.  Hope for d/c tomorrow.    -Lovenox for DVT prophylaxis.    Elevated creatinine secondary to acute kidney injury and mild hyperkalemia. -improved.     LOS: 4 days    Stark Klein 12/07/2017

## 2017-12-07 NOTE — Care Management Important Message (Signed)
Important Message  Patient Details  Name: Janet Hanson MRN: [id number] Date of Birth: 1946-09-18   Medicare Important Message Given:  Yes    Barb Merino Claudina Oliphant 12/07/2017, 3:57 PM

## 2017-12-08 LAB — CBC
HCT: 27.9 % — ABNORMAL LOW (ref 36.0–46.0)
HEMOGLOBIN: 8.5 g/dL — AB (ref 12.0–15.0)
MCH: 29.5 pg (ref 26.0–34.0)
MCHC: 30.5 g/dL (ref 30.0–36.0)
MCV: 96.9 fL (ref 78.0–100.0)
PLATELETS: 247 10*3/uL (ref 150–400)
RBC: 2.88 MIL/uL — ABNORMAL LOW (ref 3.87–5.11)
RDW: 14.3 % (ref 11.5–15.5)
WBC: 7.6 10*3/uL (ref 4.0–10.5)

## 2017-12-08 LAB — BASIC METABOLIC PANEL
Anion gap: 12 (ref 5–15)
BUN: 11 mg/dL (ref 6–20)
CALCIUM: 8.7 mg/dL — AB (ref 8.9–10.3)
CHLORIDE: 101 mmol/L (ref 101–111)
CO2: 26 mmol/L (ref 22–32)
CREATININE: 0.69 mg/dL (ref 0.44–1.00)
Glucose, Bld: 96 mg/dL (ref 65–99)
Potassium: 4 mmol/L (ref 3.5–5.1)
SODIUM: 139 mmol/L (ref 135–145)

## 2017-12-08 MED ORDER — HYDROCODONE-ACETAMINOPHEN 5-325 MG PO TABS: 1.0000 | ORAL_TABLET | Freq: Four times a day (QID) | ORAL | 0 refills | Status: AC | PRN

## 2017-12-08 MED ORDER — GABAPENTIN 300 MG PO CAPS: ORAL_CAPSULE | ORAL | 1 refills | Status: DC

## 2017-12-08 NOTE — Discharge Summary (Signed)
Physician Discharge Summary  Patient ID: Janet Hanson MRN: [id number] DOB/AGE: 03/01/1946 72 y.o.  Admit date: 12/03/2017 Discharge date: 12/08/2017  Admission Diagnoses: Patient Active Problem List   Diagnosis Date Noted  . Post-op pain 12/03/2017  . Colon cancer metastasized to liver ([place]) 12/03/2017  . Vitamin B12 deficiency 08/13/2016  . Elevated CEA 07/31/2016  . Cancer of right colon ([place]) 07/30/2016  . Iron deficiency anemia due to chronic blood loss 07/30/2016  . Acute pulmonary embolism ([place]) 07/01/2016  . Colonic mass 06/24/2016  . Anxiety 06/24/2016  . HTN (hypertension) 06/24/2016    Discharge Diagnoses:  Active Problems:   Post-op pain   Colon cancer metastasized to liver [facility]) and same as above.   Acute blood loss anemia - did not require treatment Acute kidney injury, resolved Hyperkalemia- resolved.  Discharged Condition: stable  Hospital Course:  Pt was admitted to the floor following a hand assisted lap left lateral segmentectomy.  She had an expected bump in LFTs on POD 1 that came back down.  She had a mild expected post op ileus.  Drain remained serosanguinous.  Her creatinine bumped and her K went up as well.  Her IV fluids were changed, and her diet was also able to be advanced that day.  She had return of bowel function.  She was ambulatory.  She was able to void spontaneously after foley removal.  Drain was removed prior to discharge.     Consults: None  Significant Diagnostic Studies: labs: Normal electrolytes except for slightly low Ca.  AST sl elevated at 52.  HCT prior to d/c 27.9.    Treatments: surgery: see above   Discharge Exam: Blood pressure 140/75, pulse 94, temperature 98.7 F (37.1 C), temperature source Oral, resp. rate 16, height 5\' 2"  (1.575 m), weight 74 kg (163 lb 2 oz), SpO2 93 %. General appearance: alert, cooperative and no distress Resp: breathing comfortably GI: soft, non distended, approp tender.  no erythema at  incision Extremities: extremities normal, atraumatic, no cyanosis or edema  Disposition: 01-Home or Self Care  Discharge Instructions    Call MD for:  persistant dizziness or light-headedness   Complete by:  As directed    Call MD for:  persistant nausea and vomiting   Complete by:  As directed    Call MD for:  redness, tenderness, or signs of infection (pain, swelling, redness, odor or green/yellow discharge around incision site)   Complete by:  As directed    Call MD for:  severe uncontrolled pain   Complete by:  As directed    Call MD for:  temperature >100.4   Complete by:  As directed    Diet - low sodium heart healthy   Complete by:  As directed    Increase activity slowly   Complete by:  As directed      Allergies as of 12/08/2017   No Known Allergies     Medication List    TAKE these medications   acetaminophen 500 MG tablet Commonly known as:  TYLENOL Take 500 mg by mouth every 6 (six) hours as needed (for pain.).   amLODipine 5 MG tablet Commonly known as:  NORVASC Take 5 mg by mouth every evening.   busPIRone 15 MG tablet Commonly known as:  BUSPAR Take 15 mg by mouth 2 (two) times daily.   carvedilol 3.125 MG tablet Commonly known as:  COREG Take 3.125 mg by mouth daily.   dexamethasone 4 MG tablet Commonly known as:  DECADRON Take 2 tablets (8 mg total) by mouth daily. Start the day after chemotherapy for 2 days. Take with food.   escitalopram 20 MG tablet Commonly known as:  LEXAPRO Take 20 mg by mouth daily.   Fish Oil 1200 MG Caps Take 1,200 mg by mouth daily.   gabapentin 300 MG capsule Commonly known as:  NEURONTIN Take 300 mg in AM and at lunch.  Take 600 mg at bedtime.   HYDROcodone-acetaminophen 5-325 MG tablet Commonly known as:  NORCO Take 1 tablet by mouth every 6 (six) hours as needed for moderate pain.   lidocaine-prilocaine cream Commonly known as:  EMLA Apply to affected area once   loperamide 2 MG capsule Commonly known  as:  IMODIUM Take 2-4 mg by mouth 4 (four) times daily as needed for diarrhea or loose stools.   meclizine 32 MG tablet Commonly known as:  ANTIVERT Take 1 tablet (32 mg total) by mouth 3 (three) times daily as needed.   metFORMIN 500 MG tablet Commonly known as:  GLUCOPHAGE Take 500 mg by mouth daily with breakfast.   ondansetron 8 MG tablet Commonly known as:  ZOFRAN Take 1 tablet (8 mg total) by mouth 2 (two) times daily as needed for refractory nausea / vomiting. Start on day 3 after chemotherapy.   prochlorperazine 10 MG tablet Commonly known as:  COMPAZINE Take 1 tablet (10 mg total) by mouth every 6 (six) hours as needed (Nausea or vomiting).   simvastatin 40 MG tablet Commonly known as:  ZOCOR Take 40 mg by mouth daily.   vitamin B-12 1000 MCG tablet Commonly known as:  CYANOCOBALAMIN Take 1,000 mcg by mouth daily.   vitamin C 500 MG tablet Commonly known as:  ASCORBIC ACID Take 500 mg by mouth daily.   warfarin 5 MG tablet Commonly known as:  COUMADIN Take 5-7.5 mg by mouth See admin instructions. Take 1 tablet (5 mg) by mouth every day, except take 1.5 tablets (7.5 mg) by mouth on Tuesdays and Fridays.   zolpidem 5 MG tablet Commonly known as:  AMBIEN Take 10 mg by mouth at bedtime.      Follow-up Information    Stark Klein, MD Follow up in 2 week(s).   Specialty:  General Surgery Contact information: [address] [place] [address] [phone]           Signed: Stark Klein 12/08/2017, 8:42 AM

## 2017-12-08 NOTE — Discharge Instructions (Signed)
[facility]      [facility] °[phone] ° °ABDOMINAL SURGERY: POST OP INSTRUCTIONS ° °Always review your discharge instruction sheet given to you by the facility where your surgery was performed. ° °IF YOU HAVE DISABILITY OR FAMILY LEAVE FORMS, YOU MUST BRING THEM TO THE OFFICE FOR PROCESSING.  PLEASE DO NOT GIVE THEM TO YOUR DOCTOR. ° °1. A prescription for pain medication may be given to you upon discharge.  Take your pain medication as prescribed, if needed.  If narcotic pain medicine is not needed, then you may take acetaminophen (Tylenol) or ibuprofen (Advil) as needed. °2. Take your usually prescribed medications unless otherwise directed. °3. If you need a refill on your pain medication, please contact your pharmacy. They will contact our office to request authorization.  Prescriptions will not be filled after 5pm or on week-ends. °4. You should follow a light diet the first few days after arrival home, such as soup and crackers, pudding, etc.unless your doctor has advised otherwise. A high-fiber, low fat diet can be resumed as tolerated.   Be sure to include lots of fluids daily. Most patients will experience some swelling and bruising on the chest and neck area.  Ice packs will help.  Swelling and bruising can take several days to resolve °5. Most patients will experience some swelling and bruising in the area of the incision. Ice pack will help. Swelling and bruising can take several days to resolve..  °6. It is common to experience some constipation if taking pain medication after surgery.  Increasing fluid intake and taking a stool softener will usually help or prevent this problem from occurring.  A mild laxative (Milk of Magnesia or Miralax) should be taken according to package directions if there are no bowel movements after 48 hours. °7.  You may have steri-strips (small skin tapes) in place directly over the incision.  These strips should be left on the skin for 10-14 days.  If your  surgeon used skin glue on the incision, you may shower in 48 hours.  The glue will flake off over the next 2-3 weeks.  Any sutures or staples will be removed at the office during your follow-up visit. You may find that a light gauze bandage over your incision may keep your staples from being rubbed or pulled. You may shower and replace the bandage daily. °8. ACTIVITIES:  You may resume regular (light) daily activities beginning the next day--such as daily self-care, walking, climbing stairs--gradually increasing activities as tolerated.  You may have sexual intercourse when it is comfortable.  Refrain from any heavy lifting or straining until approved by your doctor. °a. You may drive when you no longer are taking prescription pain medication, you can comfortably wear a seatbelt, and you can safely maneuver your car and apply brakes °b. Return to Work: __________8 weeks if applicable_________________________ °9. You should see your doctor in the office for a follow-up appointment approximately two weeks after your surgery.  Make sure that you call for this appointment within a day or two after you arrive home to insure a convenient appointment time. °OTHER INSTRUCTIONS:  °_____________________________________________________________ °_____________________________________________________________ ° °WHEN TO CALL YOUR DOCTOR: °1. Fever over 101.0 °2. Inability to urinate °3. Nausea and/or vomiting °4. Extreme swelling or bruising °5. Continued bleeding from incision. °6. Increased pain, redness, or drainage from the incision. °7. Difficulty swallowing or breathing °8. Muscle cramping or spasms. °9. Numbness or tingling in hands or feet or around lips. ° °The clinic staff is   available to answer your questions during regular business hours.  Please don’t hesitate to call and ask to speak to one of the nurses if you have concerns. ° °For further questions, please visit [email or web address] ° ° ° °

## 2017-12-10 LAB — TYPE AND SCREEN
ABO/RH(D): A POS
Antibody Screen: NEGATIVE
UNIT DIVISION: 0
Unit division: 0
Unit division: 0
Unit division: 0

## 2017-12-10 LAB — BPAM RBC
BLOOD PRODUCT EXPIRATION DATE: 201902041752
Blood Product Expiration Date: 201902142359
Blood Product Expiration Date: 201902152359
Blood Product Expiration Date: 201902152359
ISSUE DATE / TIME: 201902021338
ISSUE DATE / TIME: 201902041549
ISSUE DATE / TIME: 201902041752
UNIT TYPE AND RH: 6200
UNIT TYPE AND RH: 6200
Unit Type and Rh: 6200
Unit Type and Rh: 6200

## 2017-12-16 ENCOUNTER — Telehealth (HOSPITAL_COMMUNITY): Payer: Self-pay | Admitting: Emergency Medicine

## 2017-12-16 ENCOUNTER — Encounter (HOSPITAL_COMMUNITY): Payer: Self-pay | Admitting: Adult Health

## 2017-12-16 NOTE — Telephone Encounter (Signed)
Called pt to let her know that we were going to cancel her CT scans that were scheduled for Feb per Dr Barry Dienes and Mike Craze Np discussion.  She will still see the doctor in March.  At that time Dr Walden Field can order scans.  RN moved the lab appt to the same day as the office visit per request of the patient.  Labs 01/05/2018 at 2:10 pm and OV at 2:40 pm.  Pt verbalized understanding.

## 2017-12-16 NOTE — Progress Notes (Signed)
Pt was originally scheduled for routine restaging scans on 12/30/17. However, she recently had surgery with Dr. Barry Dienes on 12/03/17.  I spoke with Dr. Barry Dienes via [facility] message in Epic to see if scans would be warranted this soon after surgery.   Dr. Barry Dienes recommended we hold off on scans until at least April, unless her CEA post-operatively remains elevated.  Will keep her follow-up visit that is scheduled with Dr. Walden Field here at [facility] on 01/05/18, but will cancel the scans for now.  We can reschedule scans in the future as deemed clinically appropriate by Dr. Walden Field after she sees the patient at follow-up.    Mike Craze, NP [place] [phone]

## 2017-12-29 ENCOUNTER — Other Ambulatory Visit (HOSPITAL_COMMUNITY): Payer: Medicare Other

## 2017-12-30 ENCOUNTER — Ambulatory Visit (HOSPITAL_COMMUNITY): Payer: Medicare Other

## 2018-01-05 ENCOUNTER — Inpatient Hospital Stay (HOSPITAL_COMMUNITY): Payer: Medicare Other | Attending: Internal Medicine | Admitting: Internal Medicine

## 2018-01-05 ENCOUNTER — Inpatient Hospital Stay (HOSPITAL_COMMUNITY): Payer: Medicare Other

## 2018-01-05 ENCOUNTER — Encounter (HOSPITAL_COMMUNITY): Payer: Self-pay | Admitting: Internal Medicine

## 2018-01-05 VITALS — BP 125/72 | HR 85 | Temp 98.4°F | Resp 18 | Wt 165.1 lb

## 2018-01-05 DIAGNOSIS — C182 Malignant neoplasm of ascending colon: Secondary | ICD-10-CM

## 2018-01-05 DIAGNOSIS — Z87891 Personal history of nicotine dependence: Secondary | ICD-10-CM | POA: Diagnosis not present

## 2018-01-05 DIAGNOSIS — Z79899 Other long term (current) drug therapy: Secondary | ICD-10-CM | POA: Insufficient documentation

## 2018-01-05 DIAGNOSIS — I10 Essential (primary) hypertension: Secondary | ICD-10-CM | POA: Insufficient documentation

## 2018-01-05 DIAGNOSIS — E119 Type 2 diabetes mellitus without complications: Secondary | ICD-10-CM | POA: Diagnosis not present

## 2018-01-05 DIAGNOSIS — Z9221 Personal history of antineoplastic chemotherapy: Secondary | ICD-10-CM | POA: Diagnosis not present

## 2018-01-05 DIAGNOSIS — Z7901 Long term (current) use of anticoagulants: Secondary | ICD-10-CM | POA: Insufficient documentation

## 2018-01-05 DIAGNOSIS — C787 Secondary malignant neoplasm of liver and intrahepatic bile duct: Secondary | ICD-10-CM | POA: Diagnosis not present

## 2018-01-05 DIAGNOSIS — C189 Malignant neoplasm of colon, unspecified: Secondary | ICD-10-CM

## 2018-01-05 DIAGNOSIS — Z7984 Long term (current) use of oral hypoglycemic drugs: Secondary | ICD-10-CM | POA: Diagnosis not present

## 2018-01-05 LAB — CBC WITH DIFFERENTIAL/PLATELET
Basophils Absolute: 0 10*3/uL (ref 0.0–0.1)
Basophils Relative: 0 %
EOS PCT: 3 %
Eosinophils Absolute: 0.2 10*3/uL (ref 0.0–0.7)
HCT: 37.1 % (ref 36.0–46.0)
Hemoglobin: 11.2 g/dL — ABNORMAL LOW (ref 12.0–15.0)
LYMPHS ABS: 1.9 10*3/uL (ref 0.7–4.0)
LYMPHS PCT: 27 %
MCH: 28.9 pg (ref 26.0–34.0)
MCHC: 30.2 g/dL (ref 30.0–36.0)
MCV: 95.6 fL (ref 78.0–100.0)
MONO ABS: 0.5 10*3/uL (ref 0.1–1.0)
MONOS PCT: 8 %
Neutro Abs: 4.3 10*3/uL (ref 1.7–7.7)
Neutrophils Relative %: 62 %
Platelets: 189 10*3/uL (ref 150–400)
RBC: 3.88 MIL/uL (ref 3.87–5.11)
RDW: 14.7 % (ref 11.5–15.5)
WBC: 6.9 10*3/uL (ref 4.0–10.5)

## 2018-01-05 LAB — COMPREHENSIVE METABOLIC PANEL
ALT: 21 U/L (ref 14–54)
ANION GAP: 13 (ref 5–15)
AST: 30 U/L (ref 15–41)
Albumin: 3.8 g/dL (ref 3.5–5.0)
Alkaline Phosphatase: 64 U/L (ref 38–126)
BUN: 13 mg/dL (ref 6–20)
CO2: 22 mmol/L (ref 22–32)
CREATININE: 0.74 mg/dL (ref 0.44–1.00)
Calcium: 9 mg/dL (ref 8.9–10.3)
Chloride: 101 mmol/L (ref 101–111)
Glucose, Bld: 111 mg/dL — ABNORMAL HIGH (ref 65–99)
Potassium: 4.8 mmol/L (ref 3.5–5.1)
Sodium: 136 mmol/L (ref 135–145)
Total Bilirubin: 0.6 mg/dL (ref 0.3–1.2)
Total Protein: 7.2 g/dL (ref 6.5–8.1)

## 2018-01-05 NOTE — Patient Instructions (Addendum)
[place] at [facility] Discharge Instructions   You were seen today by Dr. Zoila Shutter. Per Dr. Marlowe Aschoff request your CT scan will be done in April. We will get that scheduled. Overtime the numbness in your feet should get better. If you don't see improvement with the gabapentin by April, we may make a change in the medication that you are taking. Continue taking the warfarin as prescribed.  Return in mid April for follow up and results review.    Thank you for choosing [place] at [facility] to provide your oncology and hematology care.  To afford each patient quality time with our provider, please arrive at least 15 minutes before your scheduled appointment time.    If you have a lab appointment with the [facility] please come in thru the  Main Entrance and check in at the main information desk  You need to re-schedule your appointment should you arrive 10 or more minutes late.  We strive to give you quality time with our providers, and arriving late affects you and other patients whose appointments are after yours.  Also, if you no show three or more times for appointments you may be dismissed from the clinic at the providers discretion.     Again, thank you for choosing [facility].  Our hope is that these requests will decrease the amount of time that you wait before being seen by our physicians.       _____________________________________________________________  Should you have questions after your visit to [facility], please contact our office at [phone] between the hours of 8:30 a.m. and 4:30 p.m.  Voicemails left after 4:30 p.m. will not be returned until the following business day.  For prescription refill requests, have your pharmacy contact our office.       Resources For Cancer Patients and their Caregivers ? American Cancer Society: Can assist with transportation, wigs, general needs,  runs Look Good Feel Better.        848 200 1474 ? Cancer Care: Provides financial assistance, online support groups, medication/co-pay assistance.  1-800-813-HOPE 351-864-8180) ? [facility] Assists [place] cancer patients and their families through emotional , educational and financial support.  936-508-6967 ? [place] DSS Where to apply for food stamps, Medicaid and utility assistance. (470) 017-3328 ? [facility]: Transportation to medical appointments. (705)354-2297 ? Social Security Administration: May apply for disability if have a Stage IV cancer. 647-180-1375 512 596 0970 ? [employer], Disability and Transit Services: Assists with nutrition, care and transit needs. [place] Support Programs:   > Cancer Support Group  2nd Tuesday of the month 1pm-2pm, Journey Room   > Creative Journey  3rd Tuesday of the month 1130am-1pm, Journey Room

## 2018-01-06 LAB — CEA: CEA1: 3.8 ng/mL (ref 0.0–4.7)

## 2018-02-01 NOTE — Progress Notes (Signed)
Diagnosis Colon cancer metastasized to liver [facility]) - Plan: CT Abdomen Pelvis W Contrast, CT Chest W Contrast  Staging Cancer Staging Cancer of right colon [facility]) Staging form: Colon and Rectum, AJCC 7th Edition - Pathologic stage from 07/02/2016: Stage IVB (T3, N0, M1b) - Signed by Twana First, MD on 02/10/2017   Assessment and Plan:  1.  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 Talbert Cage.  Pt was diagnosed in 06/2016. CEA elevated at 39.3 at time of diagnosis. Underwent partial colectomy. She elected did not receive adjuvant chemo given possible risks and likely small benefit. Repeat CT imaging in 12/2016 revealed left liver lesion and soft tissue lesion in the cul-de-sac, both concerning for metastatic disease. Liver lesion biopsied by IR and positive for metastatic disease.  Started FOLFOX chemotherapy on 02/10/17; Avastin added with cycle #3.  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  She completed cycle 12 of FOLFOX and Avastin on 08/11/2017.  Pt has been seen by Dr. Barry Dienes due to 1.2 cm hepatic lesion.  She has undergone resection on 12/03/2017 with pathology returning as metastatic adenocarcinoma measuring 3.2 cm.  Margins are negative.   She is recommended for repeat imaging in 02/2018.  Pt will RTC at that time to go over scans and to discuss options of maintenance therapy.  She will have repeat labs with CEA, CBC, CMP.    2. Hypertension.  BP is 125/72.  Continue to follow-up with PCP.    Current Status:  Pt is seen today for follow-up.  She continues to recover from surgery.  She is here to go over recent pathology.      Cancer of right colon ([place])   06/24/2016 Imaging    CT C/A/P There is a large annular constricting mass involving the mid aspect of the ascending colon most compatible with colonic carcinoma. There is a small adjacent foci of gas and free fluid raising the possibility of micro  perforation. There is marked dilatation of the cecum and small bowel with fluid and gas most compatible with proximal obstruction caused by the large ascending colonic mass. Additionally there is a 6 cm fatty mass involving the colon at the level the hepatic flexure, most suggestive of a lipoma. Aortic vascular calcifications. Sigmoid colonic diverticulosis without evidence for acute diverticulitis.No specific findings identified to suggest metastatic disease to the chest.There is a 3 mm nodule identified within the left upper lobe, nonspecific.Bilateral pleural effusions with overlying compressive type atelectasis and consolidation       06/26/2016 Tumor Marker    CEA 39.3 ng/ml      06/27/2016 Surgery    Partial colectomy with primary linear stapled anastomosis.  Large bulky mid-ascending colonic mass extending into the colonic mesentery and retroperitoneum with a layer of not visibly/palpably involved fat between the colon and underlying visualized and exposed duodenum and Right kidney      06/27/2016 Pathology Results    COLONIC ADENOCARCINOMA (8.5 CM), GRADE 2 THE TUMOR INVADES THROUGH THE MUSCULARIS PROPRIA INTO PERICOLONIC TISSUE  ALL MARGINS OF RESECTION ARE NEGATIVE FOR CARCINOMA FOURTEEN BENIGN LYMPH NODES (0/14) TUBULAR ADENOMA (X3) UNREMARKABLE APPENDIX      06/30/2016 Imaging    CT angio chest Positive examination for right upper lobe and right lower lobe pulmonary emboli. No evidence of right heart strain. Again demonstrated are bilateral pleural effusions and basilar atelectasis, greater on the right.      07/30/2016 Tumor Marker  Ref. Range 07/30/2016 16:46  CEA Latest Ref Range: 0.0 - 4.7 ng/mL 4.0        07/30/2016 Miscellaneous    Medical Oncology consultation.  No adjuvant chemotherapy needed based upon risk factors and small benefit.  Informed decision made by patient to not pursue systemic chemotherapy in adjuvant setting.      11/05/2016 Procedure     Colonoscopy by Dr. Laural Golden.  soft anal tags found on digital rectal exam. - Patent end-to-side colo-colonic anastomosis, characterized by healthy appearing mucosa. - Three 5 to 7 mm polyps in the proximal transverse colon, removed with a cold snare. Resected and retrieved. - One 10 mm polyp in the proximal transverse colon, removed with a hot snare. Resected and retrieved. - One 20 mm polyp in the proximal sigmoid colon, removed with a hot snare. Resected and retrieved. Clip (MR conditional) was placed. - One 15 to 25 mm polyp at the recto-sigmoid colon, removed with a hot snare. Resected and retrieved. Clip (MR conditional) was placed. - Diverticulosis in the sigmoid colon and in the descending colon. - The distal rectum and anal verge are normal on retroflexion view.      11/07/2016 Pathology Results    1. Colon, polyp(s), transverse proximal - TUBULAR ADENOMA(S). - HIGH GRADE DYSPLASIA IS NOT IDENTIFIED. 2. Colon, polyp(s), proximal sigmoid - TUBULOVILLOUS ADENOMA WITH FOCAL HIGH GRADE DYSPLASIA (5%). 3. Rectosigmoid , polyp - TUBULOVILLOUS ADENOMA WITH HIGH GRADE DYSPLASIA (15%).      12/26/2016 Imaging    CT abd/pelvis-1. Interval development of a 2.4 cm rim enhancing lesion in the lateral segment left liver, highly suspicious for metastatic disease. 2. New abnormal irregular soft tissue in the cul-de-sac, involving the anterior wall of the rectum and posterior cervix. This is highly suspicious for metastatic involvement (drop lesion). 3. Interval Right hemicolectomy. 4.  Abdominal Aortic Atherosclerois (ICD10-170.0)      01/16/2017 Procedure    IR biopsy of hepatic lesion      01/19/2017 Pathology Results    Liver, needle/core biopsy, Left Lobe - ADENOCARCINOMA Microscopic Comment The morphology is consistent with metastatic colorectal adenocarcinoma. There is sufficient tissue for additional studies. Called to Kirby Crigler on 01/19/17      02/06/2017 Imaging    CT  chest- 1. Stable CT chest. No substantial change in the 7 mm short axis left paraesophageal lymph node. 2. Rim enhancing lesion lateral segment left liver has progressed in the interval      02/09/2017 Procedure    Port-A-Cath insertion by Dr. Arnoldo Morale      02/10/2017 -  Chemotherapy    The patient had palonosetron (ALOXI) injection 0.25 mg, 0.25 mg, Intravenous,  Once, 1 of 4 cycles  leucovorin 800 mg in dextrose 5 % 250 mL infusion, 404 mg/m2 = 792 mg, Intravenous,  Once, 1 of 4 cycles  oxaliplatin (ELOXATIN) 170 mg in dextrose 5 % 500 mL chemo infusion, 85 mg/m2 = 170 mg, Intravenous,  Once, 1 of 4 cycles  fluorouracil (ADRUCIL) chemo injection 800 mg, 400 mg/m2 = 800 mg, Intravenous,  Once, 1 of 4 cycles  fluorouracil (ADRUCIL) 4,750 mg in sodium chloride 0.9 % 55 mL chemo infusion, 2,400 mg/m2 = 4,750 mg, Intravenous, 1 Day/Dose, 1 of 4 cycles  for chemotherapy treatment.        03/11/2017 Pathology Results    FoundationONE- KRAS wildtype, NRAS wildtype, MS stable, TMB 6 Muts/Mb, APC F638*, APC G665*, TERT promoter-124C>T, TP53 LOSS.  Therapies with clinical benefit include cetuximab, Panitumumab.  05/01/2017 Imaging    CT C/A/P: 1. The left hepatic metastasis is smaller in the interval. The soft tissue between the anterior wall of the rectum and the posterior uterus is concerning for a drop metastasis, similar in the interval. No other metastatic disease is identified. 2. The ascending thoracic aorta measures 4.1 cm. Ascending thoracic aortic aneurysm. Recommend semi-annual imaging followup by CTA or MRA and referral to cardiothoracic surgery if not already obtained. This recommendation follows 2010 ACCF/AHA/AATS/ACR/ASA/SCA/SCAI/SIR/STS/SVM Guidelines for the Diagnosis and Management of Patients With Thoracic Aortic Disease. Circulation. 2010; 121: W098-J191 3. Atherosclerosis in the aorta. 4. Coronary artery calcifications.      09/04/2017 Imaging    CT C/A/P: Mild  decreased size of small left hepatic lobe metastasis since previous study. No other sites of metastatic disease identified.  New benign appearing L1, L3, and L5 vertebral body compression fractures since 05/01/2017 exam.  Colonic diverticulosis, without radiographic evidence of diverticulitis.        Problem List Patient Active Problem List   Diagnosis Date Noted  . Post-op pain [G89.18] 12/03/2017  . Colon cancer metastasized to liver ([place]) [C18.9, C78.7] 12/03/2017  . Vitamin B12 deficiency [E53.8] 08/13/2016  . Elevated CEA [R97.0] 07/31/2016  . Cancer of right colon ([place]) [C18.2] 07/30/2016  . Iron deficiency anemia due to chronic blood loss [D50.0] 07/30/2016  . Acute pulmonary embolism ([place]) [I26.99] 07/01/2016  . Colonic mass [K63.9] 06/24/2016  . Anxiety [F41.9] 06/24/2016  . HTN (hypertension) [I10] 06/24/2016    Past Medical History Past Medical History:  Diagnosis Date  . Anxiety   . Arthritis   . Cancer of right colon ([place]) 07/30/2016  . Colon cancer ([place])   . COPD (chronic obstructive pulmonary disease) ([place])    just quite smoking 2015  . Depression   . Diabetes mellitus without complication (HCC)    was at one time, she has since lost weight.  . FH: cataracts    left eye  . Hypercholesterolemia   . Hypertension   . Iron deficiency anemia due to chronic blood loss 07/30/2016  . Neuropathy    possibly from chemo (all her extrem, tho her fingers are better)  . PE (pulmonary thromboembolism) ([place])    x 2 in 2017  . Vitamin B12 deficiency 08/13/2016    Past Surgical History Past Surgical History:  Procedure Laterality Date  . COLON SURGERY    . COLONOSCOPY N/A 11/05/2016   Procedure: COLONOSCOPY;  Surgeon: Rogene Houston, MD;  [facility];  Service: Endoscopy;  Laterality: N/A;  1:20  . HERNIA REPAIR    . LAPAROSCOPIC INCISIONAL / UMBILICAL / VENTRAL HERNIA REPAIR  12/03/2017   VHR  . LAPAROSCOPIC PARTIAL HEPATECTOMY  12/03/2017   HAND  ASSISTED LAPAROSCOPIC PARTIAL HEPATECTOMY  . LAPAROSCOPIC PARTIAL HEPATECTOMY N/A 12/03/2017   Procedure: HAND ASSISTED LAPAROSCOPIC PARTIAL HEPATECTOMY;  Surgeon: Stark Klein, MD;  Location: [facility];  Service: General;  Laterality: N/A;  . PARTIAL COLECTOMY N/A 06/27/2016   Procedure: PARTIAL COLECTOMY;  Surgeon: Vickie Epley, MD;  Location: [facility] ORS;  Service: General;  Laterality: N/A;  . PORTACATH PLACEMENT Right 02/09/2017   Procedure: INSERTION PORT-A-CATH;  Surgeon: Aviva Signs, MD;  Location: [facility] ORS;  Service: General;  Laterality: Right;  right subclavian  . VENTRAL HERNIA REPAIR N/A 12/03/2017   Procedure: LAPAROSCOPIC VENTRAL HERNIA REPAIR;  Surgeon: Stark Klein, MD;  Location: [facility] OR;  Service: General;  Laterality: N/A;    Family History Family History  Problem Relation Age  of Onset  . Hypertension Mother   . Other Father        some type of "stomach ailment"  . Suicidality Brother   . Stomach cancer Maternal Aunt   . Brain cancer Brother      Social History  reports that she quit smoking about 19 months ago. Her smoking use included cigarettes. She has a 24.00 pack-year smoking history. She has never used smokeless tobacco. She reports that she does not drink alcohol or use drugs.  Medications  Current Outpatient Medications:  .  acetaminophen (TYLENOL) 500 MG tablet, Take 500 mg by mouth every 6 (six) hours as needed (for pain.)., Disp: , Rfl:  .  amLODipine (NORVASC) 5 MG tablet, Take 5 mg by mouth every evening. , Disp: , Rfl:  .  busPIRone (BUSPAR) 15 MG tablet, Take 15 mg by mouth 2 (two) times daily. , Disp: , Rfl:  .  carvedilol (COREG) 3.125 MG tablet, Take 3.125 mg by mouth daily. , Disp: , Rfl:  .  dexamethasone (DECADRON) 4 MG tablet, Take 2 tablets (8 mg total) by mouth daily. Start the day after chemotherapy for 2 days. Take with food. (Patient not taking: Reported on 11/23/2017), Disp: 30 tablet, Rfl: 1 .  escitalopram (LEXAPRO) 20 MG tablet, Take 20 mg by  mouth daily. , Disp: , Rfl:  .  gabapentin (NEURONTIN) 300 MG capsule, Take 300 mg in AM and at lunch.  Take 600 mg at bedtime., Disp: 120 capsule, Rfl: 1 .  HYDROcodone-acetaminophen (NORCO) 5-325 MG tablet, Take 1 tablet by mouth every 6 (six) hours as needed for moderate pain., Disp: 15 tablet, Rfl: 0 .  lidocaine-prilocaine (EMLA) cream, Apply to affected area once (Patient not taking: Reported on 11/23/2017), Disp: 30 g, Rfl: 3 .  loperamide (IMODIUM) 2 MG capsule, Take 2-4 mg by mouth 4 (four) times daily as needed for diarrhea or loose stools. , Disp: , Rfl:  .  meclizine (ANTIVERT) 32 MG tablet, Take 1 tablet (32 mg total) by mouth 3 (three) times daily as needed. (Patient not taking: Reported on 11/23/2017), Disp: 30 tablet, Rfl: 1 .  metFORMIN (GLUCOPHAGE) 500 MG tablet, Take 500 mg by mouth daily with breakfast. , Disp: , Rfl:  .  Omega-3 Fatty Acids (FISH OIL) 1200 MG CAPS, Take 1,200 mg by mouth daily. , Disp: , Rfl:  .  ondansetron (ZOFRAN) 8 MG tablet, Take 1 tablet (8 mg total) by mouth 2 (two) times daily as needed for refractory nausea / vomiting. Start on day 3 after chemotherapy. (Patient not taking: Reported on 11/23/2017), Disp: 30 tablet, Rfl: 1 .  prochlorperazine (COMPAZINE) 10 MG tablet, Take 1 tablet (10 mg total) by mouth every 6 (six) hours as needed (Nausea or vomiting). (Patient not taking: Reported on 11/23/2017), Disp: 30 tablet, Rfl: 1 .  simvastatin (ZOCOR) 40 MG tablet, Take 40 mg by mouth daily., Disp: , Rfl:  .  vitamin B-12 (CYANOCOBALAMIN) 1000 MCG tablet, Take 1,000 mcg by mouth daily., Disp: , Rfl:  .  vitamin C (ASCORBIC ACID) 500 MG tablet, Take 500 mg by mouth daily., Disp: , Rfl:  .  warfarin (COUMADIN) 5 MG tablet, Take 5-7.5 mg by mouth See admin instructions. Take 1 tablet (5 mg) by mouth every day, except take 1.5 tablets (7.5 mg) by mouth on Tuesdays and Fridays., Disp: , Rfl:  .  warfarin (COUMADIN) 7.5 MG tablet, , Disp: , Rfl:  .  zolpidem (AMBIEN) 10  MG tablet, , Disp: ,  Rfl:  .  zolpidem (AMBIEN) 5 MG tablet, Take 10 mg by mouth at bedtime., Disp: , Rfl:   Allergies Patient has no known allergies.  Review of Systems Review of Systems - Oncology ROS as per HPI otherwise 12 point ROS is negative.   Physical Exam  Vitals Wt Readings from Last 3 Encounters:  01/05/18 165 lb 1.6 oz (74.9 kg)  12/03/17 163 lb 2 oz (74 kg)  11/30/17 163 lb 2 oz (74 kg)   Temp Readings from Last 3 Encounters:  01/05/18 98.4 F (36.9 C) (Oral)  12/08/17 98.7 F (37.1 C) (Oral)  11/30/17 98.9 F (37.2 C) (Oral)   BP Readings from Last 3 Encounters:  01/05/18 125/72  12/08/17 140/75  11/30/17 132/84   Pulse Readings from Last 3 Encounters:  01/05/18 85  12/08/17 94  11/30/17 (!) 118    Constitutional: Well-developed, well-nourished, and in no distress.   HENT: Head: Normocephalic and atraumatic.  Mouth/Throat: No oropharyngeal exudate. Mucosa moist. Eyes: Pupils are equal, round, and reactive to light. Conjunctivae are normal. No scleral icterus.  Neck: Normal range of motion. Neck supple. No JVD present.  Cardiovascular: Normal rate, regular rhythm and normal heart sounds.  Exam reveals no gallop and no friction rub.   No murmur heard. Pulmonary/Chest: Effort normal and breath sounds normal. No respiratory distress. No wheezes.No rales.  Abdominal: Soft. Bowel sounds are normal. No distension. There is no tenderness. There is no guarding. Incision site healing.   Musculoskeletal: No edema or tenderness.  Lymphadenopathy: No cervical, axillary or supraclavicular adenopathy.  Neurological: Alert and oriented to person, place, and time. No cranial nerve deficit.  Skin: Skin is warm and dry. No rash noted. No erythema. No pallor.  Psychiatric: Affect and judgment normal.   Labs Appointment on 01/05/2018  Component Date Value Ref Range Status  . WBC 01/05/2018 6.9  4.0 - 10.5 K/uL Final  . RBC 01/05/2018 3.88  3.87 - 5.11 MIL/uL Final   . Hemoglobin 01/05/2018 11.2* 12.0 - 15.0 g/dL Final  . HCT 01/05/2018 37.1  36.0 - 46.0 % Final  . MCV 01/05/2018 95.6  78.0 - 100.0 fL Final  . MCH 01/05/2018 28.9  26.0 - 34.0 pg Final  . MCHC 01/05/2018 30.2  30.0 - 36.0 g/dL Final  . RDW 01/05/2018 14.7  11.5 - 15.5 % Final  . Platelets 01/05/2018 189  150 - 400 K/uL Final  . Neutrophils Relative % 01/05/2018 62  % Final  . Neutro Abs 01/05/2018 4.3  1.7 - 7.7 K/uL Final  . Lymphocytes Relative 01/05/2018 27  % Final  . Lymphs Abs 01/05/2018 1.9  0.7 - 4.0 K/uL Final  . Monocytes Relative 01/05/2018 8  % Final  . Monocytes Absolute 01/05/2018 0.5  0.1 - 1.0 K/uL Final  . Eosinophils Relative 01/05/2018 3  % Final  . Eosinophils Absolute 01/05/2018 0.2  0.0 - 0.7 K/uL Final  . Basophils Relative 01/05/2018 0  % Final  . Basophils Absolute 01/05/2018 0.0  0.0 - 0.1 K/uL Final   Performed at [facility], [address], [place], [place] [address]  . Sodium 01/05/2018 136  135 - 145 mmol/L Final  . Potassium 01/05/2018 4.8  3.5 - 5.1 mmol/L Final  . Chloride 01/05/2018 101  101 - 111 mmol/L Final  . CO2 01/05/2018 22  22 - 32 mmol/L Final  . Glucose, Bld 01/05/2018 111* 65 - 99 mg/dL Final  . BUN 01/05/2018 13  6 - 20 mg/dL Final  .  Creatinine, Ser 01/05/2018 0.74  0.44 - 1.00 mg/dL Final  . Calcium 01/05/2018 9.0  8.9 - 10.3 mg/dL Final  . Total Protein 01/05/2018 7.2  6.5 - 8.1 g/dL Final  . Albumin 01/05/2018 3.8  3.5 - 5.0 g/dL Final  . AST 01/05/2018 30  15 - 41 U/L Final  . ALT 01/05/2018 21  14 - 54 U/L Final  . Alkaline Phosphatase 01/05/2018 64  38 - 126 U/L Final  . Total Bilirubin 01/05/2018 0.6  0.3 - 1.2 mg/dL Final  . GFR calc non Af Amer 01/05/2018 >60  >60 mL/min Final  . GFR calc Af Amer 01/05/2018 >60  >60 mL/min Final   Comment: (NOTE) The eGFR has been calculated using the CKD EPI equation. This calculation has not been validated in all clinical situations. eGFR's persistently <60 mL/min signify possible  Chronic Kidney Disease.   Georgiann Hahn gap 01/05/2018 13  5 - 15 Final   Performed at [facility], [address], [place], [place] [address]  . CEA 01/05/2018 3.8  0.0 - 4.7 ng/mL Final   Comment: (NOTE)                             Nonsmokers          <3.9                             Smokers             <5.6 [employer] Electrochemiluminescence Immunoassay (ECLIA) Values obtained with different assay methods or kits cannot be used interchangeably.  Results cannot be interpreted as absolute evidence of the presence or absence of malignant disease. Performed At: [facility] [place], [place] [id number] Rush Farmer MD [id number] Performed at [facility], [address] [place], [place] [address]      Pathology Orders Placed This Encounter  Procedures  . CT Abdomen Pelvis W Contrast    Standing Status:   Future    Standing Expiration Date:   01/05/2019    Order Specific Question:   If indicated for the ordered procedure, I authorize the administration of contrast media per Radiology protocol    Answer:   Yes    Order Specific Question:   Preferred imaging location?    Answer:   [facility]    Order Specific Question:   Radiology Contrast Protocol - do NOT remove file path    Answer:   [email or web address]  . CT Chest W Contrast    Standing Status:   Future    Standing Expiration Date:   01/05/2019    Order Specific Question:   If indicated for the ordered procedure, I authorize the administration of contrast media per Radiology protocol    Answer:   Yes    Order Specific Question:   Preferred imaging location?    Answer:   [facility]    Order Specific Question:   Radiology Contrast Protocol - do NOT remove file path    Answer:   [email or web address]       Zoila Shutter MD

## 2018-02-11 ENCOUNTER — Other Ambulatory Visit (HOSPITAL_COMMUNITY): Payer: Self-pay | Admitting: Emergency Medicine

## 2018-02-11 ENCOUNTER — Ambulatory Visit (HOSPITAL_COMMUNITY)
Admission: RE | Admit: 2018-02-11 | Discharge: 2018-02-11 | Disposition: A | Discharge status: Home / Self Care | Payer: Medicare Other | Source: Ambulatory Visit | Attending: Internal Medicine | Admitting: Internal Medicine

## 2018-02-11 DIAGNOSIS — C189 Malignant neoplasm of colon, unspecified: Secondary | ICD-10-CM | POA: Insufficient documentation

## 2018-02-11 DIAGNOSIS — C787 Secondary malignant neoplasm of liver and intrahepatic bile duct: Secondary | ICD-10-CM | POA: Insufficient documentation

## 2018-02-11 MED ORDER — IOPAMIDOL (ISOVUE-300) INJECTION 61%
100.0000 mL | Freq: Once | INTRAVENOUS | Status: AC | PRN
  Administered 2018-02-11: 100 mL via INTRAVENOUS

## 2018-02-11 MED ORDER — GABAPENTIN 300 MG PO CAPS: ORAL_CAPSULE | ORAL | 1 refills | Status: DC

## 2018-02-16 ENCOUNTER — Encounter (HOSPITAL_COMMUNITY): Payer: Self-pay | Admitting: Hematology

## 2018-02-16 ENCOUNTER — Inpatient Hospital Stay (HOSPITAL_COMMUNITY): Payer: Medicare Other | Attending: Internal Medicine | Admitting: Hematology

## 2018-02-16 ENCOUNTER — Inpatient Hospital Stay (HOSPITAL_COMMUNITY): Payer: Medicare Other

## 2018-02-16 VITALS — BP 135/86 | HR 103 | Temp 98.5°F | Resp 18 | Wt 171.0 lb

## 2018-02-16 DIAGNOSIS — C182 Malignant neoplasm of ascending colon: Secondary | ICD-10-CM | POA: Insufficient documentation

## 2018-02-16 DIAGNOSIS — G620 Drug-induced polyneuropathy: Secondary | ICD-10-CM | POA: Diagnosis not present

## 2018-02-16 DIAGNOSIS — C787 Secondary malignant neoplasm of liver and intrahepatic bile duct: Secondary | ICD-10-CM | POA: Diagnosis not present

## 2018-02-16 MED ORDER — SODIUM CHLORIDE 0.9% FLUSH
10.0000 mL | INTRAVENOUS | Status: DC | PRN
  Administered 2018-02-16: 10 mL via INTRAVENOUS
  Filled 2018-02-16: qty 10

## 2018-02-16 MED ORDER — HEPARIN SOD (PORK) LOCK FLUSH 100 UNIT/ML IV SOLN
500.0000 [IU] | Freq: Once | INTRAVENOUS | Status: AC
  Administered 2018-02-16: 500 [IU] via INTRAVENOUS

## 2018-02-16 NOTE — Progress Notes (Signed)
Ralphine D Surgeon tolerated portacath flush well without complaints or incident. Port accessed with 20 gauge needle with blood return noted then flushed with 10 ml NS and 5 ml Heparin easily per protocol then de-accessed. Pt discharged via wheelchair in satisfactory condition accompanied by family member

## 2018-02-16 NOTE — Assessment & Plan Note (Signed)
1.  Stage IV colon cancer with liver metastasis: MS-Stable, RAS wild type - Status post right partial colectomy in 2017 for at least stage colon cancer, received no adjuvant chemotherapy -Developed liver metastasis in February 2018, biopsy-proven -FOLFOX started on 14 2018, Avastin added with cycle 3, status post 12 cycles completed on 08/11/2017 -Status post left lateral segmentectomy of liver on 12/03/2017, 3.2 cm adenocarcinoma, margins negative - I have reviewed the results of the most recent CT scan of the chest, abdomen and pelvis with the patient and daughter in detail.  There is no clear evidence of metastatic disease.  Her CEA was 3.8.  There is mild dilatation of the distal right ureter which is new.  But it is nonspecific.  I have recommended repeat CT scans in 3 months with a repeat CEA level.  She is agreeable to this plan.  She was told to come back sooner if she develops any abdominal symptoms.  2.  Peripheral neuropathy: She has burning and stinging sensation in the feet.  She is taking gabapentin 1 tablet in the morning, 1 in the afternoon and 2 at bedtime.  She is able to sleep well at night.  However the pain gets worse during daytime.  There is mild numbness in the fingertips which is not bothering her.  I have suggested her to increase gabapentin to 2-2-2.  If this works we will send her another prescription.  She will call us back in 10 days.

## 2018-02-16 NOTE — Patient Instructions (Signed)
[facility] at [facility] Discharge Instructions  Portacath flushed per protocol today. Follow-up as scheduled. Call clinic for any questions or concerns   Thank you for choosing [facility] at [facility] to provide your oncology and hematology care.  To afford each patient quality time with our provider, please arrive at least 15 minutes before your scheduled appointment time.   If you have a lab appointment with the Cancer Center please come in thru the  Main Entrance and check in at the main information desk  You need to re-schedule your appointment should you arrive 10 or more minutes late.  We strive to give you quality time with our providers, and arriving late affects you and other patients whose appointments are after yours.  Also, if you no show three or more times for appointments you may be dismissed from the clinic at the providers discretion.     Again, thank you for choosing [facility].  Our hope is that these requests will decrease the amount of time that you wait before being seen by our physicians.       _____________________________________________________________  Should you have questions after your visit to [facility], please contact our office at [phone] between the hours of 8:30 a.m. and 4:30 p.m.  Voicemails left after 4:30 p.m. will not be returned until the following business day.  For prescription refill requests, have your pharmacy contact our office.       Resources For Cancer Patients and their Caregivers ? American Cancer Society: Can assist with transportation, wigs, general needs, runs Look Good Feel Better.        1-888-227-6333 ? Cancer Care: Provides financial assistance, online support groups, medication/co-pay assistance.  1-800-813-HOPE (4673) ? [facility] Assists [place] cancer patients and their families through emotional , educational and  financial support.  336-427-4357 ? [place] DSS Where to apply for food stamps, Medicaid and utility assistance. 336-342-1394 ? [facility]: Transportation to medical appointments. 336-347-2287 ? Social Security Administration: May apply for disability if have a Stage IV cancer. 336-342-7796 1-800-772-1213 ? [place] Aging, Disability and Transit Services: Assists with nutrition, care and transit needs. 336-349-2343  Cancer Center Support Programs:   > Cancer Support Group  2nd Tuesday of the month 1pm-2pm, Journey Room   > Creative Journey  3rd Tuesday of the month 1130am-1pm, Journey Room     

## 2018-02-16 NOTE — Progress Notes (Signed)
[place], [place] [address]   CLINIC:  Medical Oncology/Hematology  PCP:  Tempie Hoist, [place] [id number] [phone]   REASON FOR VISIT:  Follow-up for metastatic colon cancer.  CURRENT THERAPY: Active surveillance.  BRIEF ONCOLOGIC HISTORY:    Cancer of right colon ([place])   06/24/2016 Imaging    CT C/A/P There is a large annular constricting mass involving the mid aspect of the ascending colon most compatible with colonic carcinoma. There is a small adjacent foci of gas and free fluid raising the possibility of micro perforation. There is marked dilatation of the cecum and small bowel with fluid and gas most compatible with proximal obstruction caused by the large ascending colonic mass. Additionally there is a 6 cm fatty mass involving the colon at the level the hepatic flexure, most suggestive of a lipoma. Aortic vascular calcifications. Sigmoid colonic diverticulosis without evidence for acute diverticulitis.No specific findings identified to suggest metastatic disease to the chest.There is a 3 mm nodule identified within the left upper lobe, nonspecific.Bilateral pleural effusions with overlying compressive type atelectasis and consolidation       06/26/2016 Tumor Marker    CEA 39.3 ng/ml      06/27/2016 Surgery    Partial colectomy with primary linear stapled anastomosis.  Large bulky mid-ascending colonic mass extending into the colonic mesentery and retroperitoneum with a layer of not visibly/palpably involved fat between the colon and underlying visualized and exposed duodenum and Right kidney      06/27/2016 Pathology Results    COLONIC ADENOCARCINOMA (8.5 CM), GRADE 2 THE TUMOR INVADES THROUGH THE MUSCULARIS PROPRIA INTO PERICOLONIC TISSUE  ALL MARGINS OF RESECTION ARE NEGATIVE FOR CARCINOMA FOURTEEN BENIGN LYMPH NODES (0/14) TUBULAR ADENOMA (X3) UNREMARKABLE APPENDIX      06/30/2016 Imaging    CT angio chest Positive examination for right upper lobe and right lower lobe pulmonary emboli. No evidence of right heart strain. Again demonstrated are bilateral pleural effusions and basilar atelectasis, greater on the right.      07/30/2016 Tumor Marker      Ref. Range 07/30/2016 16:46  CEA Latest Ref Range: 0.0 - 4.7 ng/mL 4.0        07/30/2016 Miscellaneous    Medical Oncology consultation.  No adjuvant chemotherapy needed based upon risk factors and small benefit.  Informed decision made by patient to not pursue systemic chemotherapy in adjuvant setting.      11/05/2016 Procedure    Colonoscopy by Dr. Laural Golden.  soft anal tags found on digital rectal exam. - Patent end-to-side colo-colonic anastomosis, characterized by healthy appearing mucosa. - Three 5 to 7 mm polyps in the proximal transverse colon, removed with a cold snare. Resected and retrieved. - One 10 mm polyp in the proximal transverse colon, removed with a hot snare. Resected and retrieved. - One 20 mm polyp in the proximal sigmoid colon, removed with a hot snare. Resected and retrieved. Clip (MR conditional) was placed. - One 15 to 25 mm polyp at the recto-sigmoid colon, removed with a hot snare. Resected and retrieved. Clip (MR conditional) was placed. - Diverticulosis in the sigmoid colon and in the descending colon. - The distal rectum and anal verge are normal on retroflexion view.      11/07/2016 Pathology Results    1. Colon, polyp(s), transverse proximal - TUBULAR ADENOMA(S). - HIGH GRADE DYSPLASIA IS NOT IDENTIFIED. 2. Colon, polyp(s), proximal sigmoid - TUBULOVILLOUS ADENOMA WITH FOCAL HIGH GRADE  DYSPLASIA (5%). 3. Rectosigmoid , polyp - TUBULOVILLOUS ADENOMA WITH HIGH GRADE DYSPLASIA (15%).      12/26/2016 Imaging    CT abd/pelvis-1. Interval development of a 2.4 cm rim enhancing lesion in the lateral segment left liver, highly suspicious for metastatic disease. 2. New abnormal  irregular soft tissue in the cul-de-sac, involving the anterior wall of the rectum and posterior cervix. This is highly suspicious for metastatic involvement (drop lesion). 3. Interval Right hemicolectomy. 4.  Abdominal Aortic Atherosclerois (ICD10-170.0)      01/16/2017 Procedure    IR biopsy of hepatic lesion      01/19/2017 Pathology Results    Liver, needle/core biopsy, Left Lobe - ADENOCARCINOMA Microscopic Comment The morphology is consistent with metastatic colorectal adenocarcinoma. There is sufficient tissue for additional studies. Called to [facility] on 01/19/17      02/06/2017 Imaging    CT chest- 1. Stable CT chest. No substantial change in the 7 mm short axis left paraesophageal lymph node. 2. Rim enhancing lesion lateral segment left liver has progressed in the interval      02/09/2017 Procedure    Port-A-Cath insertion by Dr. Arnoldo Morale      02/10/2017 -  Chemotherapy    The patient had palonosetron (ALOXI) injection 0.25 mg, 0.25 mg, Intravenous,  Once, 1 of 4 cycles  leucovorin 800 mg in dextrose 5 % 250 mL infusion, 404 mg/m2 = 792 mg, Intravenous,  Once, 1 of 4 cycles  oxaliplatin (ELOXATIN) 170 mg in dextrose 5 % 500 mL chemo infusion, 85 mg/m2 = 170 mg, Intravenous,  Once, 1 of 4 cycles  fluorouracil (ADRUCIL) chemo injection 800 mg, 400 mg/m2 = 800 mg, Intravenous,  Once, 1 of 4 cycles  fluorouracil (ADRUCIL) 4,750 mg in sodium chloride 0.9 % 55 mL chemo infusion, 2,400 mg/m2 = 4,750 mg, Intravenous, 1 Day/Dose, 1 of 4 cycles  for chemotherapy treatment.        03/11/2017 Pathology Results    FoundationONE- KRAS wildtype, NRAS wildtype, MS stable, TMB 6 Muts/Mb, APC G956*, APC O130*, TERT promoter-124C>T, TP53 LOSS.  Therapies with clinical benefit include cetuximab, Panitumumab.      05/01/2017 Imaging    CT C/A/P: 1. The left hepatic metastasis is smaller in the interval. The soft tissue between the anterior wall of the rectum and the posterior uterus  is concerning for a drop metastasis, similar in the interval. No other metastatic disease is identified. 2. The ascending thoracic aorta measures 4.1 cm. Ascending thoracic aortic aneurysm. Recommend semi-annual imaging followup by CTA or MRA and referral to cardiothoracic surgery if not already obtained. This recommendation follows 2010 ACCF/AHA/AATS/ACR/ASA/SCA/SCAI/SIR/STS/SVM Guidelines for the Diagnosis and Management of Patients With Thoracic Aortic Disease. Circulation. 2010; 121: Q657-Q469 3. Atherosclerosis in the aorta. 4. Coronary artery calcifications.      09/04/2017 Imaging    CT C/A/P: Mild decreased size of small left hepatic lobe metastasis since previous study. No other sites of metastatic disease identified.  New benign appearing L1, L3, and L5 vertebral body compression fractures since 05/01/2017 exam.  Colonic diverticulosis, without radiographic evidence of diverticulitis.        CANCER STAGING: Cancer Staging Cancer of right colon [facility]) Staging form: Colon and Rectum, AJCC 7th Edition - Pathologic stage from 07/02/2016: Stage IVB (T3, N0, M1b) - Signed by Twana First, MD on 02/10/2017    INTERVAL HISTORY:  Ms. Shadowens 72 y.o. female returns for routine follow-up of her metastatic colon cancer to the liver.  She had metastasectomy on 12/03/2017.  She has recovered very well.  Her appetite has been great.  She denies any nausea, vomiting or diarrhea.  She denies any abdominal pains.  Her only complaint is burning and stinging sensation in the feet predominantly at daytime.  She is taking gabapentin 1-1-2.  Numbness is very mild in the fingers.  Mild fatigue is also improving.  Denies any change in bowel habits.  Denies any bleeding per rectum or melena.   REVIEW OF SYSTEMS:  Review of Systems  Constitutional: Positive for fatigue.  HENT:  Negative.   Eyes: Negative.   Respiratory: Negative.   Cardiovascular: Negative.   Gastrointestinal: Negative.    Genitourinary: Negative.    Musculoskeletal: Negative.   Skin: Negative.   Neurological: Positive for numbness.  Hematological: Negative.   Psychiatric/Behavioral: Negative.      PAST MEDICAL/SURGICAL HISTORY:  Past Medical History:  Diagnosis Date  . Anxiety   . Arthritis   . Cancer of right colon ([place]) 07/30/2016  . Colon cancer ([place])   . COPD (chronic obstructive pulmonary disease) ([place])    just quite smoking 2015  . Depression   . Diabetes mellitus without complication ([facility])    was at one time, she has since lost weight.  . FH: cataracts    left eye  . Hypercholesterolemia   . Hypertension   . Iron deficiency anemia due to chronic blood loss 07/30/2016  . Neuropathy    possibly from chemo (all her extrem, tho her fingers are better)  . PE (pulmonary thromboembolism) ([place])    x 2 in 2017  . Vitamin B12 deficiency 08/13/2016   Past Surgical History:  Procedure Laterality Date  . COLON SURGERY    . COLONOSCOPY N/A 11/05/2016   Procedure: COLONOSCOPY;  Surgeon: Rogene Houston, MD;  Location: [facility];  Service: Endoscopy;  Laterality: N/A;  1:20  . HERNIA REPAIR    . LAPAROSCOPIC INCISIONAL / UMBILICAL / VENTRAL HERNIA REPAIR  12/03/2017   VHR  . LAPAROSCOPIC PARTIAL HEPATECTOMY  12/03/2017   HAND ASSISTED LAPAROSCOPIC PARTIAL HEPATECTOMY  . LAPAROSCOPIC PARTIAL HEPATECTOMY N/A 12/03/2017   Procedure: HAND ASSISTED LAPAROSCOPIC PARTIAL HEPATECTOMY;  Surgeon: Stark Klein, MD;  Location: [facility];  Service: General;  Laterality: N/A;  . PARTIAL COLECTOMY N/A 06/27/2016   Procedure: PARTIAL COLECTOMY;  Surgeon: Vickie Epley, MD;  Location: [facility];  Service: General;  Laterality: N/A;  . PORTACATH PLACEMENT Right 02/09/2017   Procedure: INSERTION PORT-A-CATH;  Surgeon: Aviva Signs, MD;  Location: [facility];  Service: General;  Laterality: Right;  right subclavian  . VENTRAL HERNIA REPAIR N/A 12/03/2017   Procedure: LAPAROSCOPIC VENTRAL HERNIA REPAIR;  Surgeon: Stark Klein, MD;  Location: [facility];  Service: General;  Laterality: N/A;     SOCIAL HISTORY:  Social History   Socioeconomic History  . Marital status: Widowed    Spouse name: Not on file  . Number of children: Not on file  . Years of education: Not on file  . Highest education level: Not on file  Occupational History  . Not on file  Social Needs  . Financial resource strain: Not on file  . Food insecurity:    Worry: Not on file    Inability: Not on file  . Transportation needs:    Medical: Not on file    Non-medical: Not on file  Tobacco Use  . Smoking status: Former Smoker    Packs/day: 0.50    Years: 48.00    Pack years: 24.00  Types: Cigarettes    Last attempt to quit: 06/24/2016    Years since quitting: 1.6  . Smokeless tobacco: Never Used  . Tobacco comment: quit smoking Sept 2017  Substance and Sexual Activity  . Alcohol use: No    Comment: glass wine  twice per year  . Drug use: No  . Sexual activity: Never    Birth control/protection: None    Comment: widowed- 1 daughter  Lifestyle  . Physical activity:    Days per week: Not on file    Minutes per session: Not on file  . Stress: Not on file  Relationships  . Social connections:    Talks on phone: Not on file    Gets together: Not on file    Attends religious service: Not on file    Active member of club or organization: Not on file    Attends meetings of clubs or organizations: Not on file    Relationship status: Not on file  . Intimate partner violence:    Fear of current or ex partner: Not on file    Emotionally abused: Not on file    Physically abused: Not on file    Forced sexual activity: Not on file  Other Topics Concern  . Not on file  Social History Narrative  . Not on file    FAMILY HISTORY:  Family History  Problem Relation Age of Onset  . Hypertension Mother   . Other Father        some type of "stomach ailment"  . Suicidality Brother   . Stomach cancer Maternal Aunt   . Brain  cancer Brother     CURRENT MEDICATIONS:  Outpatient Encounter Medications as of 02/16/2018  Medication Sig  . acetaminophen (TYLENOL) 500 MG tablet Take 500 mg by mouth every 6 (six) hours as needed (for pain.).  Marland Kitchen amLODipine (NORVASC) 5 MG tablet Take 5 mg by mouth every evening.   . busPIRone (BUSPAR) 15 MG tablet Take 15 mg by mouth 2 (two) times daily.   . carvedilol (COREG) 3.125 MG tablet Take 3.125 mg by mouth daily.   Marland Kitchen escitalopram (LEXAPRO) 20 MG tablet Take 20 mg by mouth daily.   Marland Kitchen gabapentin (NEURONTIN) 300 MG capsule Take 300 mg in AM and at lunch.  Take 600 mg at bedtime.  Marland Kitchen HYDROcodone-acetaminophen (NORCO) 5-325 MG tablet Take 1 tablet by mouth every 6 (six) hours as needed for moderate pain.  Marland Kitchen lidocaine-prilocaine (EMLA) cream Apply to affected area once  . loperamide (IMODIUM) 2 MG capsule Take 2-4 mg by mouth 4 (four) times daily as needed for diarrhea or loose stools.   . metFORMIN (GLUCOPHAGE) 500 MG tablet Take 500 mg by mouth daily with breakfast.   . Omega-3 Fatty Acids (FISH OIL) 1200 MG CAPS Take 1,200 mg by mouth daily.   . simvastatin (ZOCOR) 40 MG tablet Take 40 mg by mouth daily.  . vitamin B-12 (CYANOCOBALAMIN) 1000 MCG tablet Take 1,000 mcg by mouth daily.  . vitamin C (ASCORBIC ACID) 500 MG tablet Take 500 mg by mouth daily.  Marland Kitchen warfarin (COUMADIN) 5 MG tablet Take 5-7.5 mg by mouth See admin instructions. Take 1 tablet (5 mg) by mouth every day, except take 1.5 tablets (7.5 mg) by mouth on Tuesdays and Fridays.  Marland Kitchen warfarin (COUMADIN) 7.5 MG tablet   . zolpidem (AMBIEN) 10 MG tablet   . dexamethasone (DECADRON) 4 MG tablet Take 2 tablets (8 mg total) by mouth daily. Start the day after  chemotherapy for 2 days. Take with food. (Patient not taking: Reported on 11/23/2017)  . meclizine (ANTIVERT) 32 MG tablet Take 1 tablet (32 mg total) by mouth 3 (three) times daily as needed. (Patient not taking: Reported on 11/23/2017)  . ondansetron (ZOFRAN) 8 MG tablet Take 1  tablet (8 mg total) by mouth 2 (two) times daily as needed for refractory nausea / vomiting. Start on day 3 after chemotherapy. (Patient not taking: Reported on 11/23/2017)  . prochlorperazine (COMPAZINE) 10 MG tablet Take 1 tablet (10 mg total) by mouth every 6 (six) hours as needed (Nausea or vomiting). (Patient not taking: Reported on 11/23/2017)  . [DISCONTINUED] zolpidem (AMBIEN) 5 MG tablet Take 10 mg by mouth at bedtime.   No facility-administered encounter medications on file as of 02/16/2018.     ALLERGIES:  No Known Allergies   PHYSICAL EXAM:  ECOG Performance status: 1  Vitals:   02/16/18 1544  BP: 135/86  Pulse: (!) 103  Resp: 18  Temp: 98.5 F (36.9 C)   Filed Weights   02/16/18 1544  Weight: 171 lb (77.6 kg)    Physical Exam  Constitutional: She appears well-developed and well-nourished.  Psychiatric: She has a normal mood and affect. Her behavior is normal. Judgment and thought content normal.     LABORATORY DATA:  I have reviewed the labs as listed.  CBC    Component Value Date/Time   WBC 6.9 01/05/2018 1412   RBC 3.88 01/05/2018 1412   HGB 11.2 (L) 01/05/2018 1412   HCT 37.1 01/05/2018 1412   PLT 189 01/05/2018 1412   MCV 95.6 01/05/2018 1412   MCH 28.9 01/05/2018 1412   MCHC 30.2 01/05/2018 1412   RDW 14.7 01/05/2018 1412   LYMPHSABS 1.9 01/05/2018 1412   MONOABS 0.5 01/05/2018 1412   EOSABS 0.2 01/05/2018 1412   BASOSABS 0.0 01/05/2018 1412   CMP Latest Ref Rng & Units 01/05/2018 12/08/2017 12/07/2017  Glucose 65 - 99 mg/dL 111(H) 96 111(H)  BUN 6 - 20 mg/dL _0 Creatinine 0.44 - 1.00 mg/dL 0.74 0.69 0.85  Sodium 135 - 145 mmol/L 136 139 138  Potassium 3.5 - 5.1 mmol/L 4.8 4.0 4.2  Chloride 101 - 111 mmol/L 101 101 102  CO2 22 - 32 mmol/L _1 Calcium 8.9 - 10.3 mg/dL 9.0 8.7(L) 8.7(L)  Total Protein 6.5 - 8.1 g/dL 7.2 - 5.8(L)  Total Bilirubin 0.3 - 1.2 mg/dL 0.6 - 0.9  Alkaline Phos 38 - 126 U/L 64 - 64  AST 15 - 41 U/L 30 - 52(H)   ALT 14 - 54 U/L 21 - 43       DIAGNOSTIC IMAGING:   I have reviewed CT scans of the chest, abdomen and pelvis independently.   ASSESSMENT & PLAN:   Cancer of right colon ([place]) 1.  Stage IV colon cancer with liver metastasis: MS-Stable, RAS wild type - Status post right partial colectomy in 2017 for at least stage colon cancer, received no adjuvant chemotherapy -Developed liver metastasis in February 2018, biopsy-proven -FOLFOX started on 14 2018, Avastin added with cycle 3, status post 12 cycles completed on 08/11/2017 -Status post left lateral segmentectomy of liver on 12/03/2017, 3.2 cm adenocarcinoma, margins negative - I have reviewed the results of the most recent CT scan of the chest, abdomen and pelvis with the patient and daughter in detail.  There is no clear evidence of metastatic disease.  Her CEA was 3.8.  There is mild dilatation of  the distal right ureter which is new.  But it is nonspecific.  I have recommended repeat CT scans in 3 months with a repeat CEA level.  She is agreeable to this plan.  She was told to come back sooner if she develops any abdominal symptoms.  2.  Peripheral neuropathy: She has burning and stinging sensation in the feet.  She is taking gabapentin 1 tablet in the morning, 1 in the afternoon and 2 at bedtime.  She is able to sleep well at night.  However the pain gets worse during daytime.  There is mild numbness in the fingertips which is not bothering her.  I have suggested her to increase gabapentin to 2-2-2.  If this works we will send her another prescription.  She will call us back in 10 days.  Total time spent is 25 minutes with more than 50% of the time spent face-to-face discussing scan results, active surveillance.    Orders placed this encounter:  Orders Placed This Encounter  Procedures  . CT Abdomen Pelvis W Contrast  . CT Chest W Contrast  . CEA  . Comprehensive metabolic panel  . CBC with Differential      Derek Jack,  MD [place] [phone]

## 2018-02-18 ENCOUNTER — Ambulatory Visit (INDEPENDENT_AMBULATORY_CARE_PROVIDER_SITE_OTHER): Payer: Medicare Other | Admitting: Internal Medicine

## 2018-02-19 ENCOUNTER — Ambulatory Visit (INDEPENDENT_AMBULATORY_CARE_PROVIDER_SITE_OTHER): Payer: Medicare Other | Admitting: Internal Medicine

## 2018-02-23 ENCOUNTER — Ambulatory Visit (INDEPENDENT_AMBULATORY_CARE_PROVIDER_SITE_OTHER): Payer: Medicare Other | Admitting: Internal Medicine

## 2018-03-08 ENCOUNTER — Other Ambulatory Visit (HOSPITAL_COMMUNITY): Payer: Self-pay | Admitting: Emergency Medicine

## 2018-03-08 MED ORDER — GABAPENTIN 300 MG PO CAPS: ORAL_CAPSULE | ORAL | 1 refills | Status: DC

## 2018-03-08 NOTE — Progress Notes (Signed)
New prescription for gabapentin sent in to [facility] in [place], take 600 mg in am, at lunch, and at bedtime.

## 2018-05-20 ENCOUNTER — Inpatient Hospital Stay (HOSPITAL_COMMUNITY): Payer: Medicare Other

## 2018-05-24 ENCOUNTER — Ambulatory Visit (HOSPITAL_COMMUNITY): Payer: Medicare Other

## 2018-05-27 ENCOUNTER — Ambulatory Visit (HOSPITAL_COMMUNITY): Payer: Medicare Other | Admitting: Internal Medicine

## 2018-08-15 ENCOUNTER — Other Ambulatory Visit (HOSPITAL_COMMUNITY): Payer: Self-pay | Admitting: Hematology

## 2018-08-17 ENCOUNTER — Other Ambulatory Visit (HOSPITAL_COMMUNITY): Payer: Self-pay | Admitting: *Deleted

## 2018-08-17 MED ORDER — GABAPENTIN 300 MG PO CAPS: ORAL_CAPSULE | ORAL | 1 refills | Status: DC

## 2018-10-23 ENCOUNTER — Other Ambulatory Visit (HOSPITAL_COMMUNITY): Payer: Self-pay | Admitting: Nurse Practitioner

## 2018-11-24 ENCOUNTER — Other Ambulatory Visit (HOSPITAL_COMMUNITY): Payer: Self-pay | Admitting: Nurse Practitioner

## 2019-12-28 IMAGING — MR MR ABDOMEN WO/W CM
6 of 12 series · 20 of 48 positions shown · IV contrast (multihance)
Comparison: CT scan 09/04/2017.

CLINICAL DATA: Metastatic colon cancer. Isolated left hepatic
metastatic lesion on previous CT.

EXAM:
MRI ABDOMEN WITHOUT AND WITH CONTRAST
TECHNIQUE: Multiplanar multisequence MR imaging of the abdomen was performed
both before and after the administration of intravenous contrast.
CONTRAST:  15mL MULTIHANCE GADOBENATE DIMEGLUMINE 529 MG/ML IV SOLN

[Series 4: T2 · axial · 5.0mm · 0.86mm/px · z∈[-149,+71]mm · 2 of 45 slices shown (1 of 2)]
[im 1/45]
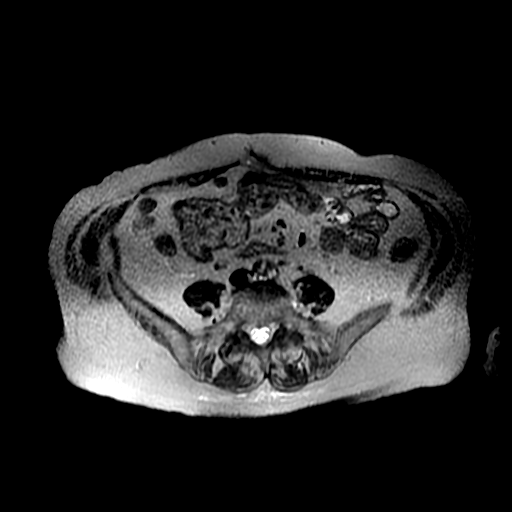
[im 45/45]
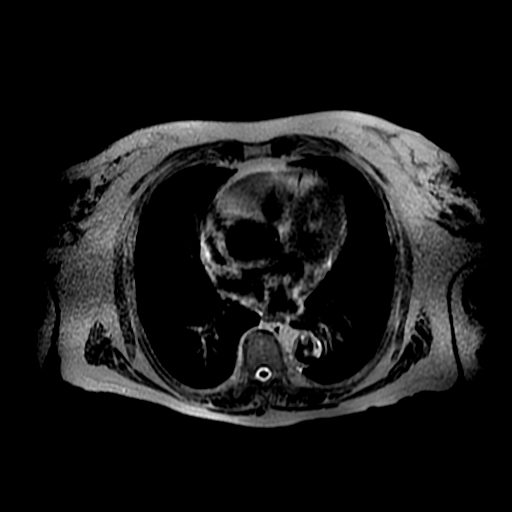

[Series 5: T2 · coronal · 5.0mm · 0.82mm/px · 2 of 47 slices shown (2 of 2)]
[im 1/47]
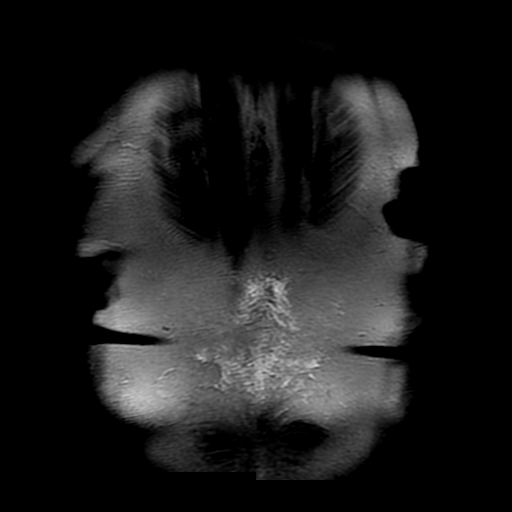
[im 47/47]
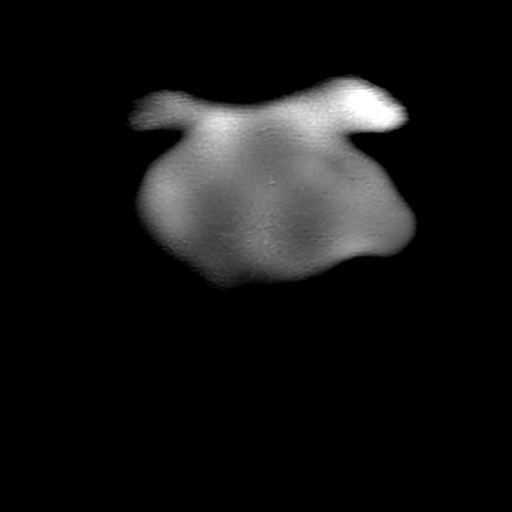

[Series 6: T2 fat-sat · axial · 5.0mm · 0.82mm/px · z∈[-149,+64]mm · 3 of 44 slices shown]
[im 1/44]
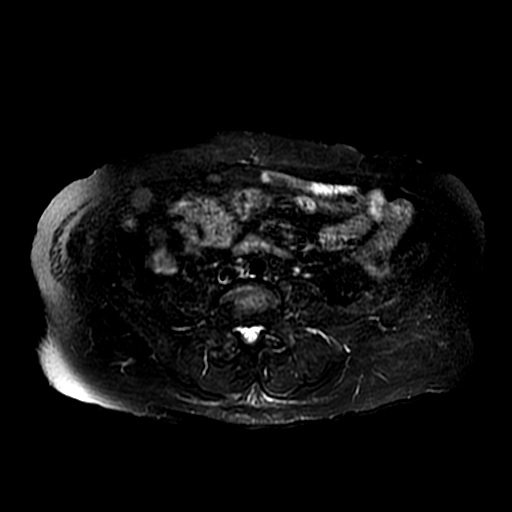
[im 22/44]
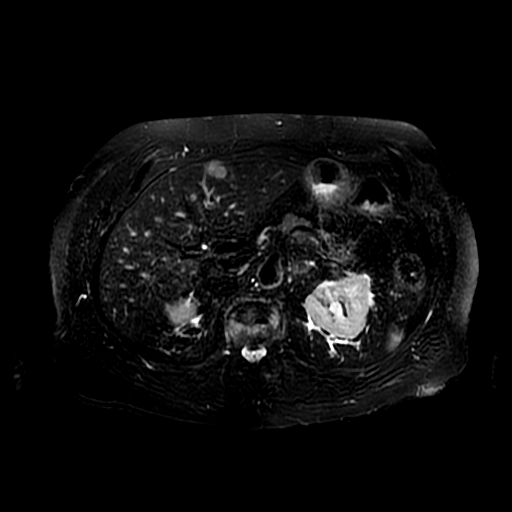
[im 44/44]
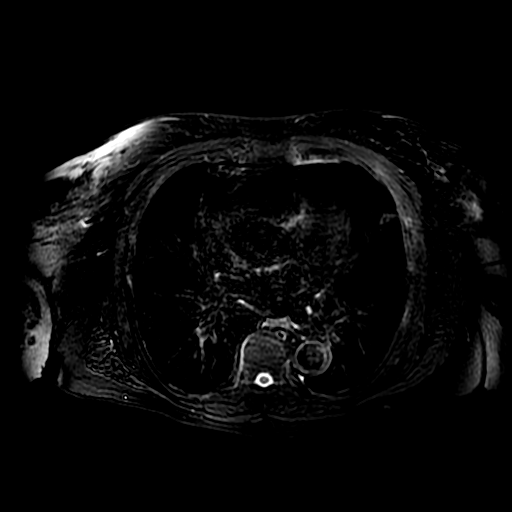

[Series 7: DWI b500 · axial · 6.0mm · 1.64mm/px · z∈[-137,+82]mm · 3 of 58 slices shown]
[im 1/58]
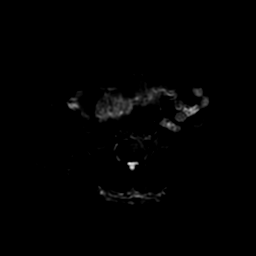
[im 29/58]
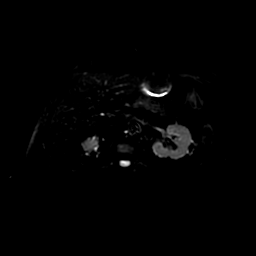
[im 58/58]
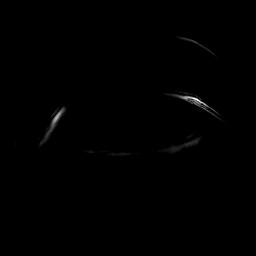

[Series 10: ax dualecho · axial · 5.0mm · 0.84mm/px · z∈[-145,+75]mm · 5 of 90 slices shown]
[im 1/90]
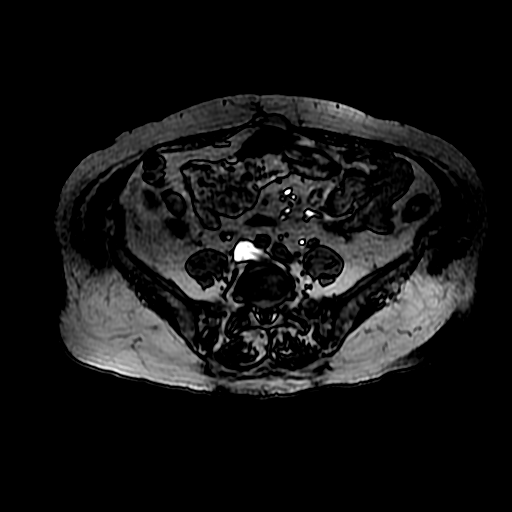
[im 23/90]
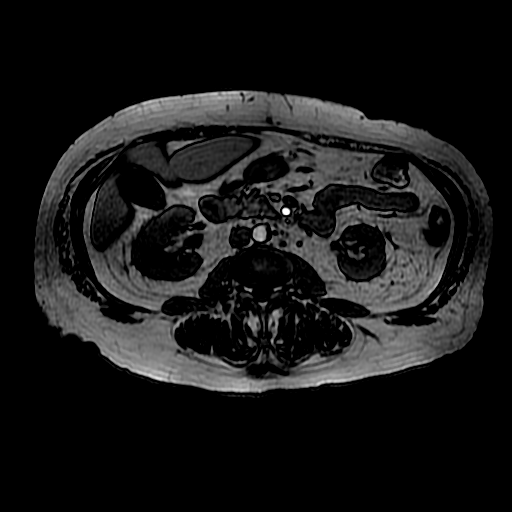
[im 45/90]
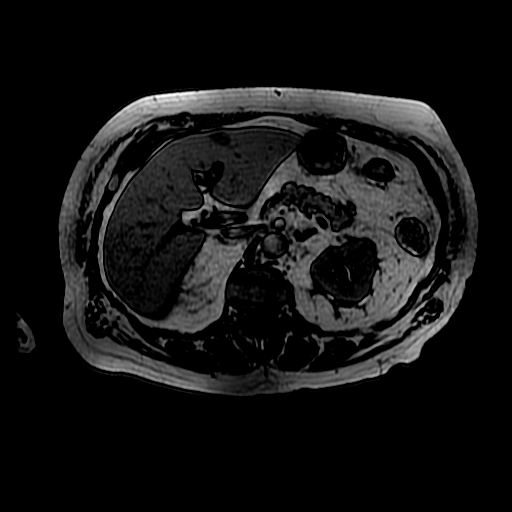
[im 67/90]
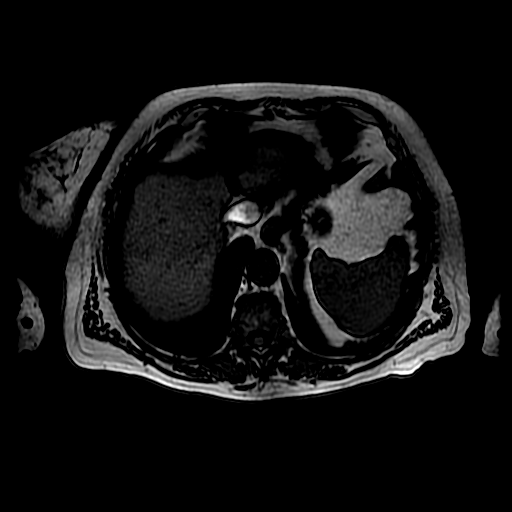
[im 90/90]
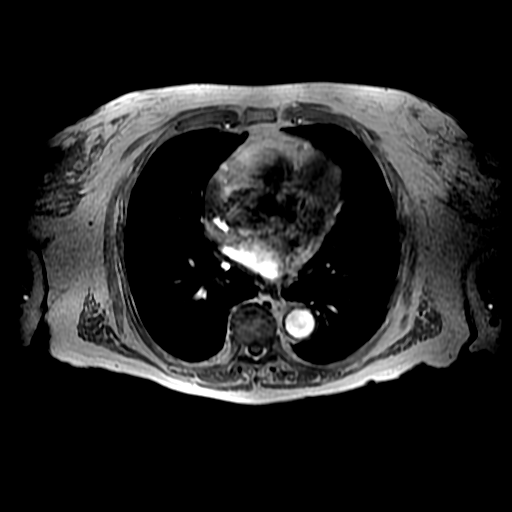

[Series 13: T1 dynamic post-contrast · coronal · 5.0mm · 0.82mm/px · 5 of 96 slices shown]
[im 1/96]
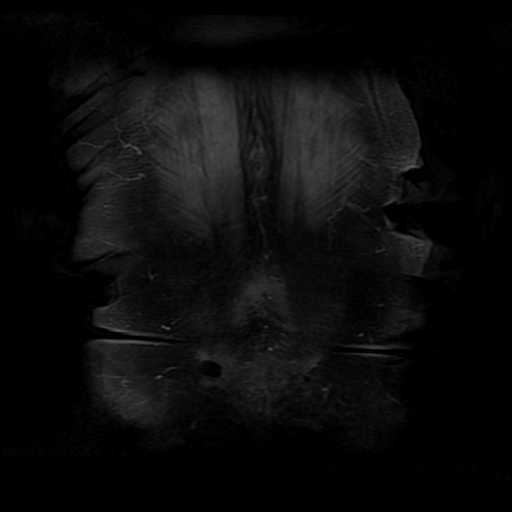
[im 20/96]
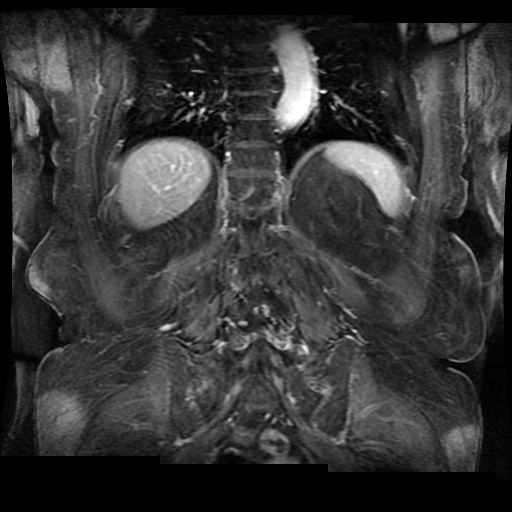
[im 39/96]
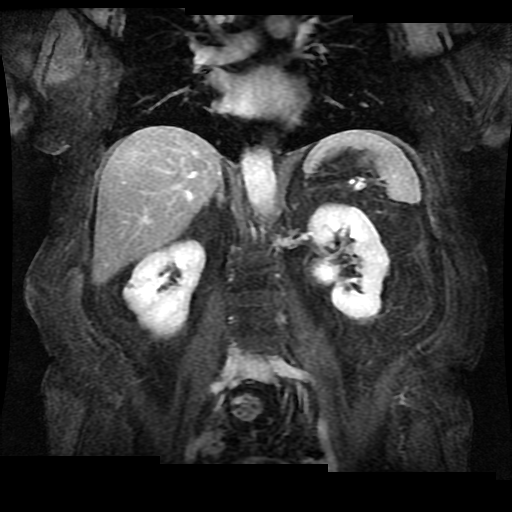
[im 58/96]
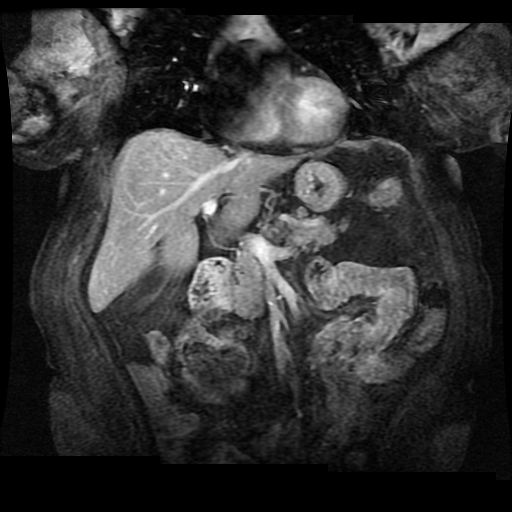
[im 77/96]
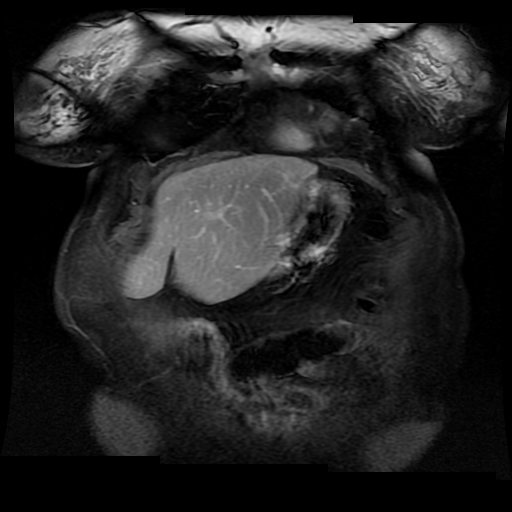

[20 of 48 positions shown; findings below may reference images not displayed]

FINDINGS: Lower chest:  Heart size upper normal.

Hepatobiliary: 2.1 x 2.6 cm heterogeneously enhancing lesion in the
lateral segment of the left liver is compatible with metastatic
lesion. No other enhancing liver lesion to suggest additional
metastatic involvement of the hepatic parenchyma. There is no
evidence for gallstones, gallbladder wall thickening, or
pericholecystic fluid. No intrahepatic or extrahepatic biliary
dilation.

Pancreas: No focal mass lesion. No dilatation of the main duct. No
intraparenchymal cyst. No peripancreatic edema.

Spleen: No splenomegaly. No focal mass lesion.

Adrenals/Urinary Tract: No adrenal nodule or mass. Tiny
hypoenhancing lesions in the right kidney are compatible with cysts.
No suspicious enhancing lesion identified in either kidney. No
hydronephrosis.

Stomach/Bowel: Stomach is nondistended. No gastric wall thickening.
No evidence of outlet obstruction. Duodenum is normally positioned
as is the ligament of Treitz. No small bowel or colonic dilatation
within the visualized abdomen.

Vascular/Lymphatic: No abdominal aortic aneurysm. No abdominal
lymphadenopathy.

Other: No intraperitoneal free fluid.

Musculoskeletal: No abnormal marrow enhancement identified within
the visualized bony anatomy. Cavernous hemangioma identified in the
T12 vertebral body as seen on recent CT scan. L1 compression
deformity again noted.
IMPRESSION: 1. Single 2.6 cm heterogeneously enhancing lesion is identified in
the lateral segment of the left liver and compatible with metastatic
disease. No additional metastatic lesions evident within the liver
parenchyma.
2. No abdominal lymphadenopathy.

## 2021-09-03 DEATH — deceased
